# Patient Record
Sex: Female | Born: 1945 | Race: White | Hispanic: No | Marital: Married | Smoking: Former smoker
Health system: Southern US, Community
[De-identification: ages and names within clinical notes are randomized; demographics above are authoritative.]

## PROBLEM LIST (undated history)

## (undated) ENCOUNTER — Emergency Department (HOSPITAL_COMMUNITY): Admission: EM | Payer: Medicare Other | Source: Home / Self Care

## (undated) DIAGNOSIS — J984 Other disorders of lung: Secondary | ICD-10-CM

## (undated) DIAGNOSIS — M1990 Unspecified osteoarthritis, unspecified site: Secondary | ICD-10-CM

## (undated) DIAGNOSIS — R351 Nocturia: Secondary | ICD-10-CM

## (undated) DIAGNOSIS — Z841 Family history of disorders of kidney and ureter: Secondary | ICD-10-CM

## (undated) DIAGNOSIS — N183 Chronic kidney disease, stage 3 (moderate): Secondary | ICD-10-CM

## (undated) DIAGNOSIS — K5790 Diverticulosis of intestine, part unspecified, without perforation or abscess without bleeding: Secondary | ICD-10-CM

## (undated) DIAGNOSIS — I10 Essential (primary) hypertension: Secondary | ICD-10-CM

## (undated) DIAGNOSIS — K219 Gastro-esophageal reflux disease without esophagitis: Secondary | ICD-10-CM

## (undated) DIAGNOSIS — C3410 Malignant neoplasm of upper lobe, unspecified bronchus or lung: Secondary | ICD-10-CM

## (undated) DIAGNOSIS — Z860100 Personal history of colon polyps, unspecified: Secondary | ICD-10-CM

## (undated) DIAGNOSIS — J90 Pleural effusion, not elsewhere classified: Secondary | ICD-10-CM

## (undated) DIAGNOSIS — R918 Other nonspecific abnormal finding of lung field: Secondary | ICD-10-CM

## (undated) DIAGNOSIS — R42 Dizziness and giddiness: Secondary | ICD-10-CM

## (undated) DIAGNOSIS — E785 Hyperlipidemia, unspecified: Secondary | ICD-10-CM

## (undated) HISTORY — DX: Other nonspecific abnormal finding of lung field: R91.8

## (undated) HISTORY — PX: COLONOSCOPY: SHX174

## (undated) HISTORY — PX: TRACHEOSTOMY: SUR1362

## (undated) HISTORY — DX: Pleural effusion, not elsewhere classified: J90

## (undated) HISTORY — PX: ABDOMINAL HYSTERECTOMY: SHX81

## (undated) HISTORY — DX: Chronic kidney disease, stage 3 (moderate): N18.3

## (undated) HISTORY — DX: Essential (primary) hypertension: I10

## (undated) HISTORY — DX: Hyperlipidemia, unspecified: E78.5

## (undated) HISTORY — DX: Other disorders of lung: J98.4

---

## 1999-09-07 ENCOUNTER — Encounter: Admission: RE | Admit: 1999-09-07 | Discharge: 1999-09-07 | Payer: Self-pay | Admitting: Neurosurgery

## 1999-09-07 ENCOUNTER — Encounter: Payer: Self-pay | Admitting: Neurosurgery

## 2001-04-30 ENCOUNTER — Encounter: Payer: Self-pay | Admitting: Internal Medicine

## 2001-04-30 ENCOUNTER — Ambulatory Visit (HOSPITAL_COMMUNITY): Admission: RE | Admit: 2001-04-30 | Discharge: 2001-04-30 | Payer: Self-pay | Admitting: Internal Medicine

## 2001-06-20 ENCOUNTER — Encounter: Admission: RE | Admit: 2001-06-20 | Discharge: 2001-06-20 | Payer: Self-pay | Admitting: Internal Medicine

## 2001-06-20 ENCOUNTER — Encounter: Payer: Self-pay | Admitting: Internal Medicine

## 2006-07-03 ENCOUNTER — Encounter: Admission: RE | Admit: 2006-07-03 | Discharge: 2006-07-03 | Payer: Self-pay | Admitting: Internal Medicine

## 2008-06-04 ENCOUNTER — Encounter: Admission: RE | Admit: 2008-06-04 | Discharge: 2008-06-04 | Payer: Self-pay | Admitting: Internal Medicine

## 2008-08-27 ENCOUNTER — Encounter: Admission: RE | Admit: 2008-08-27 | Discharge: 2008-08-27 | Payer: Self-pay | Admitting: Internal Medicine

## 2009-06-29 HISTORY — PX: CARDIOVASCULAR STRESS TEST: SHX262

## 2011-11-30 ENCOUNTER — Encounter: Payer: Self-pay | Admitting: *Deleted

## 2011-11-30 ENCOUNTER — Other Ambulatory Visit: Payer: Self-pay | Admitting: *Deleted

## 2012-05-30 ENCOUNTER — Other Ambulatory Visit: Payer: Self-pay | Admitting: Internal Medicine

## 2012-05-30 DIAGNOSIS — N390 Urinary tract infection, site not specified: Secondary | ICD-10-CM

## 2012-06-06 ENCOUNTER — Ambulatory Visit
Admission: RE | Admit: 2012-06-06 | Discharge: 2012-06-06 | Disposition: A | Discharge status: Home / Self Care | Payer: Medicare Other | Source: Ambulatory Visit | Attending: Internal Medicine | Admitting: Internal Medicine

## 2012-06-06 DIAGNOSIS — N390 Urinary tract infection, site not specified: Secondary | ICD-10-CM

## 2013-03-19 ENCOUNTER — Encounter: Payer: Self-pay | Admitting: Cardiovascular Disease

## 2013-06-12 ENCOUNTER — Ambulatory Visit
Admission: RE | Admit: 2013-06-12 | Discharge: 2013-06-12 | Disposition: A | Discharge status: Home / Self Care | Payer: BC Managed Care – PPO | Source: Ambulatory Visit | Attending: Internal Medicine | Admitting: Internal Medicine

## 2013-06-12 ENCOUNTER — Other Ambulatory Visit: Payer: Self-pay | Admitting: Internal Medicine

## 2013-06-12 DIAGNOSIS — R079 Chest pain, unspecified: Secondary | ICD-10-CM

## 2013-06-12 DIAGNOSIS — R0602 Shortness of breath: Secondary | ICD-10-CM

## 2013-06-12 DIAGNOSIS — R2689 Other abnormalities of gait and mobility: Secondary | ICD-10-CM

## 2013-06-12 DIAGNOSIS — G44009 Cluster headache syndrome, unspecified, not intractable: Secondary | ICD-10-CM

## 2013-06-13 ENCOUNTER — Ambulatory Visit
Admission: RE | Admit: 2013-06-13 | Discharge: 2013-06-13 | Disposition: A | Discharge status: Home / Self Care | Payer: BC Managed Care – PPO | Source: Ambulatory Visit | Attending: Internal Medicine | Admitting: Internal Medicine

## 2013-06-13 DIAGNOSIS — G44009 Cluster headache syndrome, unspecified, not intractable: Secondary | ICD-10-CM

## 2013-06-13 DIAGNOSIS — R2689 Other abnormalities of gait and mobility: Secondary | ICD-10-CM

## 2013-06-19 ENCOUNTER — Ambulatory Visit
Admission: RE | Admit: 2013-06-19 | Discharge: 2013-06-19 | Disposition: A | Discharge status: Home / Self Care | Payer: BC Managed Care – PPO | Source: Ambulatory Visit | Attending: Internal Medicine | Admitting: Internal Medicine

## 2013-06-19 ENCOUNTER — Other Ambulatory Visit: Payer: Self-pay | Admitting: Internal Medicine

## 2013-06-19 ENCOUNTER — Other Ambulatory Visit: Payer: Medicare Other

## 2013-06-19 DIAGNOSIS — R222 Localized swelling, mass and lump, trunk: Secondary | ICD-10-CM

## 2013-06-19 MED ORDER — IOHEXOL 300 MG/ML  SOLN
75.0000 mL | Freq: Once | INTRAMUSCULAR | Status: AC | PRN
  Administered 2013-06-19: 75 mL via INTRAVENOUS

## 2013-06-24 ENCOUNTER — Encounter: Payer: Self-pay | Admitting: *Deleted

## 2013-06-24 ENCOUNTER — Encounter: Payer: Self-pay | Admitting: Cardiothoracic Surgery

## 2013-06-24 ENCOUNTER — Institutional Professional Consult (permissible substitution) (INDEPENDENT_AMBULATORY_CARE_PROVIDER_SITE_OTHER): Payer: Medicare Other | Admitting: Cardiothoracic Surgery

## 2013-06-24 ENCOUNTER — Other Ambulatory Visit: Payer: Self-pay | Admitting: *Deleted

## 2013-06-24 VITALS — BP 181/80 | HR 85 | Resp 16 | Ht 66.0 in | Wt 135.0 lb

## 2013-06-24 DIAGNOSIS — I119 Hypertensive heart disease without heart failure: Secondary | ICD-10-CM | POA: Insufficient documentation

## 2013-06-24 DIAGNOSIS — R911 Solitary pulmonary nodule: Secondary | ICD-10-CM

## 2013-06-24 DIAGNOSIS — J984 Other disorders of lung: Secondary | ICD-10-CM

## 2013-06-24 DIAGNOSIS — E785 Hyperlipidemia, unspecified: Secondary | ICD-10-CM | POA: Insufficient documentation

## 2013-06-24 DIAGNOSIS — R918 Other nonspecific abnormal finding of lung field: Secondary | ICD-10-CM | POA: Insufficient documentation

## 2013-06-24 DIAGNOSIS — C3410 Malignant neoplasm of upper lobe, unspecified bronchus or lung: Secondary | ICD-10-CM | POA: Insufficient documentation

## 2013-06-24 NOTE — Patient Instructions (Signed)
Pulmonary Nodule A pulmonary nodule is a small, round growth of tissue in the lung. Pulmonary nodules can range in size from less than 1/5 inch (4 mm) to a little bigger than an inch (25 mm). Most pulmonary nodules are detected when imaging tests of the lung are being performed for a different problem. Pulmonary nodules are usually not cancerous (benign). However, some pulmonary nodules are cancerous (malignant). Follow-up treatment or testing is based on the size of the pulmonary nodule and your risk of getting lung cancer.  CAUSES Benign pulmonary nodules can be caused by various things. Some of the causes include:   Bacterial, fungal, or viral infections. This is usually an old infection that is no longer active, but it can sometimes be a current, active infection.  A benign mass of tissue.  Inflammation from conditions such as rheumatoid arthritis.   Abnormal blood vessels in the lungs. Malignant pulmonary nodules can result from lung cancer or from cancers that spread to the lung from other places in the body. SIGNS AND SYMPTOMS Pulmonary nodules usually do not cause symptoms. DIAGNOSIS Most often, pulmonary nodules are found incidentally when an X-ray or CT scan is performed to look for some other problem in the lung area. To help determine whether a pulmonary nodule is benign or malignant, your health care provider will take a medical history and order a variety of tests. Tests done may include:   Blood tests.  A skin test called a tuberculin test. This test is used to determine if you have been exposed to the germ that causes tuberculosis.   Chest X-rays. If possible, a new X-ray may be compared with X-rays you have had in the past.   CT scan. This test shows smaller pulmonary nodules more clearly than an X-ray.   Positron emission tomography (PET) scan. In this test, a safe amount of a radioactive substance is injected into the bloodstream. Then, the scan takes a picture of  the pulmonary nodule. The radioactive substance is eliminated from your body in your urine.   Biopsy. A tiny piece of the pulmonary nodule is removed so it can be checked under a microscope. TREATMENT  Pulmonary nodules that are benign normally do not require any treatment because they usually do not cause symptoms or breathing problems. Your health care provider may want to monitor the pulmonary nodule through follow-up CT scans. The frequency of these CT scans will vary based on the size of the nodule and the risk factors for lung cancer. For example, CT scans will need to be done more frequently if the pulmonary nodule is larger and if you have a history of smoking and a family history of cancer. Further testing or biopsies may be done if any follow-up CT scan shows that the size of the pulmonary nodule has increased. HOME CARE INSTRUCTIONS  Only take over-the-counter or prescription medicines as directed by your health care provider.  Keep all follow-up appointments with your health care provider. SEEK MEDICAL CARE IF:  You have trouble breathing when you are active.   You feel sick or unusually tired.   You do not feel like eating.   You lose weight without trying to.   You develop chills or night sweats.  SEEK IMMEDIATE MEDICAL CARE IF:  You cannot catch your breath, or you begin wheezing.   You cannot stop coughing.   You cough up blood.   You become dizzy or feel like you are going to pass out.   You   have sudden chest pain.   You have a fever or persistent symptoms for more than 2 3 days.   You have a fever and your symptoms suddenly get worse. MAKE SURE YOU:  Understand these instructions.  Will watch your condition.  Will get help right away if you are not doing well or get worse. Document Released: 05/22/2009 Document Revised: 03/27/2013 Document Reviewed: 01/14/2013 [facility] Patient Information 2014 [place], [place].  Lung Cancer Lung cancer is a  tumor which starts as a growth in your lungs. Cancer is a group of many related diseases that begin in cells, the building blocks of the body. Normally, cells grow and divide to produce more cells only when the body needs them. Sometimes cells keep dividing when new cells are not needed. These extra cells may form a mass of tissue called a growth or tumor. Tumors can be either benign (not cancerous) or malignant (cancerous). Cancer can begin in any organ or tissue of the body. The original tumor (where the tumor started out) is called the primary cancer and is usually named for where it begins.  Lung cancer is the most common cause of cancer death in men and women. There are several different types of lung cancers. Usually, lung cancer is described as either small-cell lung cancer or non-small-cell lung cancer. Other types of cancer occur in the lungs, including carcinoid and cancers spread from other organs. The types of cancer have different behavior and treatment. CAUSES  This cancer usually starts when the lungs are exposed to harmful chemicals. When you quit smoking, your risk of lung cancer falls each year (but is never the same as a person who has never smoked).  Other risks include:   Radon gas exposure.  Asbestos and other industrial substance exposure.  Second hand tobacco smoke.  Air pollution.  Family or personal history of lung cancer.  Age over 41. SYMPTOMS  Lung cancer can cause many symptoms. They depend on the type of cancer, its location and other factors. Symptoms of lung cancer can include:  Cough (either new, different or more severe).  Shortness of breath.  Coughing up blood (hemoptysis).  Chest pain.  Hoarseness.  Swelling of the face.  Drooping eyelid.  Changes in blood tests: low sodium (hyponatremia), high calcium (hypercalcemia) or low blood count (anemia).  Weight loss. In its early stages, lung cancer may not have symptoms and can be discovered by  accident. Many of the symptoms above can be caused by diseases other than lung cancer. DIAGNOSIS  In early lung cancer, the patient often does not notice problems. It usually has spread by the time problems are first noticed. Your caregiver may suspect lung cancer based on your symptoms, your exam or based on tests (such as x-rays) obtained for other reasons. Common tests that help your caregiver diagnose your condition include:  Chest x-ray.  CT scan of the lungs and chest.  Blood tests. If a tumor is found, a biopsy will be necessary to confirm that cancer is present and to determine the type of cancer. TREATMENT   Surgery offers a hope for a cure if the cancer has not spread and the cancer is not a small cell (oat cell) cancer of the lung. Surgery cannot cure the small cell type of cancer.  Radiation Therapy is a form of high energy X-ray that helps slow or kill the cancer. It is often used along with medications (chemotherapy) to help treat the cancer and control pain.  Chemotherapy is  used in combination with surgery in advanced cancer. It is also used in all small cell cancers.  Many new treatments look promising.  Your caregiver can give you more information and discuss treatment options that are best for your type of cancer. HOME CARE INSTRUCTIONS   If you smoke, stop!  Take all medications as told.  Keep all appointments with your caregiver and other specialists.  Ask your caregiver if you should see a cancer specialist, if that has not been arranged.  If you require oxygen or breathing equipment, be sure you know how to use it and who to call with questions.  Follow any special diet directions. If you have problems with appetite, ask your caregiver for help. SEEK MEDICAL CARE IF:   You have had a surgical procedure are you are having trouble recovering.  You have ongoing weight loss.  You have decreased strength or energy past the point when your caregiver said you  would feel better.  You develop nausea or lightheadedness.  You have pain that is not improving. SEEK IMMEDIATE MEDICAL CARE IF:   You cough up clotted blood or bright red blood.  Your pain is uncontrolled.  You develop new difficulty breathing or chest pain.  You develop swelling in one or both ankles or legs, or swelling in your face or neck.  You develop new headache or confusion. Document Released: 10/31/2000 Document Revised: 10/17/2011 Document Reviewed: 08/11/2008 [facility] Patient Information 2014 [place], [place].

## 2013-06-24 NOTE — Progress Notes (Signed)
[address]       [place] [address]             [phone]                    Jazma Pickel [facility] [place] Medical Record #[id number] Date of Birth: 1946/03/31  Referring: Burton Apley, MD Primary Care: Burton Apley, MD  Chief Complaint:    Chief Complaint  Patient presents with  . Lung Mass    EVAL AND TREAT...CT CHEST    History of Present Illness:    [name] 67 y.o. female is seen in the office  today for abnormal chest x-ray with right upper lobe lung mass with cavitary lesion. The patient is a former smoker but has not smoked for more than 35 years. She saw Dr. Su Hilt for her annual physical and mentioned some left back pain leading to a chest x-ray. The patient has noted some increasing fatigue over the past several months. She has had a 5 pound weight loss over the last 3 months and possibly 10 pounds over 6 months. She has no history of tuberculosis or exposure to known patients with tuberculosis. She has had no hemoptysis. She's had no fever or chills.  CT scan of the chest was done Friday and the patient is referred to the office for further evaluation and treatment.    Current Activity/ Functional Status:  Patient is independent with mobility/ambulation, transfers, ADL's, IADL's.  Zubrod Score: At the time of surgery this patient's most appropriate activity status/level should be described as: []  Normal activity, no symptoms [x]  Symptoms, fully ambulatory []  Symptoms, in bed less than or equal to 50% of the time []  Symptoms, in bed greater than 50% of the time but less than 100% []  Bedridden []  Moribund   Past Medical History  Diagnosis Date  . Hyperlipidemia   . Hypertension   . Cavitating mass in right upper lung lobe   . Lung nodules     right upper lobe    Past Surgical History  Procedure Laterality Date  . Cardiovascular stress test  06/29/2009    EF 79%, NO ISCHEMIA  . 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 Since then she's had no difficulty with her speech or respiratory status  Family History  Problem Relation Age of Onset  . Heart attack Father   . Cancer Brother     stomach    History   Social History  . Marital Status: married    Spouse Name: N/A    Number of Children:  two  . Years of Education: N/A   Occupational History  .  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 She denies any asbestos exposure    Social History Main Topics  . Smoking status: Former Smoker -- 1.00 packs/day for 17 years    Types: Cigarettes    Quit date: 11/29/1981  . Smokeless tobacco: Never Used  . Alcohol Use: Yes     Comment: 3 BEERS DAILY  . Drug Use: No  . Sexual Activity: Not on file     History  Smoking status  . Former Smoker -- 1.00 packs/day for 17 years  . Types: Cigarettes  . Quit date: 11/29/1981  Smokeless tobacco  . Never Used    History  Alcohol Use  . Yes    Comment: 3 BEERS DAILY     No Known Allergies  Current Outpatient Prescriptions  Medication Sig Dispense Refill  . Calcium Carbonate-Vitamin D (CALCIUM + D PO) Take by mouth.      . Cholecalciferol (VITAMIN D-3 PO) Take by mouth daily.      Marland Kitchen esomeprazole (NEXIUM) 40 MG capsule Take 40 mg by mouth daily before breakfast.      . Multiple Vitamins-Minerals (PRESERVISION AREDS PO) Take by mouth daily.      . simvastatin (ZOCOR) 20 MG tablet Take 20 mg by mouth every evening.      Marland Kitchen amLODipine (NORVASC) 5 MG tablet Take 5 mg by mouth daily.      . chlorthalidone (HYGROTON) 25 MG tablet Take 25 mg by mouth daily.       No current facility-administered medications for this visit.      Review of Systems:     Cardiac Review of Systems: Y or N  Chest Pain [  N  ]  Resting SOB [  N ] Exertional SOB  [ N ]  Orthopnea [ N ]   Pedal Edema [   N ]    Palpitations N  ] Syncope  Klaus.Mock  ]   Presyncope [ N ]  General Review of Systems: [Y] = yes [  ]=no Constitional: recent weight change [  ];  Wt loss over the last 3 months [ 5  ] anorexia [  ]; fatigue [Y  ]; nausea [  ]; night sweats [  ]; fever [ N ]; or chills [  ];          Dental: poor dentition[  ]; Last Dentist visit:   Eye : blurred vision [  ]; diplopia [   ]; vision changes [  ];  Amaurosis fugax[  ]; Resp: cough Klaus.Mock  ];  wheezing[ N ];  hemoptysis[ N ]; shortness of breath[  ]; paroxysmal nocturnal dyspnea[  ]; dyspnea on exertion[ N ]; or orthopnea[  ];  GI:  gallstones[  ], vomiting[  ];  dysphagia[  ]; melena[  ];  hematochezia [  ]; heartburn[  ];   Hx of  Colonoscopy[ Y ]; GU: kidney stones [  ]; hematuria[  ];   dysuria [  ];  nocturia[  ];  history of     obstruction [  ]; urinary frequency [  ]             Skin: rash, swelling[  ];, hair loss[  ];  peripheral edema[  ];  or itching[  ]; Musculosketetal: myalgias[  ];  joint swelling[  ];  joint erythema[  ];  joint pain[  ];  back pain[  ];  Heme/Lymph: bruising[ N ];  bleeding[ N ];  anemia[  ];  Neuro: TIA[ N ];  headaches[  ];  stroke[  ];  vertigo[  ];  seizures[  ];   paresthesias[ N ];  difficulty walking[ N ];  Psych:depression[N  ]; anxiety[ N ];  Endocrine: diabetes[  ];  thyroid dysfunction[  ];  Immunizations: Flu up to date [ Y ]; Pneumococcal up to date Gilian.Kraft  ];  Other: Patient has had a history of "burning sensation" on the right side of her head, also complains of "bad back" for many years  Physical Exam: BP 181/80  Pulse 85  Resp 16  Ht 5\' 6"  (1.676 m)  Wt 135 lb (61.236 kg)  BMI 21.80 kg/m2  SpO2 96%    General appearance: alert, cooperative, appears stated age and no distress Neurologic: intact Heart: regular rate and rhythm, S1, S2 normal, no murmur, click, rub or gallop and normal apical impulse Lungs: clear to auscultation bilaterally and normal percussion bilaterally Abdomen: soft,  non-tender; bowel sounds normal; no masses,  no organomegaly Extremities: extremities normal, atraumatic, no cyanosis or edema, Homans sign is negative, no sign of DVT and no ulcers, gangrene or trophic changes PATIENT HAS NO CAROTID BRUITS, no cervical or supraclavicular adenopathy. She does have a well healed old scar from previous tracheostomy   Diagnostic Studies & Laboratory data:     Recent Radiology Findings:   Dg Chest 2 View  06/12/2013   CLINICAL DATA:  Chest pain and shortness of breath.  EXAM: CHEST  2 VIEW  COMPARISON:  07/03/2006  FINDINGS: There is a cavitary 3.4 x 3.9 cm mass in the posterior aspect of the right upper lobe worrisome for carcinoma. Nipple shadows of both bases. Lungs are otherwise clear. Moderate thoracolumbar scoliosis. Heart size and vascularity are normal.  IMPRESSION: Cavitary mass in the right upper lobe worrisome for malignancy. CT scan of the chest with contrast is recommended for further evaluation.   Electronically Signed   By: Geanie Cooley M.D.   On: 06/12/2013 12:29   Ct Head Wo Contrast  06/13/2013   CLINICAL DATA:  Dizziness. Multiple falls.  EXAM: CT HEAD WITHOUT CONTRAST  TECHNIQUE: Contiguous axial images were obtained from the base of the skull through the vertex without intravenous contrast.  COMPARISON:  06/04/2008  FINDINGS: Atherosclerotic and physiologic intracranial calcifications. Mild atrophy. There is no evidence of acute intracranial hemorrhage, brain edema, mass lesion, acute infarction, mass effect, or midline shift. Acute infarct may be inapparent on noncontrast CT. No other intra-axial abnormalities are seen, and the ventricles and sulci are within normal limits in size and symmetry. No abnormal extra-axial fluid collections or masses are identified. No significant calvarial abnormality.  IMPRESSION: No bleed or other acute intracranial process.   Electronically Signed   By: Oley Balm M.D.   On: 06/13/2013 13:21   Ct Chest W  Contrast  06/19/2013   CLINICAL DATA:  Cavitary mass seen in the right upper lobe on chest radiograph performed 06/12/2013. Chest CT was recommended. Patient is a former smoker.  EXAM: CT CHEST WITH CONTRAST  TECHNIQUE: Multidetector CT imaging of the chest was performed during intravenous contrast administration.  CONTRAST:  75mL OMNIPAQUE IOHEXOL 300 MG/ML  SOLN  COMPARISON:  Chest radiograph 06/12/2013  FINDINGS: No supraclavicular, mediastinal, hilar, or axillary lymphadenopathy is identified. Thyroid gland and thoracic inlet appear normal. Thoracic aorta is normal in caliber. There is mild atherosclerotic calcification of the proximal ascending thoracic aorta. Heart size is slightly prominent, with left atrial dilatation noted. The esophagus is unremarkable. Negative for pleural or pericardial effusion.  The lung windows demonstrate a background of moderate centrilobular emphysema with some bullous changes in the right upper lobe.  In the right upper lobe is an irregular spiculated mass with central high density and peripheral ground-glass attenuation. There are cavitary changes seen along the posterior superior margin of this mass. Soft tissue mass measures approximately 2.6 x 2.5 x 3.5 cm. If the cavitary (versus pre-existing bullous) changes posterior to the soft tissue mass are included it measures approximately 3.2 x 2.5 x 3.5 cm total.  In the right upper lobe adjacent  to the major fissure is a faint nodule measuring 3 mm on image number 23 of lung windows. More anteriorly in the right upper lobe is a faint 4 mm nodule on image number 26.  No nodule or airspace disease is identified in the left lung. The trachea mainstem bronchi are patent.  There are multilevel degenerative changes of the thoracic spine. There is a central superior endplate mild compression of the T9 vertebral body. No suspicious osseous lesion is identified.  The adrenal glands are normal. The imaged portion of the liver, spleen,  pancreas, and left kidney are within normal limits. Focal area of scarring noted in the upper pole of the right kidney posteriorly.  IMPRESSION: 1. Findings highly suspicious for primary bronchogenic carcinoma of the right upper lobe. Soft tissue component of the mass measures 2.6 x 2.5 x 3.5 cm. PET-CT should be considered for further evaluation. 2. 2 faint pulmonary nodules in the right upper lobe measures 3 and 4 mm respectively, for which metastatic pulmonary of cannot be excluded. 3. Moderate centrilobular emphysema. 4. Central superior endplate mild compression deformity of the T9 vertebral body.   Electronically Signed   By: Britta Mccreedy M.D.   On: 06/19/2013 20:49      Recent Lab Findings: No results found for this basename: WBC, HGB, HCT, PLT, GLUCOSE, CHOL, TRIG, HDL, LDLDIRECT, LDLCALC, ALT, AST, NA, K, CL, CREATININE, BUN, CO2, TSH, INR, GLUF, HGBA1C      Assessment / Plan:   #1 CT chest findings of a 3.5 cm right upper lobe lung mass with cavitary component suspicious for malignancy #2 right upper lobe lung nodules x2, 3 mm and 4 mm #3 CT evidence of emphysema #4 previous history of tracheostomy at age 47 #5 hyperlipidemia treated #6 history of hypertension    I have seen the patient and reviewed with her and her husband the findings of the CT scan and the possible diagnosis of carcinoma the lung. I recommended that we proceed with a PET scan and pulmonary function studies to further evaluate right upper lobe lung mass and small secondary right upper lobe nodules. I'll plan to see the patient back quickly after the above studies are completed.   I spent 50 minutes counseling the patient face to face. The total time spent in the appointment was 60 minutes.  Delight Ovens MD      [address] [place] [place] [address] Office [phone]   Beeper [phone]  06/24/2013 4:00 PM

## 2013-07-01 ENCOUNTER — Encounter (HOSPITAL_COMMUNITY)
Admission: RE | Admit: 2013-07-01 | Discharge: 2013-07-01 | Disposition: A | Discharge status: Home / Self Care | Payer: Medicare Other | Source: Ambulatory Visit | Attending: Cardiothoracic Surgery | Admitting: Cardiothoracic Surgery

## 2013-07-01 ENCOUNTER — Ambulatory Visit (HOSPITAL_COMMUNITY)
Admission: RE | Admit: 2013-07-01 | Discharge: 2013-07-01 | Disposition: A | Discharge status: Home / Self Care | Payer: Medicare Other | Source: Ambulatory Visit | Attending: Cardiothoracic Surgery | Admitting: Cardiothoracic Surgery

## 2013-07-01 DIAGNOSIS — J438 Other emphysema: Secondary | ICD-10-CM | POA: Insufficient documentation

## 2013-07-01 DIAGNOSIS — R911 Solitary pulmonary nodule: Secondary | ICD-10-CM

## 2013-07-01 DIAGNOSIS — Z87891 Personal history of nicotine dependence: Secondary | ICD-10-CM | POA: Insufficient documentation

## 2013-07-01 DIAGNOSIS — R0609 Other forms of dyspnea: Secondary | ICD-10-CM | POA: Insufficient documentation

## 2013-07-01 DIAGNOSIS — J984 Other disorders of lung: Secondary | ICD-10-CM | POA: Insufficient documentation

## 2013-07-01 DIAGNOSIS — R0989 Other specified symptoms and signs involving the circulatory and respiratory systems: Secondary | ICD-10-CM | POA: Insufficient documentation

## 2013-07-01 DIAGNOSIS — C3410 Malignant neoplasm of upper lobe, unspecified bronchus or lung: Secondary | ICD-10-CM | POA: Insufficient documentation

## 2013-07-01 LAB — PULMONARY FUNCTION TEST
DL/VA % pred: 78 %
DL/VA: 4.02 ml/min/mmHg/L
DLCO cor % pred: 73 %
DLCO cor: 20.87 ml/min/mmHg
DLCO unc % pred: 73 %
DLCO unc: 20.87 ml/min/mmHg
FEF 25-75 Post: 1.85 L/sec
FEF 25-75 Pre: 1.58 L/sec
FEF2575-%Change-Post: 16 %
FEF2575-%Pred-Post: 85 %
FEF2575-%Pred-Pre: 73 %
FEV1-%Change-Post: 3 %
FEV1-%Pred-Post: 94 %
FEV1-%Pred-Pre: 91 %
FEV1-Post: 2.48 L
FEV1-Pre: 2.4 L
FEV1FVC-%Change-Post: 0 %
FEV1FVC-%Pred-Pre: 92 %
FEV6-%Change-Post: 3 %
FEV6-%Pred-Post: 106 %
FEV6-%Pred-Pre: 103 %
FEV6-Post: 3.5 L
FEV6-Pre: 3.39 L
FEV6FVC-%Change-Post: 0 %
FEV6FVC-%Pred-Post: 103 %
FEV6FVC-%Pred-Pre: 102 %
FVC-%Change-Post: 2 %
FVC-%Pred-Post: 102 %
FVC-%Pred-Pre: 99 %
FVC-Post: 3.51 L
FVC-Pre: 3.43 L
Post FEV1/FVC ratio: 71 %
Post FEV6/FVC ratio: 100 %
Pre FEV1/FVC ratio: 70 %
Pre FEV6/FVC Ratio: 99 %
RV % pred: 95 %
RV: 2.19 L
TLC % pred: 102 %
TLC: 5.63 L

## 2013-07-01 LAB — GLUCOSE, CAPILLARY: Glucose-Capillary: 108 mg/dL — ABNORMAL HIGH (ref 70–99)

## 2013-07-01 MED ORDER — ALBUTEROL SULFATE (5 MG/ML) 0.5% IN NEBU
2.5000 mg | INHALATION_SOLUTION | Freq: Once | RESPIRATORY_TRACT | Status: AC
  Administered 2013-07-01: 2.5 mg via RESPIRATORY_TRACT

## 2013-07-01 MED ORDER — FLUDEOXYGLUCOSE F - 18 (FDG) INJECTION
17.7000 | Freq: Once | INTRAVENOUS | Status: AC | PRN
  Administered 2013-07-01: 17.7 via INTRAVENOUS

## 2013-07-02 ENCOUNTER — Encounter (HOSPITAL_COMMUNITY): Payer: Self-pay | Admitting: Pharmacy Technician

## 2013-07-02 ENCOUNTER — Ambulatory Visit (INDEPENDENT_AMBULATORY_CARE_PROVIDER_SITE_OTHER): Payer: Medicare Other | Admitting: Cardiothoracic Surgery

## 2013-07-02 ENCOUNTER — Other Ambulatory Visit: Payer: Self-pay | Admitting: *Deleted

## 2013-07-02 ENCOUNTER — Encounter: Payer: Self-pay | Admitting: Cardiothoracic Surgery

## 2013-07-02 VITALS — BP 178/88 | HR 85 | Resp 20 | Ht 66.0 in | Wt 135.0 lb

## 2013-07-02 DIAGNOSIS — J984 Other disorders of lung: Secondary | ICD-10-CM

## 2013-07-02 DIAGNOSIS — R918 Other nonspecific abnormal finding of lung field: Secondary | ICD-10-CM

## 2013-07-02 NOTE — Patient Instructions (Signed)
Lung Resection A lung resection is surgery to remove a lung. When an entire lung is removed, the procedure is called a pneumonectomy. When only part of a lung is removed, the procedure is called a lobectomy. A lung resection is typically done to get rid of a tumor or cancer. This surgery can help relieve some or all of your symptoms. The surgery can also help keep the problem from getting worse. It may provide the best chance for curing your disease. However, surgery may not necessarily cure lung cancer, if that is the problem. Most people need to stay in the hospital for several days after this procedure.  LET YOUR CAREGIVER KNOW ABOUT:  Allergies to food or medicine.  Medicines taken, including vitamins, herbs, eyedrops, over-the-counter medicines, and creams.  Use of steroids (by mouth or creams).  Previous problems with anesthetics or numbing medicines.  History of bleeding problems or blood clots.  Previous surgery.  Other health problems, including diabetes and kidney problems.  Possibility of pregnancy, if this applies. RISKS AND COMPLICATIONS  Lung resections have been done for many years with good results and few complications. However, all surgery is associated with possible risks. Some of these risks are:  Excessive bleeding.  Infection.  Inability to breath without a ventilator.  Persistent shortness of breath.  Heart problems, including abnormal rhythms and a risk of heart attack or heart failure.  Blood clots.  Injury to a blood vessel.  Injury to a nerve.  Failure to heal properly.  Stroke.  Bronchopleural fistula. This is a small hole between one of the main breathing tubes and the lining of the lungs. BEFORE THE PROCEDURE  In order to prepare for surgery, your caregiver may ask for several tests to be done. These may include:  Blood tests.  Urine tests.  X-rays.  Imaging tests, such as CT scans, MRI scans, and PET scans. These tests are done to  find the exact size and location of the tumor that will be removed.  Pulmonary function tests (PFTs). These are breathing tests to assess the function of your lungs before surgery and to decide how to best help your breathing after surgery.  Heart testing. This is done to make sure your heart is strong enough for the procedure.  Bronchoscopy. This is a technique that allows your caregiver to look at the inside of your airways. This is done using a soft, flexible tube (bronchoscope). Along with imaging tests, this can help your caregiver know the exact location and size of the area that will be removed during surgery.  Lymph node sampling. This may need to be done to see if the tumor has spread. It may be done as a separate surgery or right before your lung resection procedure. PROCEDURE  An intravenous line (IV) will be placed in your arm. You will be given medicine that makes you sleep (general anesthetic).  Once you are asleep, a breathing tube is placed into your windpipe. You may also get pain medicine through a thin, flexible tube (catheter) in your back. The catheter is put through your skin and next to your spinal cord, where it releases anesthetic medicine.  Next, you will be turned onto your side. This makes it easier for your surgeon to reach the area of your ribcage where the surgical cut (incision) will be made. This area is washed with a disinfectant solution and might also be shaved. A catheter will be put into your bladder to collect urine. Another tube will be   carefully passed through your throat and into your stomach.  The surgeon will make an incision on your side, which will start between two of your ribs and go around to your back. Your ribs will be spread and held open. Part of one rib may be removed to make it easier for the surgeon to reach your lung.  Your surgeon will carefully cut the veins, arteries, and bronchus leading to the lung. After being cut, each of these pieces  will be sewn or stapled closed. Then, the lung or part of the lung will be removed.  Your surgeon will check inside your chest to make sure there is no bleeding in or around the lungs. Lymph nodes near the lung may also be removed for later tests. This is done to check if your problems have spread to the lymph nodes.  Depending on your situation, your surgeon may put tubes into your chest to drain extra fluid and air from the chest cavity after surgery. After the tubes are in, your ribcage will be closed with stitches. The stitches help your ribcage heal and keep it from moving. After this, the layers of tissue under the skin are closed with more stitches, which will dissolve inside your body over time. Finally, your skin is closed with stitches or staples and covered with a bandage. AFTER THE PROCEDURE   After surgery, you will be taken to the recovery area where a nurse will monitor your progress. You may still have a breathing tube, spinal catheter, bladder catheter, stomach tube, and possibly chest tubes inside your body. These will be removed during your recovery. You may be put on a respirator following surgery if some assistance is needed to help your breathing. When you are awake, stable, and without complications, you will likely continue recovery in the intensive care unit (ICU).  As you wake up, you might feel some aches and pains in your chest and throat. Sometimes during recovery, patients may shiver or feel nauseous. Both of these symptoms are temporary and may be caused by the anesthesia. Your caregivers can give you medicine to help these problems go away.  The breathing tube will be taken out as soon as your caregivers feel you can breathe on your own. For most people, this happens on the same day as the surgery.  If your surgery and time in the ICU go well, most of the tubes and equipment will be taken out within the first 1 to 2 days after surgery. This is about how long most people  stay in the ICU. You may need to stay longer, depending on how you are doing.  You should also start respiratory therapy in the ICU. This therapy uses breathing exercises to help your other lung stay healthy and get stronger.  As you improve, you will be moved to a regular hospital room for continued respiratory therapy, help with your bladder and bowels, and to continue medicines. Most people stay in the hospital for 5 to 7 days. However, your stay may be longer, depending on how your surgery went and how well you are doing.  After your lung or part of your lung is taken out, there will be a space inside your chest. This space will often fill up with fluid over time. The amount of time this takes is different for each person. Because your chest needs to fill with fluid, your surgeon may or may not put a drainage tube in your chest. If there is a chest   tube, it will most likely be removed within 24 hours after the surgery.  You will receive care until you are doing well and your caregiver feels it is safe for you to go home or to transfer to an extended care facility. Document Released: 10/15/2002 Document Revised: 10/17/2011 Document Reviewed: 03/24/2011 [employer] Patient Information 2014 [employer]. Lung Resection Care After Refer to this sheet in the next few weeks. These instructions provide you with information on caring for yourself after your procedure. Your caregiver may also give you more specific instructions. Your treatment has been planned according to current medical practices, but problems sometimes occur. Call your caregiver if you have any problems or questions after your procedure. HOME CARE INSTRUCTIONS  You may resume a normal diet and activities as directed.  Do not smoke or use tobacco products.  Change your bandages (dressings) as directed.  Only take over-the-counter or prescription medicines for pain, discomfort, or fever as directed by your caregiver.  Keep all  follow-up appointments as directed.  Try to breathe deeply and cough as directed. Holding a pillow firmly over your ribs may help with discomfort.  If you were given an incentive spirometer in the hospital, continue to use it as directed.  Walk as directed by your caregiver.  You may take a shower and gently wash the area of your surgical cut (incision) with water and soap as directed. Do not use anything else to clean your incision except as directed by your caregiver. Do not take baths or sit in a hot tub. SEEK MEDICAL CARE IF:  You notice redness, swelling, or increasing pain in the incision.  You are bleeding from the incision.  You see pus coming from the incision.  You notice a bad smell coming from the incision or dressing.  Your incision breaks open.  You cough up blood or pus, or you develop a cough that produces bad smelling sputum.  You have pain or swelling in your legs.  You have increasing pain that is not controlled with medicine.  You have trouble managing any of the tubes that have been left in place after surgery. SEEK IMMEDIATE MEDICAL CARE IF:   You have a fever or chills.  You have any reaction or side effects to medicines given.  You have chest pain or an irregular or rapid heartbeat.  You have dizzy episodes or fainting.  You have shortness of breath or difficulty breathing.  You have persistent nausea or vomiting.  You have a rash. MAKE SURE YOU:  Understand these instructions.  Will watch your condition.  Will get help right away if you are not doing well or get worse. Document Released: 02/11/2005 Document Revised: 10/17/2011 Document Reviewed: 03/24/2011 [employer] Patient Information 2014 [employer].   

## 2013-07-02 NOTE — Progress Notes (Signed)
[name] [facility] [place] Medical Record #[id number] Date of Birth: Oct 14, 1945  Referring: Meghan Apley, MD Primary Care: Meghan Apley, MD  Chief Complaint:    Chief Complaint  Patient presents with  . Lung Mass    F/U to discuss PET Scan and PFT's    History of Present Illness:    [name] 67 y.o. female is seen in the office  today in follow up after an  abnormal chest x-ray with right upper lobe lung mass with cavitary lesion. Since last seen PFT and PET scan have been done.  The patient is a former smoker but has not smoked for more than 35 years. She saw Dr. Su [name] for her annual physical and mentioned some left back pain leading to a chest x-ray. The patient has noted some increasing fatigue over the past several months. She has had a 5 pound weight loss over the last 3 months and possibly 10 pounds over 6 months. She has no history of tuberculosis or exposure to known patients with tuberculosis. She has had no hemoptysis. She's had no fever or chills.  CT scan of the chest was donelast week, PETS scan done yesterday.   Current Activity/ Functional Status:  Patient is independent with mobility/ambulation, transfers, ADL's, IADL's.  Zubrod Score: At the time of surgery this patient's most appropriate activity status/level should be described as: []  Normal activity, no symptoms [x]  Symptoms, fully ambulatory []  Symptoms, in bed less than or equal to 50% of the time []  Symptoms, in bed greater than 50% of the time but less than 100% []  Bedridden []  Moribund   Past Medical History  Diagnosis Date  . Hyperlipidemia   . Hypertension   . Cavitating mass in right upper lung lobe   . Lung nodules     right upper lobe    Past Surgical History  Procedure Laterality Date  . Cardiovascular stress test  06/29/2009    EF 79%, NO ISCHEMIA  . Abdominal hysterectomy     patient has a previous history of vocal cord polyps at age 43,  she underwent a tracheostomy for which she had for 2 years. Since then she's had no difficulty with her speech or respiratory status  Family History  Problem Relation Age of Onset  . Heart attack Father   . Cancer Brother     stomach    History   Social History  . Marital Status: married    Spouse Name: N/A    Number of Children:  two  . Years of Education: N/A   Occupational History  .  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 She denies any asbestos exposure    Social History Main Topics  . Smoking status: Former Smoker -- 1.00 packs/day for 17 years    Types: Cigarettes    Quit date: 11/29/1981  . Smokeless tobacco: Never Used  . Alcohol Use: Yes     Comment: 3 BEERS DAILY  . Drug Use: No  . Sexual Activity: Not on file     History  Smoking status  . Former Smoker -- 1.00 packs/day for 17 years  . Types: Cigarettes  . Quit date: 11/29/1981  Smokeless tobacco  . Never Used    History  Alcohol Use  . Yes  Comment: 3 BEERS DAILY     No Known Allergies  Current Outpatient Prescriptions  Medication Sig Dispense Refill  . amLODipine (NORVASC) 5 MG tablet Take 5 mg by mouth daily.      . Calcium Carbonate-Vitamin D (CALCIUM + D PO) Take by mouth.      . chlorthalidone (HYGROTON) 25 MG tablet Take 25 mg by mouth daily.      . Cholecalciferol (VITAMIN D-3 PO) Take by mouth daily.      Marland Kitchen esomeprazole (NEXIUM) 40 MG capsule Take 40 mg by mouth daily before breakfast.      . Multiple Vitamins-Minerals (PRESERVISION AREDS PO) Take by mouth daily.      . simvastatin (ZOCOR) 20 MG tablet Take 20 mg by mouth every evening.       No current facility-administered medications for this visit.      Review of Systems:     Cardiac Review of Systems: Y or N  Chest Pain [  N  ]  Resting SOB [  N ] Exertional SOB  [ N ]  Orthopnea [ N ]   Pedal Edema [  N ]    Palpitations N  ] Syncope  [name]  ]   Presyncope  [ N ]  General Review of Systems: [Y] = yes [  ]=no Constitional: recent weight change [  ];  Wt loss over the last 3 months [ 5  ] anorexia [  ]; fatigue [Y  ]; nausea [  ]; night sweats [  ]; fever [ N ]; or chills [  ];          Dental: poor dentition[  ]; Last Dentist visit:   Eye : blurred vision [  ]; diplopia [   ]; vision changes [  ];  Amaurosis fugax[  ]; Resp: cough [name]  ];  wheezing[ N ];  hemoptysis[ N ]; shortness of breath[  ]; paroxysmal nocturnal dyspnea[  ]; dyspnea on exertion[ N ]; or orthopnea[  ];  GI:  gallstones[  ], vomiting[  ];  dysphagia[  ]; melena[  ];  hematochezia [  ]; heartburn[  ];   Hx of  Colonoscopy[ Y ]; GU: kidney stones [  ]; hematuria[  ];   dysuria [  ];  nocturia[  ];  history of     obstruction [  ]; urinary frequency [  ]             Skin: rash, swelling[  ];, hair loss[  ];  peripheral edema[  ];  or itching[  ]; Musculosketetal: myalgias[  ];  joint swelling[  ];  joint erythema[  ];  joint pain[  ];  back pain[  ];  Heme/Lymph: bruising[ N ];  bleeding[ N ];  anemia[  ];  Neuro: TIA[ N ];  headaches[  ];  stroke[  ];  vertigo[  ];  seizures[  ];   paresthesias[ N ];  difficulty walking[ N ];  Psych:depression[N  ]; anxiety[ N ];  Endocrine: diabetes[  ];  thyroid dysfunction[  ];  Immunizations: Flu up to date [ Y ]; Pneumococcal up to date [name]  ];  Other: Patient has had a history of "burning sensation" on the right side of her head, also complains of "bad back" for many years  Physical Exam: BP 178/88  Pulse 85  Resp 20  Ht 5\' 6"  (1.676 m)  Wt 135 lb (61.236 kg)  BMI 21.80 kg/m2  SpO2 96%  General appearance: alert, cooperative, appears stated age and no distress Neurologic: intact Heart: regular rate and rhythm, S1, S2 normal, no murmur, click, rub or gallop and normal apical impulse Lungs: clear to auscultation bilaterally and normal percussion bilaterally Abdomen: soft, non-tender; bowel sounds normal; no masses,  no  organomegaly Extremities: extremities normal, atraumatic, no cyanosis or edema, Homans sign is negative, no sign of DVT and no ulcers, gangrene or trophic changes PATIENT HAS NO CAROTID BRUITS, no cervical or supraclavicular adenopathy. She does have a well healed old scar from previous tracheostomy   Diagnostic Studies & Laboratory data:     Recent Radiology Findings:  Nm Pet Image Initial (pi) Skull Base To Thigh  07/01/2013   CLINICAL DATA:  Initial treatment strategy for lung mass.  EXAM: NUCLEAR MEDICINE PET SKULL BASE TO THIGH  FASTING BLOOD GLUCOSE:  Value: 108mg /dl  TECHNIQUE: 98.1 mCi X-91 FDG was injected intravenously. CT data was obtained and used for attenuation correction and anatomic localization only. (This was not acquired as a diagnostic CT examination.) Additional exam technical data entered on technologist worksheet.  COMPARISON:  CT chest 06/19/2013.  FINDINGS: NECK  No hypermetabolic lymph nodes in the neck. CT images show no acute findings. Visualized portions of the paranasal sinuses and mastoid air cells appear clear.  CHEST  A mixed solid and ground-glass mass in the posterior right upper lobe measures 2.7 x 3.8 cm. It has ground-glass, solid and cystic components with an SUV max of 8.5. No hypermetabolic mediastinal or hilar lymph nodes.  CT images show scattered atherosclerotic calcification of the arterial vasculature. Heart size normal. No pericardial effusion. Scattered centrilobular emphysema. Sub cm nodules seen in the lungs on 06/19/2013 are too small for PET resolution. Minimal dependent atelectasis. No pleural fluid.  ABDOMEN/PELVIS  No abnormal hypermetabolic activity within the liver, pancreas, adrenal glands or spleen. No hypermetabolic lymph nodes. CT images show mild irregularity the liver margin. Gallbladder, adrenal glands, kidneys, spleen, pancreas, stomach and bowel are grossly unremarkable. No free fluid.  SKELETON  No focal hypermetabolic activity to suggest  skeletal metastasis.  IMPRESSION: 1. 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 2. Question early/mild cirrhosis.   Electronically Signed   By: Leanna Battles M.D.   On: 07/01/2013 15:52    Dg Chest 2 View  06/12/2013   CLINICAL DATA:  Chest pain and shortness of breath.  EXAM: CHEST  2 VIEW  COMPARISON:  07/03/2006  FINDINGS: There is a cavitary 3.4 x 3.9 cm mass in the posterior aspect of the right upper lobe worrisome for carcinoma. Nipple shadows of both bases. Lungs are otherwise clear. Moderate thoracolumbar scoliosis. Heart size and vascularity are normal.  IMPRESSION: Cavitary mass in the right upper lobe worrisome for malignancy. CT scan of the chest with contrast is recommended for further evaluation.   Electronically Signed   By: Geanie Cooley M.D.   On: 06/12/2013 12:29   Ct Head Wo Contrast  06/13/2013   CLINICAL DATA:  Dizziness. Multiple falls.  EXAM: CT HEAD WITHOUT CONTRAST  TECHNIQUE: Contiguous axial images were obtained from the base of the skull through the vertex without intravenous contrast.  COMPARISON:  06/04/2008  FINDINGS: Atherosclerotic and physiologic intracranial calcifications. Mild atrophy. There is no evidence of acute intracranial hemorrhage, brain edema, mass lesion, acute infarction, mass effect, or midline shift. Acute infarct may be inapparent on noncontrast CT. No other intra-axial abnormalities are seen, and the  ventricles and sulci are within normal limits in size and symmetry. No abnormal extra-axial fluid collections or masses are identified. No significant calvarial abnormality.  IMPRESSION: No bleed or other acute intracranial process.   Electronically Signed   By: Oley Balm M.D.   On: 06/13/2013 13:21   Ct Chest W Contrast  06/19/2013   CLINICAL DATA:  Cavitary mass seen in the right upper lobe on chest radiograph performed 06/12/2013. Chest CT was recommended. Patient is  a former smoker.  EXAM: CT CHEST WITH CONTRAST  TECHNIQUE: Multidetector CT imaging of the chest was performed during intravenous contrast administration.  CONTRAST:  75mL OMNIPAQUE IOHEXOL 300 MG/ML  SOLN  COMPARISON:  Chest radiograph 06/12/2013  FINDINGS: No supraclavicular, mediastinal, hilar, or axillary lymphadenopathy is identified. Thyroid gland and thoracic inlet appear normal. Thoracic aorta is normal in caliber. There is mild atherosclerotic calcification of the proximal ascending thoracic aorta. Heart size is slightly prominent, with left atrial dilatation noted. The esophagus is unremarkable. Negative for pleural or pericardial effusion.  The lung windows demonstrate a background of moderate centrilobular emphysema with some bullous changes in the right upper lobe.  In the right upper lobe is an irregular spiculated mass with central high density and peripheral ground-glass attenuation. There are cavitary changes seen along the posterior superior margin of this mass. Soft tissue mass measures approximately 2.6 x 2.5 x 3.5 cm. If the cavitary (versus pre-existing bullous) changes posterior to the soft tissue mass are included it measures approximately 3.2 x 2.5 x 3.5 cm total.  In the right upper lobe adjacent to the major fissure is a faint nodule measuring 3 mm on image number 23 of lung windows. More anteriorly in the right upper lobe is a faint 4 mm nodule on image number 26.  No nodule or airspace disease is identified in the left lung. The trachea mainstem bronchi are patent.  There are multilevel degenerative changes of the thoracic spine. There is a central superior endplate mild compression of the T9 vertebral body. No suspicious osseous lesion is identified.  The adrenal glands are normal. The imaged portion of the liver, spleen, pancreas, and left kidney are within normal limits. Focal area of scarring noted in the upper pole of the right kidney posteriorly.  IMPRESSION: 1. Findings highly  suspicious for primary bronchogenic carcinoma of the right upper lobe. Soft tissue component of the mass measures 2.6 x 2.5 x 3.5 cm. PET-CT should be considered for further evaluation. 2. 2 faint pulmonary nodules in the right upper lobe measures 3 and 4 mm respectively, for which metastatic pulmonary of cannot be excluded. 3. Moderate centrilobular emphysema. 4. Central superior endplate mild compression deformity of the T9 vertebral body.   Electronically Signed   By: Britta Mccreedy M.D.   On: 06/19/2013 20:49      Recent Lab Findings: No results found for this basename: WBC,  HGB,  HCT,  PLT,  GLUCOSE,  CHOL,  TRIG,  HDL,  LDLDIRECT,  LDLCALC,  ALT,  AST,  NA,  K,  CL,  CREATININE,  BUN,  CO2,  TSH,  INR,  GLUF,  HGBA1C   PFT's Done: FEV1 2.40 91 %  DLCO  20.87   73%   Assessment / Plan:   #1 CT chest findings of a 3.5 cm right upper lobe lung mass with cavitary component suspicious for malignancy Hypermetabolic right upper lobe mass is most consistent with primary bronchogenic carcinoma (cT2a cN0 cM0 or stage clinical  1 B disease)  #  2 right upper lobe lung nodules x2, 3 mm and 4 mm #3 CT evidence of emphysema #4 previous history of tracheostomy at age 82 #5 hyperlipidemia treated #6 history of hypertension    I have seen the patient and reviewed with her and her husband the findings of the CT scan and the possible diagnosis of carcinoma the lung. 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 The risks and options of surgical treatment were discussed with the patient in detail, including the risk of death infection stroke myocardial infarction prolonged air leak. I discussed with the patient the clinical stage of what appears to be a primary carcinoma the lung.   The the patient and her husband have had their  questions answered, she is willing to proceed December 1, with bronchoscopy right video-assisted thoracoscopy lung resection and possible lymph node dissection.    Delight Ovens MD      [address] [place].[address] [place] [address] Office [phone]   Beeper [phone]  07/02/2013 2:37 PM

## 2013-07-03 ENCOUNTER — Encounter (HOSPITAL_COMMUNITY): Payer: Self-pay

## 2013-07-03 ENCOUNTER — Encounter (HOSPITAL_COMMUNITY): Admission: RE | Admit: 2013-07-03 | Payer: Medicare Other | Source: Ambulatory Visit

## 2013-07-03 ENCOUNTER — Other Ambulatory Visit: Payer: Self-pay | Admitting: *Deleted

## 2013-07-03 ENCOUNTER — Telehealth: Payer: Self-pay

## 2013-07-03 ENCOUNTER — Encounter (HOSPITAL_COMMUNITY)
Admission: RE | Admit: 2013-07-03 | Discharge: 2013-07-03 | Disposition: A | Discharge status: Home / Self Care | Payer: Medicare Other | Source: Ambulatory Visit | Attending: Cardiothoracic Surgery | Admitting: Cardiothoracic Surgery

## 2013-07-03 VITALS — BP 140/83 | HR 74 | Temp 98.2°F | Resp 20 | Ht 64.0 in | Wt 135.4 lb

## 2013-07-03 DIAGNOSIS — Z01812 Encounter for preprocedural laboratory examination: Secondary | ICD-10-CM | POA: Insufficient documentation

## 2013-07-03 DIAGNOSIS — N390 Urinary tract infection, site not specified: Secondary | ICD-10-CM

## 2013-07-03 DIAGNOSIS — B958 Unspecified staphylococcus as the cause of diseases classified elsewhere: Secondary | ICD-10-CM

## 2013-07-03 DIAGNOSIS — R918 Other nonspecific abnormal finding of lung field: Secondary | ICD-10-CM

## 2013-07-03 DIAGNOSIS — Z01818 Encounter for other preprocedural examination: Secondary | ICD-10-CM | POA: Insufficient documentation

## 2013-07-03 DIAGNOSIS — Z01810 Encounter for preprocedural cardiovascular examination: Secondary | ICD-10-CM | POA: Insufficient documentation

## 2013-07-03 DIAGNOSIS — E876 Hypokalemia: Secondary | ICD-10-CM

## 2013-07-03 HISTORY — DX: Personal history of colon polyps, unspecified: Z86.0100

## 2013-07-03 HISTORY — DX: Gastro-esophageal reflux disease without esophagitis: K21.9

## 2013-07-03 HISTORY — DX: Family history of disorders of kidney and ureter: Z84.1

## 2013-07-03 HISTORY — DX: Dizziness and giddiness: R42

## 2013-07-03 HISTORY — DX: Diverticulosis of intestine, part unspecified, without perforation or abscess without bleeding: K57.90

## 2013-07-03 HISTORY — DX: Nocturia: R35.1

## 2013-07-03 HISTORY — DX: Unspecified osteoarthritis, unspecified site: M19.90

## 2013-07-03 HISTORY — DX: Personal history of colonic polyps: Z86.010

## 2013-07-03 LAB — URINE MICROSCOPIC-ADD ON

## 2013-07-03 LAB — PROTIME-INR
INR: 0.89 (ref 0.00–1.49)
Prothrombin Time: 11.9 seconds (ref 11.6–15.2)

## 2013-07-03 LAB — ABO/RH: ABO/RH(D): O POS

## 2013-07-03 LAB — BLOOD GAS, ARTERIAL
Acid-Base Excess: 4.6 mmol/L — ABNORMAL HIGH (ref 0.0–2.0)
Bicarbonate: 27.6 mEq/L — ABNORMAL HIGH (ref 20.0–24.0)
Drawn by: 18160
FIO2: 0.21 %
O2 Saturation: 98.9 %
Patient temperature: 98.6
TCO2: 28.7 mmol/L (ref 0–100)
pCO2 arterial: 34.3 mmHg — ABNORMAL LOW (ref 35.0–45.0)
pH, Arterial: 7.517 — ABNORMAL HIGH (ref 7.350–7.450)
pO2, Arterial: 118 mmHg — ABNORMAL HIGH (ref 80.0–100.0)

## 2013-07-03 LAB — TYPE AND SCREEN
ABO/RH(D): O POS
Antibody Screen: NEGATIVE

## 2013-07-03 LAB — COMPREHENSIVE METABOLIC PANEL
ALT: 46 U/L — ABNORMAL HIGH (ref 0–35)
AST: 32 U/L (ref 0–37)
Albumin: 4 g/dL (ref 3.5–5.2)
Alkaline Phosphatase: 163 U/L — ABNORMAL HIGH (ref 39–117)
BUN: 15 mg/dL (ref 6–23)
CO2: 26 mEq/L (ref 19–32)
Calcium: 9.8 mg/dL (ref 8.4–10.5)
Chloride: 94 mEq/L — ABNORMAL LOW (ref 96–112)
Creatinine, Ser: 0.76 mg/dL (ref 0.50–1.10)
GFR calc Af Amer: >90 mL/min (ref >90)
GFR calc non Af Amer: 85 mL/min — ABNORMAL LOW (ref >90)
Glucose, Bld: 104 mg/dL — ABNORMAL HIGH (ref 70–99)
Potassium: 2.9 mEq/L — ABNORMAL LOW (ref 3.5–5.1)
Sodium: 135 mEq/L (ref 135–145)
Total Bilirubin: 0.3 mg/dL (ref 0.3–1.2)
Total Protein: 7.3 g/dL (ref 6.0–8.3)

## 2013-07-03 LAB — CBC
HCT: 38.8 % (ref 36.0–46.0)
Hemoglobin: 14 g/dL (ref 12.0–15.0)
MCH: 34.1 pg — ABNORMAL HIGH (ref 26.0–34.0)
MCHC: 36.1 g/dL — ABNORMAL HIGH (ref 30.0–36.0)
MCV: 94.4 fL (ref 78.0–100.0)
Platelets: 336 10*3/uL (ref 150–400)
RBC: 4.11 MIL/uL (ref 3.87–5.11)
RDW: 12 % (ref 11.5–15.5)
WBC: 6.1 10*3/uL (ref 4.0–10.5)

## 2013-07-03 LAB — SURGICAL PCR SCREEN
MRSA, PCR: NEGATIVE
Staphylococcus aureus: POSITIVE — AB

## 2013-07-03 LAB — URINALYSIS, ROUTINE W REFLEX MICROSCOPIC
Bilirubin Urine: NEGATIVE
Glucose, UA: NEGATIVE mg/dL
Ketones, ur: NEGATIVE mg/dL
Nitrite: NEGATIVE
Protein, ur: NEGATIVE mg/dL
Specific Gravity, Urine: 1.01 (ref 1.005–1.030)
Urobilinogen, UA: 0.2 mg/dL (ref 0.0–1.0)
pH: 8.5 — ABNORMAL HIGH (ref 5.0–8.0)

## 2013-07-03 LAB — APTT: aPTT: 27 seconds (ref 24–37)

## 2013-07-03 MED ORDER — MUPIROCIN 2 % EX OINT: 1.0000 "application " | TOPICAL_OINTMENT | Freq: Two times a day (BID) | CUTANEOUS | Status: DC

## 2013-07-03 MED ORDER — CIPROFLOXACIN HCL 250 MG PO TABS: 250.0000 mg | ORAL_TABLET | Freq: Two times a day (BID) | ORAL | Status: DC

## 2013-07-03 MED ORDER — POTASSIUM CHLORIDE CRYS ER 20 MEQ PO TBCR: 20.0000 meq | EXTENDED_RELEASE_TABLET | Freq: Two times a day (BID) | ORAL | Status: DC

## 2013-07-03 NOTE — Pre-Procedure Instructions (Signed)
[name] [facility]  07/03/2013   Your procedure is scheduled on:  Mon, Dec 1 @ 7:30 AM  Report to [facility] Stay [facility]trance A at 5:30 AM.  Call this number if you have problems the morning of surgery: [phone]   Remember:   Do not eat food or drink liquids after midnight.   Take these medicines the morning of surgery with A SIP OF WATER: Amlodipine(Norvasc) and Nexium(Esomeprazole)              No Goody's,BC's,Aspirin,Aleve,Ibuprofen,Fish Oil,or any Herbal Medications   Do not wear jewelry, make-up or nail polish.  Do not wear lotions, powders, or perfumes. You may wear deodorant.  Do not shave 48 hours prior to surgery.    Do not bring valuables to the hospital.  [facility] is not responsible                  for any belongings or valuables.               Contacts, dentures or bridgework may not be worn into surgery.  Leave suitcase in the car. After surgery it may be brought to your room.  For patients admitted to the hospital, discharge time is determined by your                treatment team.                 Special Instructions: Shower using CHG 2 nights before surgery and the night before surgery.  If you shower the day of surgery use CHG.  Use special wash - you have one bottle of CHG for all showers.  You should use approximately 1/3 of the bottle for each shower.   Please read over the following fact sheets that you were given: Pain Booklet, Coughing and Deep Breathing, Blood Transfusion Information, MRSA Information and Surgical Site Infection Prevention

## 2013-07-03 NOTE — Progress Notes (Addendum)
Anesthesia chart review: Patient is a 67 year old female scheduled for video bronchoscopy, right video-assisted thoracoscopy, wedge resection on 07/08/2013 by Dr. Tyrone Sage.  She was recently diagnosed with a RUL lung mass with cavity lesion. Findings felt suspicious for adenocarcinoma.  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 PCP is Dr. Burton Apley.  She is not currently followed by cardiology, but saw Dr. Elease Hashimoto in 2010 and had a negative stress test with EF 79% on 06/29/09.  EKG on 07/03/13 showed NSR.  PFT's 07/01/13 showed: FEV1 2.40 91 % DLCO 20.87 73%.  Chest CT on 06/19/13 showed: 1. Findings highly suspicious for primary bronchogenic carcinoma of the right upper lobe. Soft tissue component of the mass measures 2.6 x 2.5 x 3.5 cm. PET-CT should be considered for further evaluation.  2. 2 faint pulmonary nodules in the right upper lobe measures 3 and 4 mm respectively, for which metastatic pulmonary of cannot be excluded.  3. Moderate centrilobular emphysema.  4. Central superior endplate mild compression deformity of the T9 vertebral body.  She is for a CXR on the day of surgery.  Preoperative labs noted.  ABG shows a pH of 7.5, pCO2 34.3, pO2 118.  K 2.9, Cr 0.76, H/H 14.0/38.8. UA shows large leukocytes, but negative nitrites. Urine culture is still pending. I have called results to Darius Bump, RN at [facility].  She will notify Dr. Donata Clay who is on call this holiday weekend to follow-up results and make recommendations as felt appropriate.  I will order an ISTAT on arrival to re-evaluate for hypokalemia.    Velna Ochs [facility]/Anesthesiology Phone [phone] 07/03/2013 3:05 PM

## 2013-07-03 NOTE — Telephone Encounter (Signed)
RX's for Potassium 20 meq 1 tab BID x 10 day and Cipro 250 mg bid x 10 days called to pt's pharm and pt notified.

## 2013-07-03 NOTE — Progress Notes (Signed)
Saw Dr.Nishan in 2010 d/t medical md wanting this to be done  Stress test report in epic from 06-29-09  Denies ever having an echo or heart cath  Medical Md is Dr.Ronald Su Hilt  Denies EKG in past month

## 2013-07-05 ENCOUNTER — Inpatient Hospital Stay (HOSPITAL_COMMUNITY): Admission: RE | Admit: 2013-07-05 | Payer: Medicare Other | Source: Ambulatory Visit

## 2013-07-05 LAB — URINE CULTURE: Colony Count: >=100000

## 2013-07-07 ENCOUNTER — Encounter (HOSPITAL_COMMUNITY): Payer: Self-pay | Admitting: Certified Registered Nurse Anesthetist

## 2013-07-07 MED ORDER — DEXTROSE 5 % IV SOLN
1.5000 g | INTRAVENOUS | Status: AC
  Administered 2013-07-08: 1.5 g via INTRAVENOUS
  Filled 2013-07-07: qty 1.5

## 2013-07-08 ENCOUNTER — Inpatient Hospital Stay (HOSPITAL_COMMUNITY): Payer: Medicare Other | Admitting: Anesthesiology

## 2013-07-08 ENCOUNTER — Encounter (HOSPITAL_COMMUNITY)
Admission: RE | Disposition: A | Discharge status: Home / Self Care | Payer: Self-pay | Source: Ambulatory Visit | Attending: Cardiothoracic Surgery

## 2013-07-08 ENCOUNTER — Inpatient Hospital Stay (HOSPITAL_COMMUNITY): Payer: Medicare Other

## 2013-07-08 ENCOUNTER — Encounter (HOSPITAL_COMMUNITY): Payer: Medicare Other | Admitting: Vascular Surgery

## 2013-07-08 ENCOUNTER — Inpatient Hospital Stay (HOSPITAL_COMMUNITY)
Admission: RE | Admit: 2013-07-08 | Discharge: 2013-07-15 | DRG: 163 | Disposition: A | Discharge status: Home / Self Care | Payer: Medicare Other | Source: Ambulatory Visit | Attending: Cardiothoracic Surgery | Admitting: Cardiothoracic Surgery

## 2013-07-08 ENCOUNTER — Encounter (HOSPITAL_COMMUNITY): Payer: Self-pay | Admitting: *Deleted

## 2013-07-08 DIAGNOSIS — E876 Hypokalemia: Secondary | ICD-10-CM | POA: Diagnosis not present

## 2013-07-08 DIAGNOSIS — I10 Essential (primary) hypertension: Secondary | ICD-10-CM | POA: Diagnosis present

## 2013-07-08 DIAGNOSIS — M129 Arthropathy, unspecified: Secondary | ICD-10-CM | POA: Diagnosis present

## 2013-07-08 DIAGNOSIS — J438 Other emphysema: Secondary | ICD-10-CM | POA: Diagnosis present

## 2013-07-08 DIAGNOSIS — Z930 Tracheostomy status: Secondary | ICD-10-CM | POA: Diagnosis present

## 2013-07-08 DIAGNOSIS — R918 Other nonspecific abnormal finding of lung field: Secondary | ICD-10-CM

## 2013-07-08 DIAGNOSIS — J9382 Other air leak: Secondary | ICD-10-CM | POA: Diagnosis not present

## 2013-07-08 DIAGNOSIS — K219 Gastro-esophageal reflux disease without esophagitis: Secondary | ICD-10-CM | POA: Diagnosis present

## 2013-07-08 DIAGNOSIS — C3410 Malignant neoplasm of upper lobe, unspecified bronchus or lung: Secondary | ICD-10-CM | POA: Diagnosis present

## 2013-07-08 DIAGNOSIS — Z860100 Personal history of colon polyps, unspecified: Secondary | ICD-10-CM

## 2013-07-08 DIAGNOSIS — E43 Unspecified severe protein-calorie malnutrition: Secondary | ICD-10-CM | POA: Insufficient documentation

## 2013-07-08 DIAGNOSIS — E785 Hyperlipidemia, unspecified: Secondary | ICD-10-CM | POA: Diagnosis present

## 2013-07-08 DIAGNOSIS — J9383 Other pneumothorax: Secondary | ICD-10-CM | POA: Diagnosis not present

## 2013-07-08 DIAGNOSIS — K5900 Constipation, unspecified: Secondary | ICD-10-CM | POA: Diagnosis not present

## 2013-07-08 DIAGNOSIS — K5730 Diverticulosis of large intestine without perforation or abscess without bleeding: Secondary | ICD-10-CM | POA: Diagnosis present

## 2013-07-08 DIAGNOSIS — D649 Anemia, unspecified: Secondary | ICD-10-CM | POA: Diagnosis not present

## 2013-07-08 DIAGNOSIS — Z85118 Personal history of other malignant neoplasm of bronchus and lung: Secondary | ICD-10-CM | POA: Diagnosis present

## 2013-07-08 DIAGNOSIS — Z79899 Other long term (current) drug therapy: Secondary | ICD-10-CM

## 2013-07-08 DIAGNOSIS — Z8249 Family history of ischemic heart disease and other diseases of the circulatory system: Secondary | ICD-10-CM

## 2013-07-08 DIAGNOSIS — Z87891 Personal history of nicotine dependence: Secondary | ICD-10-CM

## 2013-07-08 DIAGNOSIS — N390 Urinary tract infection, site not specified: Secondary | ICD-10-CM | POA: Diagnosis present

## 2013-07-08 DIAGNOSIS — J90 Pleural effusion, not elsewhere classified: Secondary | ICD-10-CM | POA: Diagnosis not present

## 2013-07-08 DIAGNOSIS — Z902 Acquired absence of lung [part of]: Secondary | ICD-10-CM

## 2013-07-08 HISTORY — DX: Malignant neoplasm of upper lobe, unspecified bronchus or lung: C34.10

## 2013-07-08 HISTORY — PX: VIDEO ASSISTED THORACOSCOPY (VATS)/WEDGE RESECTION: SHX6174

## 2013-07-08 HISTORY — PX: VIDEO BRONCHOSCOPY: SHX5072

## 2013-07-08 LAB — POCT I-STAT 4, (NA,K, GLUC, HGB,HCT)
Glucose, Bld: 110 mg/dL — ABNORMAL HIGH (ref 70–99)
HCT: 40 % (ref 36.0–46.0)
Hemoglobin: 13.6 g/dL (ref 12.0–15.0)
Potassium: 3.5 mEq/L (ref 3.5–5.1)
Sodium: 137 mEq/L (ref 135–145)

## 2013-07-08 SURGERY — BRONCHOSCOPY, VIDEO-ASSISTED
Anesthesia: General | Site: Chest | Laterality: Right | Wound class: Clean Contaminated

## 2013-07-08 MED ORDER — GLYCOPYRROLATE 0.2 MG/ML IJ SOLN
INTRAMUSCULAR | Status: DC | PRN
Start: 2013-07-08 — End: 2013-07-08
  Administered 2013-07-08: 0.6 mg via INTRAVENOUS
  Administered 2013-07-08: 0.2 mg via INTRAVENOUS

## 2013-07-08 MED ORDER — ACETAMINOPHEN 500 MG PO TABS
1000.0000 mg | ORAL_TABLET | Freq: Four times a day (QID) | ORAL | Status: AC
  Administered 2013-07-08 – 2013-07-09 (×4): 1000 mg via ORAL
  Filled 2013-07-08 (×4): qty 2

## 2013-07-08 MED ORDER — BISACODYL 5 MG PO TBEC
10.0000 mg | DELAYED_RELEASE_TABLET | Freq: Every day | ORAL | Status: DC
  Administered 2013-07-08 – 2013-07-15 (×8): 10 mg via ORAL
  Filled 2013-07-08 (×8): qty 2

## 2013-07-08 MED ORDER — ARTIFICIAL TEARS OP OINT
TOPICAL_OINTMENT | OPHTHALMIC | Status: DC | PRN
  Administered 2013-07-08: 1 via OPHTHALMIC

## 2013-07-08 MED ORDER — ROCURONIUM BROMIDE 100 MG/10ML IV SOLN
INTRAVENOUS | Status: DC | PRN
  Administered 2013-07-08 (×2): 10 mg via INTRAVENOUS
  Administered 2013-07-08: 50 mg via INTRAVENOUS
  Administered 2013-07-08 (×3): 5 mg via INTRAVENOUS

## 2013-07-08 MED ORDER — BUPIVACAINE 0.5 % ON-Q PUMP SINGLE CATH 400 ML
INJECTION | Status: DC | PRN
  Administered 2013-07-08: 400 mL

## 2013-07-08 MED ORDER — HYDROMORPHONE HCL PF 1 MG/ML IJ SOLN
INTRAMUSCULAR | Status: AC
  Filled 2013-07-08: qty 1

## 2013-07-08 MED ORDER — LIDOCAINE HCL (CARDIAC) 20 MG/ML IV SOLN
INTRAVENOUS | Status: DC | PRN
  Administered 2013-07-08: 10 mg via INTRAVENOUS

## 2013-07-08 MED ORDER — DIPHENHYDRAMINE HCL 50 MG/ML IJ SOLN
12.5000 mg | Freq: Four times a day (QID) | INTRAMUSCULAR | Status: DC | PRN
  Filled 2013-07-08: qty 0.25

## 2013-07-08 MED ORDER — HEMOSTATIC AGENTS (NO CHARGE) OPTIME
TOPICAL | Status: DC | PRN
  Administered 2013-07-08: 1 via TOPICAL

## 2013-07-08 MED ORDER — ALBUTEROL SULFATE HFA 108 (90 BASE) MCG/ACT IN AERS
2.0000 | INHALATION_SPRAY | Freq: Four times a day (QID) | RESPIRATORY_TRACT | Status: AC | PRN
  Filled 2013-07-08: qty 6.7

## 2013-07-08 MED ORDER — MUPIROCIN 2 % EX OINT: 1.0000 "application " | TOPICAL_OINTMENT | Freq: Two times a day (BID) | CUTANEOUS | Status: DC

## 2013-07-08 MED ORDER — AMLODIPINE BESYLATE 5 MG PO TABS
5.0000 mg | ORAL_TABLET | Freq: Every day | ORAL | Status: DC
  Administered 2013-07-09 – 2013-07-15 (×7): 5 mg via ORAL
  Filled 2013-07-08 (×7): qty 1

## 2013-07-08 MED ORDER — ONDANSETRON HCL 4 MG/2ML IJ SOLN: 4.0000 mg | Freq: Four times a day (QID) | INTRAMUSCULAR | Status: DC | PRN

## 2013-07-08 MED ORDER — TRAMADOL HCL 50 MG PO TABS
50.0000 mg | ORAL_TABLET | Freq: Four times a day (QID) | ORAL | Status: DC | PRN
  Administered 2013-07-09: 100 mg via ORAL
  Administered 2013-07-10: 50 mg via ORAL
  Administered 2013-07-11: 100 mg via ORAL
  Administered 2013-07-12: 50 mg via ORAL
  Filled 2013-07-08: qty 1
  Filled 2013-07-08: qty 2
  Filled 2013-07-08: qty 1
  Filled 2013-07-08: qty 2

## 2013-07-08 MED ORDER — PROMETHAZINE HCL 25 MG/ML IJ SOLN: 6.2500 mg | INTRAMUSCULAR | Status: DC | PRN

## 2013-07-08 MED ORDER — HYDROMORPHONE HCL PF 1 MG/ML IJ SOLN
0.2500 mg | INTRAMUSCULAR | Status: DC | PRN
  Administered 2013-07-08 (×2): 0.5 mg via INTRAVENOUS

## 2013-07-08 MED ORDER — FENTANYL CITRATE 0.05 MG/ML IJ SOLN
INTRAMUSCULAR | Status: DC | PRN
  Administered 2013-07-08 (×3): 50 ug via INTRAVENOUS
  Administered 2013-07-08 (×2): 100 ug via INTRAVENOUS
  Administered 2013-07-08: 150 ug via INTRAVENOUS

## 2013-07-08 MED ORDER — DEXAMETHASONE SODIUM PHOSPHATE 4 MG/ML IJ SOLN
INTRAMUSCULAR | Status: DC | PRN
  Administered 2013-07-08: 8 mg via INTRAVENOUS

## 2013-07-08 MED ORDER — EPHEDRINE SULFATE 50 MG/ML IJ SOLN
INTRAMUSCULAR | Status: DC | PRN
  Administered 2013-07-08: 5 mg via INTRAVENOUS

## 2013-07-08 MED ORDER — BUPIVACAINE 0.5 % ON-Q PUMP SINGLE CATH 400 ML
400.0000 mL | INJECTION | Status: DC
  Filled 2013-07-08: qty 400

## 2013-07-08 MED ORDER — ACETAMINOPHEN 160 MG/5ML PO SOLN: 1000.0000 mg | Freq: Four times a day (QID) | ORAL | Status: AC

## 2013-07-08 MED ORDER — OXYCODONE HCL 5 MG PO TABS
5.0000 mg | ORAL_TABLET | ORAL | Status: AC | PRN
  Administered 2013-07-08: 10 mg via ORAL
  Filled 2013-07-08: qty 2

## 2013-07-08 MED ORDER — LACTATED RINGERS IV SOLN
INTRAVENOUS | Status: DC | PRN
  Administered 2013-07-08 (×3): via INTRAVENOUS

## 2013-07-08 MED ORDER — NALOXONE HCL 0.4 MG/ML IJ SOLN
0.4000 mg | INTRAMUSCULAR | Status: DC | PRN
  Filled 2013-07-08: qty 1

## 2013-07-08 MED ORDER — DIPHENHYDRAMINE HCL 12.5 MG/5ML PO ELIX
12.5000 mg | ORAL_SOLUTION | Freq: Four times a day (QID) | ORAL | Status: DC | PRN
  Filled 2013-07-08: qty 5

## 2013-07-08 MED ORDER — ONDANSETRON HCL 4 MG/2ML IJ SOLN
INTRAMUSCULAR | Status: DC | PRN
  Administered 2013-07-08: 4 mg via INTRAVENOUS

## 2013-07-08 MED ORDER — PHENYLEPHRINE HCL 10 MG/ML IJ SOLN
INTRAMUSCULAR | Status: DC | PRN
  Administered 2013-07-08 (×5): 80 ug via INTRAVENOUS

## 2013-07-08 MED ORDER — PROPOFOL 10 MG/ML IV BOLUS
INTRAVENOUS | Status: DC | PRN
  Administered 2013-07-08: 120 mg via INTRAVENOUS

## 2013-07-08 MED ORDER — CHLORTHALIDONE 25 MG PO TABS
25.0000 mg | ORAL_TABLET | Freq: Every day | ORAL | Status: DC
  Administered 2013-07-09 – 2013-07-15 (×7): 25 mg via ORAL
  Filled 2013-07-08 (×7): qty 1

## 2013-07-08 MED ORDER — ONDANSETRON HCL 4 MG/2ML IJ SOLN
4.0000 mg | Freq: Four times a day (QID) | INTRAMUSCULAR | Status: DC | PRN
  Administered 2013-07-08: 4 mg via INTRAVENOUS
  Filled 2013-07-08: qty 2

## 2013-07-08 MED ORDER — OXYCODONE HCL 5 MG PO TABS: 5.0000 mg | ORAL_TABLET | Freq: Once | ORAL | Status: DC | PRN

## 2013-07-08 MED ORDER — OXYCODONE HCL 5 MG/5ML PO SOLN: 5.0000 mg | Freq: Once | ORAL | Status: DC | PRN

## 2013-07-08 MED ORDER — MIDAZOLAM HCL 5 MG/5ML IJ SOLN
INTRAMUSCULAR | Status: DC | PRN
  Administered 2013-07-08: 2 mg via INTRAVENOUS

## 2013-07-08 MED ORDER — FENTANYL 10 MCG/ML IV SOLN
INTRAVENOUS | Status: DC
  Administered 2013-07-08: 75 ug via INTRAVENOUS
  Administered 2013-07-08: 15 ug via INTRAVENOUS
  Administered 2013-07-08: 165 ug via INTRAVENOUS
  Administered 2013-07-09: 88.39 ug via INTRAVENOUS
  Administered 2013-07-09: 120 ug via INTRAVENOUS
  Administered 2013-07-09: 75 ug via INTRAVENOUS
  Administered 2013-07-09: 90 ug via INTRAVENOUS
  Administered 2013-07-09: 75 ug via INTRAVENOUS
  Administered 2013-07-09: 45 ug via INTRAVENOUS
  Administered 2013-07-09: 02:00:00 via INTRAVENOUS
  Administered 2013-07-10: 60 ug via INTRAVENOUS
  Administered 2013-07-10 (×2): 45 ug via INTRAVENOUS
  Administered 2013-07-10: 120 ug via INTRAVENOUS
  Administered 2013-07-10: 90 ug via INTRAVENOUS
  Administered 2013-07-10 – 2013-07-11 (×3): 45 ug via INTRAVENOUS
  Administered 2013-07-11: 15 ug via INTRAVENOUS
  Filled 2013-07-08 (×3): qty 50

## 2013-07-08 MED ORDER — MEPERIDINE HCL 25 MG/ML IJ SOLN: 6.2500 mg | INTRAMUSCULAR | Status: DC | PRN

## 2013-07-08 MED ORDER — POTASSIUM CHLORIDE 10 MEQ/50ML IV SOLN
10.0000 meq | Freq: Every day | INTRAVENOUS | Status: DC | PRN
  Administered 2013-07-11 (×3): 10 meq via INTRAVENOUS
  Filled 2013-07-08 (×3): qty 50

## 2013-07-08 MED ORDER — SENNOSIDES-DOCUSATE SODIUM 8.6-50 MG PO TABS
1.0000 | ORAL_TABLET | Freq: Every evening | ORAL | Status: DC | PRN
  Filled 2013-07-08: qty 1

## 2013-07-08 MED ORDER — DEXTROSE-NACL 5-0.9 % IV SOLN
INTRAVENOUS | Status: DC
  Administered 2013-07-08: 125 mL/h via INTRAVENOUS
  Administered 2013-07-09 (×2): via INTRAVENOUS
  Administered 2013-07-11 (×2): 20 mL/h via INTRAVENOUS

## 2013-07-08 MED ORDER — OXYCODONE-ACETAMINOPHEN 5-325 MG PO TABS
1.0000 | ORAL_TABLET | ORAL | Status: DC | PRN
  Administered 2013-07-09: 2 via ORAL
  Administered 2013-07-10 – 2013-07-11 (×4): 1 via ORAL
  Administered 2013-07-11 – 2013-07-15 (×8): 2 via ORAL
  Filled 2013-07-08: qty 1
  Filled 2013-07-08 (×3): qty 2
  Filled 2013-07-08: qty 1
  Filled 2013-07-08 (×5): qty 2
  Filled 2013-07-08 (×2): qty 1
  Filled 2013-07-08: qty 2

## 2013-07-08 MED ORDER — PANTOPRAZOLE SODIUM 40 MG PO TBEC
40.0000 mg | DELAYED_RELEASE_TABLET | Freq: Every day | ORAL | Status: DC
  Administered 2013-07-09 – 2013-07-15 (×7): 40 mg via ORAL
  Filled 2013-07-08 (×7): qty 1

## 2013-07-08 MED ORDER — SODIUM CHLORIDE 0.9 % IJ SOLN: 9.0000 mL | INTRAMUSCULAR | Status: DC | PRN

## 2013-07-08 MED ORDER — NEOSTIGMINE METHYLSULFATE 1 MG/ML IJ SOLN
INTRAMUSCULAR | Status: DC | PRN
  Administered 2013-07-08: 4 mg via INTRAVENOUS

## 2013-07-08 MED ORDER — MIDAZOLAM HCL 2 MG/2ML IJ SOLN: 0.5000 mg | Freq: Once | INTRAMUSCULAR | Status: DC | PRN

## 2013-07-08 MED ORDER — 0.9 % SODIUM CHLORIDE (POUR BTL) OPTIME
TOPICAL | Status: DC | PRN
  Administered 2013-07-08: 1000 mL

## 2013-07-08 MED ORDER — DEXTROSE 5 % IV SOLN
1.5000 g | Freq: Two times a day (BID) | INTRAVENOUS | Status: AC
  Administered 2013-07-08 – 2013-07-09 (×2): 1.5 g via INTRAVENOUS
  Filled 2013-07-08 (×2): qty 1.5

## 2013-07-08 MED ORDER — SIMVASTATIN 20 MG PO TABS
20.0000 mg | ORAL_TABLET | Freq: Every evening | ORAL | Status: DC
  Administered 2013-07-10 – 2013-07-14 (×5): 20 mg via ORAL
  Filled 2013-07-08 (×7): qty 1

## 2013-07-08 SURGICAL SUPPLY — 101 items
ADH SKN CLS APL DERMABOND .7 (GAUZE/BANDAGES/DRESSINGS) ×2
APL SRG 22X2 LUM MLBL SLNT (VASCULAR PRODUCTS)
APL SRG 7X2 LUM MLBL SLNT (VASCULAR PRODUCTS)
APPLICATOR TIP COSEAL (VASCULAR PRODUCTS) IMPLANT
APPLICATOR TIP EXT COSEAL (VASCULAR PRODUCTS) IMPLANT
BAG SPEC RTRVL 10 TROC 200 (ENDOMECHANICALS) ×2
BLADE SURG 11 STRL SS (BLADE) ×1 IMPLANT
BRUSH CYTOL CELLEBRITY 1.5X140 (MISCELLANEOUS) IMPLANT
CANISTER SUCTION 2500CC (MISCELLANEOUS) ×3 IMPLANT
CATH KIT ON Q 5IN SLV (PAIN MANAGEMENT) ×1 IMPLANT
CATH THORACIC 28FR (CATHETERS) ×1 IMPLANT
CATH THORACIC 36FR (CATHETERS) IMPLANT
CATH THORACIC 36FR RT ANG (CATHETERS) IMPLANT
CLIP TI MEDIUM 24 (CLIP) ×1 IMPLANT
CLIP TI MEDIUM 6 (CLIP) IMPLANT
CONN ST 1/4X3/8  BEN (MISCELLANEOUS) ×1
CONN ST 1/4X3/8 BEN (MISCELLANEOUS) IMPLANT
CONN Y 3/8X3/8X3/8  BEN (MISCELLANEOUS)
CONN Y 3/8X3/8X3/8 BEN (MISCELLANEOUS) IMPLANT
CONT SPEC 4OZ CLIKSEAL STRL BL (MISCELLANEOUS) ×8 IMPLANT
COVER TABLE BACK 60X90 (DRAPES) ×3 IMPLANT
DERMABOND ADVANCED (GAUZE/BANDAGES/DRESSINGS) ×1
DERMABOND ADVANCED .7 DNX12 (GAUZE/BANDAGES/DRESSINGS) IMPLANT
DRAIN CHANNEL 28F RND 3/8 FF (WOUND CARE) ×1 IMPLANT
DRAPE LAPAROSCOPIC ABDOMINAL (DRAPES) ×3 IMPLANT
DRAPE WARM FLUID 44X44 (DRAPE) ×3 IMPLANT
DRILL BIT 7/64X5 (BIT) ×1 IMPLANT
DRSG AQUACEL AG ADV 3.5X14 (GAUZE/BANDAGES/DRESSINGS) ×3 IMPLANT
ELECT BLADE 4.0 EZ CLEAN MEGAD (MISCELLANEOUS) ×3
ELECT REM PT RETURN 9FT ADLT (ELECTROSURGICAL) ×3
ELECTRODE BLDE 4.0 EZ CLN MEGD (MISCELLANEOUS) ×2 IMPLANT
ELECTRODE REM PT RTRN 9FT ADLT (ELECTROSURGICAL) ×2 IMPLANT
FORCEPS BIOP RJ4 1.8 (CUTTING FORCEPS) IMPLANT
GLOVE BIO SURGEON STRL SZ 6.5 (GLOVE) ×7 IMPLANT
GLOVE BIOGEL M STER SZ 6 (GLOVE) ×3 IMPLANT
GOWN STRL NON-REIN LRG LVL3 (GOWN DISPOSABLE) ×13 IMPLANT
HANDLE STAPLE ENDO GIA SHORT (STAPLE) ×1
KIT BASIN OR (CUSTOM PROCEDURE TRAY) ×3 IMPLANT
KIT ROOM TURNOVER OR (KITS) ×3 IMPLANT
KIT SUCTION CATH 14FR (SUCTIONS) ×4 IMPLANT
MARKER SKIN DUAL TIP RULER LAB (MISCELLANEOUS) ×3 IMPLANT
NDL BIOPSY TRANSBRONCH 21G (NEEDLE) IMPLANT
NEEDLE BIOPSY TRANSBRONCH 21G (NEEDLE) IMPLANT
NS IRRIG 1000ML POUR BTL (IV SOLUTION) ×6 IMPLANT
OIL SILICONE PENTAX (PARTS (SERVICE/REPAIRS)) ×3 IMPLANT
PACK CHEST (CUSTOM PROCEDURE TRAY) ×3 IMPLANT
PAD ARMBOARD 7.5X6 YLW CONV (MISCELLANEOUS) ×6 IMPLANT
PAIN PUMP ON-Q 400MLX5ML 5IN (MISCELLANEOUS) ×1 IMPLANT
PASSER SUT SWANSON 36MM LOOP (INSTRUMENTS) ×1 IMPLANT
POUCH RETRIEVAL ECOSAC 10 (ENDOMECHANICALS) IMPLANT
POUCH RETRIEVAL ECOSAC 10MM (ENDOMECHANICALS) ×1
RELOAD EGIA 45 MED/THCK PURPLE (STAPLE) ×2 IMPLANT
RELOAD EGIA 60 MED/THCK PURPLE (STAPLE) ×24 IMPLANT
RELOAD EGIA TRIS TAN 45 CVD (STAPLE) ×12 IMPLANT
RELOAD STAPLE 45 TAN MED CVD (STAPLE) IMPLANT
RELOAD STAPLE 60 BLK XTHK ART (STAPLE) IMPLANT
RELOAD STAPLE 60 MED/THCK ART (STAPLE) IMPLANT
RELOAD TRI 2.0 60 XTHK VAS SUL (STAPLE) ×3 IMPLANT
SCISSORS LAP 5X35 DISP (ENDOMECHANICALS) IMPLANT
SEALANT PROGEL (MISCELLANEOUS) IMPLANT
SEALANT SURG COSEAL 4ML (VASCULAR PRODUCTS) IMPLANT
SEALANT SURG COSEAL 8ML (VASCULAR PRODUCTS) ×1 IMPLANT
SOLUTION ANTI FOG 6CC (MISCELLANEOUS) ×3 IMPLANT
SPONGE GAUZE 4X4 12PLY (GAUZE/BANDAGES/DRESSINGS) ×3 IMPLANT
SPONGE INTESTINAL PEANUT (DISPOSABLE) ×1 IMPLANT
STAPLER ENDO GIA 12 SHRT THIN (STAPLE) IMPLANT
STAPLER ENDO GIA 12MM SHORT (STAPLE) ×2 IMPLANT
SUT BONE WAX W31G (SUTURE) ×1 IMPLANT
SUT PROLENE 3 0 SH DA (SUTURE) IMPLANT
SUT PROLENE 4 0 RB 1 (SUTURE)
SUT PROLENE 4-0 RB1 .5 CRCL 36 (SUTURE) IMPLANT
SUT SILK  1 MH (SUTURE) ×4
SUT SILK 1 MH (SUTURE) ×4 IMPLANT
SUT SILK 2 0 SH (SUTURE) IMPLANT
SUT SILK 2 0 SH CR/8 (SUTURE) ×1 IMPLANT
SUT SILK 2 0SH CR/8 30 (SUTURE) IMPLANT
SUT SILK 3 0SH CR/8 30 (SUTURE) IMPLANT
SUT STEEL 1 (SUTURE) IMPLANT
SUT VIC AB 1 CTX 18 (SUTURE) ×1 IMPLANT
SUT VIC AB 1 CTX 36 (SUTURE)
SUT VIC AB 1 CTX36XBRD ANBCTR (SUTURE) IMPLANT
SUT VIC AB 2-0 CTX 36 (SUTURE) ×2 IMPLANT
SUT VIC AB 2-0 UR6 27 (SUTURE) IMPLANT
SUT VIC AB 3-0 SH 8-18 (SUTURE) IMPLANT
SUT VIC AB 3-0 X1 27 (SUTURE) ×1 IMPLANT
SUT VICRYL 2 TP 1 (SUTURE) ×1 IMPLANT
SWAB COLLECTION DEVICE MRSA (MISCELLANEOUS) IMPLANT
SYR 20ML ECCENTRIC (SYRINGE) ×3 IMPLANT
SYSTEM SAHARA CHEST DRAIN ATS (WOUND CARE) ×3 IMPLANT
TAPE CLOTH SURG 4X10 WHT LF (GAUZE/BANDAGES/DRESSINGS) ×1 IMPLANT
TAPE UMBILICAL COTTON 1/8X30 (MISCELLANEOUS) ×1 IMPLANT
TIP APPLICATOR SPRAY EXTEND 16 (VASCULAR PRODUCTS) IMPLANT
TOWEL OR 17X24 6PK STRL BLUE (TOWEL DISPOSABLE) ×6 IMPLANT
TOWEL OR 17X26 10 PK STRL BLUE (TOWEL DISPOSABLE) ×6 IMPLANT
TRAP SPECIMEN MUCOUS 40CC (MISCELLANEOUS) ×3 IMPLANT
TRAY FOLEY CATH 14FRSI W/METER (CATHETERS) ×3 IMPLANT
TUBE ANAEROBIC SPECIMEN COL (MISCELLANEOUS) IMPLANT
TUBE CONNECTING 12X1/4 (SUCTIONS) ×7 IMPLANT
TUNNELER SHEATH ON-Q 11GX8 DSP (PAIN MANAGEMENT) ×4 IMPLANT
WATER STERILE IRR 1000ML POUR (IV SOLUTION) ×6 IMPLANT
YANKAUER SUCT BULB TIP NO VENT (SUCTIONS) ×1 IMPLANT

## 2013-07-08 NOTE — Anesthesia Postprocedure Evaluation (Signed)
  Anesthesia Post-op Note  Patient: [name]  Procedure(s) Performed: Procedure(s): VIDEO BRONCHOSCOPY (N/A) VIDEO ASSISTED THORACOSCOPY (VATS)/WEDGE RESECTION (Right)  Patient Location: PACU  Anesthesia Type:General  Level of Consciousness: awake, alert , oriented and patient cooperative  Airway and Oxygen Therapy: Patient Spontanous Breathing and Patient connected to nasal cannula oxygen  Post-op Pain: mild  Post-op Assessment: Post-op Vital signs reviewed, Patient's Cardiovascular Status Stable, Respiratory Function Stable, Patent Airway, No signs of Nausea or vomiting and Pain level controlled  Post-op Vital Signs: Reviewed and stable  Complications: No apparent anesthesia complications

## 2013-07-08 NOTE — Brief Op Note (Addendum)
      [address]       [name] [address]             [phone]     07/08/2013  12:30 PM  PATIENT:  [name]  67 y.o. 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  PRE-OPERATIVE WEIGHT: 64kg  Complications: no known

## 2013-07-08 NOTE — Progress Notes (Signed)
PM ROUNDS  S/p Right upper lobectomy  BP 138/68  Pulse 76  Temp(Src) 98.7 F (37.1 C) (Oral)  Resp 8  SpO2 98%   Intake/Output Summary (Last 24 hours) at 07/08/13 1754 Last data filed at 07/08/13 1700  Gross per 24 hour  Intake 2572.92 ml  Output   1020 ml  Net 1552.92 ml   CT output 120 ml  Pain well controlled

## 2013-07-08 NOTE — Anesthesia Procedure Notes (Signed)
Procedure Name: Intubation Date/Time: 07/08/2013 8:15 AM Performed by: Treyden Hakim, Nuala Alpha Pre-anesthesia Checklist: Patient identified, Timeout performed, Suction available, Emergency Drugs available and Patient being monitored Patient Re-evaluated:Patient Re-evaluated prior to inductionOxygen Delivery Method: Circle system utilized Preoxygenation: Pre-oxygenation with 100% oxygen Intubation Type: IV induction Ventilation: Mask ventilation without difficulty Laryngoscope Size: Mac and 4 Grade View: Grade II Tube type: Oral (8.5ett for Bronch only.) Endobronchial tube: Left and 35 Fr Number of attempts: 1 Airway Equipment and Method: Stylet and Fiberoptic brochoscope (Fiberoptic by CJackson MD) Placement Confirmation: ETT inserted through vocal cords under direct vision,  positive ETCO2,  CO2 detector and breath sounds checked- equal and bilateral Secured at: 32 cm Tube secured with: Tape Dental Injury: Teeth and Oropharynx as per pre-operative assessment

## 2013-07-08 NOTE — Anesthesia Preprocedure Evaluation (Addendum)
Anesthesia Evaluation  Patient identified by MRN, date of birth, ID band Patient awake    Reviewed: Allergy & Precautions, H&P , NPO status , Patient's Chart, lab work & pertinent test results  History of Anesthesia Complications Negative for: history of anesthetic complications  Airway Mallampati: II TM Distance: >3 FB Neck ROM: Full    Dental  (+) Teeth Intact and Dental Advisory Given   Pulmonary former smoker,  S/p trach in childhood for vocal cord polyps breath sounds clear to auscultation  Pulmonary exam normal       Cardiovascular hypertension, Pt. on medications Rhythm:Regular Rate:Normal  '10 stress test: no ischemia, EF 79%   Neuro/Psych negative neurological ROS     GI/Hepatic Neg liver ROS, GERD-  Medicated and Controlled,  Endo/Other  negative endocrine ROS  Renal/GU negative Renal ROS     Musculoskeletal   Abdominal   Peds  Hematology   Anesthesia Other Findings   Reproductive/Obstetrics                        Anesthesia Physical Anesthesia Plan  ASA: III  Anesthesia Plan: General   Post-op Pain Management:    Induction: Intravenous  Airway Management Planned: Double Lumen EBT  Additional Equipment: Arterial line and CVP  Intra-op Plan:   Post-operative Plan: Extubation in OR  Informed Consent: I have reviewed the patients History and Physical, chart, labs and discussed the procedure including the risks, benefits and alternatives for the proposed anesthesia with the patient or authorized representative who has indicated his/her understanding and acceptance.   Dental advisory given  Plan Discussed with: CRNA and Surgeon  Anesthesia Plan Comments: (Plan routine monitors, A line, CVP and GETA with DLT)       Anesthesia Quick Evaluation

## 2013-07-08 NOTE — Preoperative (Signed)
Beta Blockers   Reason not to administer Beta Blockers:Not Applicable 

## 2013-07-08 NOTE — H&P (Signed)
[address]       [place] [address]             [phone]                         Liesel Peckenpaugh [facility] [place] Medical Record #[id number] Date of Birth: [birth year]  Referring: Burton Apley, MD Primary Care: Burton Apley, MD  Chief Complaint:    Chief Complaint  Patient presents with  . Lung Mass    F/U to discuss PET Scan and PFT's    History of Present Illness:    [name] 67 y.o. female  Here for surgery after an  abnormal chest x-ray with right upper lobe lung mass with cavitary lesion. Since last seen PFT and PET scan have been done.  The patient is a former smoker but has not smoked for more than 35 years. She saw Dr. Su Hilt for her annual physical and mentioned some left back pain leading to a chest x-ray. The patient has noted some increasing fatigue over the past several months. She has had a 5 pound weight loss over the last 3 months and possibly 10 pounds over 6 months. She has no history of tuberculosis or exposure to known patients with tuberculosis. She has had no hemoptysis. She's had no fever or chills.  CT scan of the chest was donelast week, PETS scan done yesterday.   Current Activity/ Functional Status:  Patient is independent with mobility/ambulation, transfers, ADL's, IADL's.  Zubrod Score: At the time of surgery this patient's most appropriate activity status/level should be described as: []  Normal activity, no symptoms [x]  Symptoms, fully ambulatory []  Symptoms, in bed less than or equal to 50% of the time []  Symptoms, in bed greater than 50% of the time but less than 100% []  Bedridden []  Moribund   Past Medical History  Diagnosis Date  . Hyperlipidemia     takes Zocor daily  . Cavitating mass in right upper lung lobe   . Lung nodules     right upper lobe  . GERD (gastroesophageal reflux disease)     takes Nexium daily  . Hypertension     takes Amlodipine daily and Chlorthalidone as well  .  Dizziness     occasionally   . Arthritis     back   . History of colon polyps   . Diverticulosis   . Family history of kidney infection     pt history-saw Dr.Wrenn  . Nocturia     Past Surgical History  Procedure Laterality Date  . Cardiovascular stress test  06/29/2009    EF 79%, NO ISCHEMIA  . Tracheostomy      at age 64 or 5 d/t polyps on vocal cord  . Abdominal hysterectomy      at age 68;partial   . Colonoscopy     patient has a previous history of vocal cord polyps at age 68, she underwent a tracheostomy for which she had for 2 years. Since then she's had no difficulty with her speech or respiratory status  Family History  Problem Relation Age of Onset  . Heart attack Father   . Cancer Brother     stomach    History   Social History  . Marital Status: married    Spouse Name: N/A    Number of Children:  two  . Years of Education: N/A   Occupational History  .  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 She denies any asbestos exposure    Social History Main Topics  . Smoking status: Former Smoker -- 1.00 packs/day for 17 years    Types: Cigarettes    Quit date: 11/29/1981  . Smokeless tobacco: Never Used  . Alcohol Use: Yes     Comment: 3 BEERS DAILY  . Drug Use: No  . Sexual Activity: Not on file     History  Smoking status  . Former Smoker  . Types: Cigarettes  Smokeless tobacco  . Never Used    Comment: quit 35 yrs ago    History  Alcohol Use  . Yes    Comment: several beers a day and shots of crown     No Known Allergies  Current Facility-Administered Medications  Medication Dose Route Frequency Provider Last Rate Last Dose  . cefUROXime (ZINACEF) 1.5 g in dextrose 5 % 50 mL IVPB  1.5 g Intravenous 60 min Pre-Op Delight Ovens, MD       Facility-Administered Medications Ordered in Other Encounters  Medication Dose Route Frequency Provider Last Rate Last Dose  . lactated  ringers infusion    Continuous PRN Kristopher Key, CRNA       Prescriptions prior to admission  Medication Sig Dispense Refill  . amLODipine (NORVASC) 5 MG tablet Take 5 mg by mouth daily.      . chlorthalidone (HYGROTON) 25 MG tablet Take 25 mg by mouth daily.      . Cholecalciferol (VITAMIN D-3 PO) Take 1 tablet by mouth 2 (two) times a week.       . ciprofloxacin (CIPRO) 250 MG tablet Take 1 tablet (250 mg total) by mouth 2 (two) times daily.  20 tablet  0  . esomeprazole (NEXIUM) 40 MG capsule Take 40 mg by mouth daily before breakfast.      . Multiple Vitamins-Minerals (PRESERVISION AREDS PO) Take 1 tablet by mouth daily.       . mupirocin ointment (BACTROBAN) 2 % Place 1 application into the nose 2 (two) times daily. Apply inside of both nostrils and massage gently twice a day for 5 days  22 g  0  . potassium chloride SA (K-DUR,KLOR-CON) 20 MEQ tablet Take 1 tablet (20 mEq total) by mouth 2 (two) times daily.  20 tablet  0  . simvastatin (ZOCOR) 20 MG tablet Take 20 mg by mouth every evening.         Review of Systems:     Cardiac Review of Systems: Y or N  Chest Pain [  N  ]  Resting SOB [  N ] Exertional SOB  [ N ]  Orthopnea [ N ]   Pedal Edema [  N ]    Palpitations N  ] Syncope  Klaus.Mock  ]   Presyncope [ N ]  General Review of Systems: [Y] = yes [  ]=no Constitional: recent weight change [  ];  Wt loss over the last 3 months [ 5  ] anorexia [  ]; fatigue [Y  ]; nausea [  ]; night sweats [  ]; fever [ N ]; or chills [  ];          Dental: poor dentition[  ]; Last Dentist visit:   Eye : blurred vision [  ]; diplopia [   ]; vision changes [  ];  Amaurosis fugax[  ]; Resp: cough [  N  ];  wheezing[ N ];  hemoptysis[ N ]; shortness of breath[  ]; paroxysmal nocturnal dyspnea[  ]; dyspnea on exertion[ N ]; or orthopnea[  ];  GI:  gallstones[  ], vomiting[  ];  dysphagia[  ]; melena[  ];  hematochezia [  ]; heartburn[  ];   Hx of  Colonoscopy[ Y ]; GU: kidney stones [  ]; hematuria[  ];    dysuria [  ];  nocturia[  ];  history of     obstruction [  ]; urinary frequency [  ]             Skin: rash, swelling[  ];, hair loss[  ];  peripheral edema[  ];  or itching[  ]; Musculosketetal: myalgias[  ];  joint swelling[  ];  joint erythema[  ];  joint pain[  ];  back pain[  ];  Heme/Lymph: bruising[ N ];  bleeding[ N ];  anemia[  ];  Neuro: TIA[ N ];  headaches[  ];  stroke[  ];  vertigo[  ];  seizures[  ];   paresthesias[ N ];  difficulty walking[ N ];  Psych:depression[N  ]; anxiety[ N ];  Endocrine: diabetes[  ];  thyroid dysfunction[  ];  Immunizations: Flu up to date [ Y ]; Pneumococcal up to date Gilian.Kraft  ];  Other: Patient has had a history of "burning sensation" on the right side of her head, also complains of "bad back" for many years  Physical Exam: BP 170/82  Temp(Src) 97 F (36.1 C) (Oral)  Resp 20  SpO2 100%    General appearance: alert, cooperative, appears stated age and no distress Neurologic: intact Heart: regular rate and rhythm, S1, S2 normal, no murmur, click, rub or gallop and normal apical impulse Lungs: clear to auscultation bilaterally and normal percussion bilaterally Abdomen: soft, non-tender; bowel sounds normal; no masses,  no organomegaly Extremities: extremities normal, atraumatic, no cyanosis or edema, Homans sign is negative, no sign of DVT and no ulcers, gangrene or trophic changes PATIENT HAS NO CAROTID BRUITS, no cervical or supraclavicular adenopathy. She does have a well healed old scar from previous tracheostomy   Diagnostic Studies & Laboratory data:     Recent Radiology Findings:  Nm Pet Image Initial (pi) Skull Base To Thigh  07/01/2013   CLINICAL DATA:  Initial treatment strategy for lung mass.  EXAM: NUCLEAR MEDICINE PET SKULL BASE TO THIGH  FASTING BLOOD GLUCOSE:  Value: 108mg /dl  TECHNIQUE: 14.7 mCi W-29 FDG was injected intravenously. CT data was obtained and used for attenuation correction and anatomic localization only. (This was  not acquired as a diagnostic CT examination.) Additional exam technical data entered on technologist worksheet.  COMPARISON:  CT chest 06/19/2013.  FINDINGS: NECK  No hypermetabolic lymph nodes in the neck. CT images show no acute findings. Visualized portions of the paranasal sinuses and mastoid air cells appear clear.  CHEST  A mixed solid and ground-glass mass in the posterior right upper lobe measures 2.7 x 3.8 cm. It has ground-glass, solid and cystic components with an SUV max of 8.5. No hypermetabolic mediastinal or hilar lymph nodes.  CT images show scattered atherosclerotic calcification of the arterial vasculature. Heart size normal. No pericardial effusion. Scattered centrilobular emphysema. Sub cm nodules seen in the lungs on 06/19/2013 are too small for PET resolution. Minimal dependent atelectasis. No pleural fluid.  ABDOMEN/PELVIS  No abnormal hypermetabolic activity within the liver, pancreas, adrenal glands or spleen. No hypermetabolic lymph nodes. CT images  show mild irregularity the liver margin. Gallbladder, adrenal glands, kidneys, spleen, pancreas, stomach and bowel are grossly unremarkable. No free fluid.  SKELETON  No focal hypermetabolic activity to suggest skeletal metastasis.  IMPRESSION: 1. 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 2. Question early/mild cirrhosis.   Electronically Signed   By: Leanna Battles M.D.   On: 07/01/2013 15:52    Dg Chest 2 View  06/12/2013   CLINICAL DATA:  Chest pain and shortness of breath.  EXAM: CHEST  2 VIEW  COMPARISON:  07/03/2006  FINDINGS: There is a cavitary 3.4 x 3.9 cm mass in the posterior aspect of the right upper lobe worrisome for carcinoma. Nipple shadows of both bases. Lungs are otherwise clear. Moderate thoracolumbar scoliosis. Heart size and vascularity are normal.  IMPRESSION: Cavitary mass in the right upper lobe worrisome for malignancy. CT scan of the  chest with contrast is recommended for further evaluation.   Electronically Signed   By: Geanie Cooley M.D.   On: 06/12/2013 12:29   Ct Head Wo Contrast  06/13/2013   CLINICAL DATA:  Dizziness. Multiple falls.  EXAM: CT HEAD WITHOUT CONTRAST  TECHNIQUE: Contiguous axial images were obtained from the base of the skull through the vertex without intravenous contrast.  COMPARISON:  06/04/2008  FINDINGS: Atherosclerotic and physiologic intracranial calcifications. Mild atrophy. There is no evidence of acute intracranial hemorrhage, brain edema, mass lesion, acute infarction, mass effect, or midline shift. Acute infarct may be inapparent on noncontrast CT. No other intra-axial abnormalities are seen, and the ventricles and sulci are within normal limits in size and symmetry. No abnormal extra-axial fluid collections or masses are identified. No significant calvarial abnormality.  IMPRESSION: No bleed or other acute intracranial process.   Electronically Signed   By: Oley Balm M.D.   On: 06/13/2013 13:21   Ct Chest W Contrast  06/19/2013   CLINICAL DATA:  Cavitary mass seen in the right upper lobe on chest radiograph performed 06/12/2013. Chest CT was recommended. Patient is a former smoker.  EXAM: CT CHEST WITH CONTRAST  TECHNIQUE: Multidetector CT imaging of the chest was performed during intravenous contrast administration.  CONTRAST:  75mL OMNIPAQUE IOHEXOL 300 MG/ML  SOLN  COMPARISON:  Chest radiograph 06/12/2013  FINDINGS: No supraclavicular, mediastinal, hilar, or axillary lymphadenopathy is identified. Thyroid gland and thoracic inlet appear normal. Thoracic aorta is normal in caliber. There is mild atherosclerotic calcification of the proximal ascending thoracic aorta. Heart size is slightly prominent, with left atrial dilatation noted. The esophagus is unremarkable. Negative for pleural or pericardial effusion.  The lung windows demonstrate a background of moderate centrilobular emphysema with some  bullous changes in the right upper lobe.  In the right upper lobe is an irregular spiculated mass with central high density and peripheral ground-glass attenuation. There are cavitary changes seen along the posterior superior margin of this mass. Soft tissue mass measures approximately 2.6 x 2.5 x 3.5 cm. If the cavitary (versus pre-existing bullous) changes posterior to the soft tissue mass are included it measures approximately 3.2 x 2.5 x 3.5 cm total.  In the right upper lobe adjacent to the major fissure is a faint nodule measuring 3 mm on image number 23 of lung windows. More anteriorly in the right upper lobe is a faint 4 mm nodule on image number 26.  No nodule or airspace disease is identified in the left lung. The trachea mainstem bronchi are patent.  There are multilevel degenerative changes of the thoracic spine. There is a central superior endplate mild compression of the T9 vertebral body. No suspicious osseous lesion is identified.  The adrenal glands are normal. The imaged portion of the liver, spleen, pancreas, and left kidney are within normal limits. Focal area of scarring noted in the upper pole of the right kidney posteriorly.  IMPRESSION: 1. Findings highly suspicious for primary bronchogenic carcinoma of the right upper lobe. Soft tissue component of the mass measures 2.6 x 2.5 x 3.5 cm. PET-CT should be considered for further evaluation. 2. 2 faint pulmonary nodules in the right upper lobe measures 3 and 4 mm respectively, for which metastatic pulmonary of cannot be excluded. 3. Moderate centrilobular emphysema. 4. Central superior endplate mild compression deformity of the T9 vertebral body.   Electronically Signed   By: Britta Mccreedy M.D.   On: 06/19/2013 20:49      Recent Lab Findings: Lab Results  Component Value Date   WBC 6.1 07/03/2013   HGB 13.6 07/08/2013   HCT 40.0 07/08/2013   PLT 336 07/03/2013   GLUCOSE 110* 07/08/2013   ALT 46* 07/03/2013   AST 32 07/03/2013   NA 137  07/08/2013   K 3.5 07/08/2013   CL 94* 07/03/2013   CREATININE 0.76 07/03/2013   BUN 15 07/03/2013   CO2 26 07/03/2013   INR 0.89 07/03/2013    PFT's Done: FEV1 2.40 91 %  DLCO  20.87   73%   Assessment / Plan:   #1 CT chest findings of a 3.5 cm right upper lobe lung mass with cavitary component suspicious for malignancy Hypermetabolic right upper lobe mass is most consistent with primary bronchogenic carcinoma (cT2a cN0 cM0 or stage clinical  1 B disease)  #2 right upper lobe lung nodules x2, 3 mm and 4 mm #3 CT evidence of emphysema #4 previous history of tracheostomy at age 66 #5 hyperlipidemia treated #6 history of hypertension #7 Hypokalemia treated #8 prep UTI treated  I have seen the patient and reviewed with her and her husband the findings of the CT scan and the possible diagnosis of carcinoma the lung. 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 The risks and options of surgical treatment were discussed with the patient in detail, including the risk of death infection stroke myocardial infarction prolonged air leak. I discussed with the patient the clinical stage of what appears to be a primary carcinoma the lung.   The the patient and her husband have had their questions answered, she is willing to proceed , with bronchoscopy right video-assisted thoracoscopy lung resection and possible lymph node dissection.  The goals risks and alternatives of the planned surgical procedure Bronchoscopy, RT vATS lung resection poss node dissection  have been discussed with the patient in detail. The risks of the procedure including death, infection, stroke, myocardial infarction, bleeding, blood transfusion, prolonged air leak have all been discussed specifically.  I have quoted Gertie Gowda Botkin a 2 % of perioperative  mortality and a complication rate as high as 20%. The patient's questions have been answered.[name] is willing  to proceed with the planned procedure.  Delight Ovens MD      [address] [place] [place] [address] Office [phone]   Beeper [phone]  07/08/2013 7:09 AM

## 2013-07-08 NOTE — Transfer of Care (Signed)
Immediate Anesthesia Transfer of Care Note  Patient: [name]  Procedure(s) Performed: Procedure(s): VIDEO BRONCHOSCOPY (N/A) VIDEO ASSISTED THORACOSCOPY (VATS)/WEDGE RESECTION (Right)  Patient Location: PACU  Anesthesia Type:General  Level of Consciousness: awake, alert  and oriented  Airway & Oxygen Therapy: Patient Spontanous Breathing and Patient connected to face mask oxygen  Post-op Assessment: Report given to PACU RN  Post vital signs: Reviewed and stable  Complications: No apparent anesthesia complications

## 2013-07-09 ENCOUNTER — Inpatient Hospital Stay (HOSPITAL_COMMUNITY): Payer: Medicare Other

## 2013-07-09 ENCOUNTER — Encounter (HOSPITAL_COMMUNITY): Payer: Self-pay | Admitting: Cardiothoracic Surgery

## 2013-07-09 DIAGNOSIS — Z85118 Personal history of other malignant neoplasm of bronchus and lung: Secondary | ICD-10-CM | POA: Diagnosis present

## 2013-07-09 LAB — POCT I-STAT 3, ART BLOOD GAS (G3+)
Acid-Base Excess: 3 mmol/L — ABNORMAL HIGH (ref 0.0–2.0)
Bicarbonate: 27.1 mEq/L — ABNORMAL HIGH (ref 20.0–24.0)
O2 Saturation: 98 %
Patient temperature: 98
TCO2: 28 mmol/L (ref 0–100)
pCO2 arterial: 38.3 mmHg (ref 35.0–45.0)
pH, Arterial: 7.456 — ABNORMAL HIGH (ref 7.350–7.450)
pO2, Arterial: 97 mmHg (ref 80.0–100.0)

## 2013-07-09 LAB — BASIC METABOLIC PANEL
BUN: 8 mg/dL (ref 6–23)
Calcium: 8.7 mg/dL (ref 8.4–10.5)
GFR calc Af Amer: >90 mL/min (ref >90)
GFR calc non Af Amer: 88 mL/min — ABNORMAL LOW (ref >90)
Glucose, Bld: 131 mg/dL — ABNORMAL HIGH (ref 70–99)
Potassium: 3.1 mEq/L — ABNORMAL LOW (ref 3.5–5.1)
Sodium: 135 mEq/L (ref 135–145)

## 2013-07-09 LAB — CBC
HCT: 33.9 % — ABNORMAL LOW (ref 36.0–46.0)
MCH: 33.4 pg (ref 26.0–34.0)
MCHC: 34.8 g/dL (ref 30.0–36.0)
Platelets: 288 10*3/uL (ref 150–400)
RDW: 12.5 % (ref 11.5–15.5)
WBC: 12.5 10*3/uL — ABNORMAL HIGH (ref 4.0–10.5)

## 2013-07-09 LAB — URINE CULTURE
Colony Count: NO GROWTH
Culture: NO GROWTH
Special Requests: NORMAL

## 2013-07-09 MED ORDER — POTASSIUM CHLORIDE 10 MEQ/50ML IV SOLN
10.0000 meq | INTRAVENOUS | Status: AC | PRN
  Administered 2013-07-09 (×3): 10 meq via INTRAVENOUS
  Filled 2013-07-09 (×2): qty 50

## 2013-07-09 MED ORDER — ENOXAPARIN SODIUM 30 MG/0.3ML ~~LOC~~ SOLN
30.0000 mg | SUBCUTANEOUS | Status: DC
  Administered 2013-07-09 – 2013-07-14 (×6): 30 mg via SUBCUTANEOUS
  Filled 2013-07-09 (×8): qty 0.3

## 2013-07-09 NOTE — Progress Notes (Signed)
Patient ID: Unk Lightning, female   DOB: August 20, 1945, 67 y.o.   MRN: [id number]   SICU Evening Rounds:   Hemodynamically stable    Urine output good   CT output low  CBC    Component Value Date/Time   WBC 12.5* 07/09/2013 0500   RBC 3.53* 07/09/2013 0500   HGB 11.8* 07/09/2013 0500   HCT 33.9* 07/09/2013 0500   PLT 288 07/09/2013 0500   MCV 96.0 07/09/2013 0500   MCH 33.4 07/09/2013 0500   MCHC 34.8 07/09/2013 0500   RDW 12.5 07/09/2013 0500     BMET    Component Value Date/Time   NA 135 07/09/2013 0500   K 3.1* 07/09/2013 0500   CL 98 07/09/2013 0500   CO2 26 07/09/2013 0500   GLUCOSE 131* 07/09/2013 0500   BUN 8 07/09/2013 0500   CREATININE 0.68 07/09/2013 0500   CALCIUM 8.7 07/09/2013 0500   GFRNONAA 88* 07/09/2013 0500   GFRAA >90 07/09/2013 0500     A/P:  Stable postop course. Continue current plans

## 2013-07-09 NOTE — Progress Notes (Addendum)
      [address]       [name] [address]             [phone]       1 Day Post-Op Procedure(s) (LRB): VIDEO BRONCHOSCOPY (N/A) VIDEO ASSISTED THORACOSCOPY (VATS)/WEDGE RESECTION (Right)  Subjective: Patient with brief nausea earlier after sitting on edge of bed. Denies emesis or abdominal pain. Feeling better now.  Objective: Vital signs in last 24 hours: Temp:  [97.4 F (36.3 C)-98.7 F (37.1 C)] 97.4 F (36.3 C) (12/02 0736) Pulse Rate:  [64-86] 69 (12/02 0700) Cardiac Rhythm:  [-] Normal sinus rhythm (12/02 0400) Resp:  [6-24] 6 (12/02 0700) BP: (113-156)/(57-99) 125/60 mmHg (12/02 0700) SpO2:  [89 %-100 %] 97 % (12/02 0700) Arterial Line BP: (131-183)/(54-79) 144/61 mmHg (12/02 0700) Weight:  [63 kg (138 lb 14.2 oz)] 63 kg (138 lb 14.2 oz) (12/02 0500)   Hemodynamic parameters for last 24 hours:    Intake/Output from previous day: 12/01 0701 - 12/02 0700 In: 4833.3 [P.O.:480; I.V.:4303.3; IV Piggyback:50] Out: 2550 [Urine:2070; Blood:150; Chest Tube:330]   Physical Exam:  Cardiovascular: RRR Pulmonary: Slightly diminished at bases R>L; no rales, wheezes, or rhonchi. Abdomen: Soft, non tender, bowel sounds present. Extremities: No lower extremity edema. Wounds: Dressing is clean and dry.  Chest Tubes: to suction  Lab Results: CBC: Recent Labs  07/08/13 0613 07/09/13 0500  WBC  --  12.5*  HGB 13.6 11.8*  HCT 40.0 33.9*  PLT  --  288   BMET:  Recent Labs  07/08/13 0613 07/09/13 0500  NA 137 135  K 3.5 3.1*  CL  --  98  CO2  --  26  GLUCOSE 110* 131*  BUN  --  8  CREATININE  --  0.68  CALCIUM  --  8.7    PT/INR: No results found for this basename: LABPROT, INR,  in the last 72 hours ABG:  INR: Will add last result for INR, ABG once components are confirmed Will add last 4 CBG results once components are confirmed  Assessment/Plan:  1. CV - SR. On Norvasc 5 daily. 2.  Pulmonary - Chest tubes with 330 cc of output  since surgery. There appears to be an intermittent, small air leak with cough. CXR this am appears to show no pneumothorax and bibasilar atelectasis. Leave chest tubes for now. 3. Anemia - H and H 11.8 and 33.9 4. Hypokalemia - being supplemented via runs.  5. Remove a line 6. Remove foley in am 7. Decrease IVF after lunch, as long as tolerates diet  ZIMMERMAN,DONIELLE MPA-C 07/09/2013,7:43 AM  I have seen and examined [name] and agree with the above assessment  and plan.  Delight Ovens MD Beeper [phone] Office [phone] 07/09/2013 8:08 AM

## 2013-07-09 NOTE — Plan of Care (Signed)
Problem: Phase I Progression Outcomes Goal: Initial discharge plan identified Outcome: Completed/Met Date Met:  07/09/13 Home with spouse  Problem: Phase II Progression Outcomes Goal: Discharge plan established Outcome: Adequate for Discharge Home with spouse.

## 2013-07-10 ENCOUNTER — Inpatient Hospital Stay (HOSPITAL_COMMUNITY): Payer: Medicare Other

## 2013-07-10 ENCOUNTER — Encounter (HOSPITAL_COMMUNITY): Payer: Self-pay | Admitting: Cardiothoracic Surgery

## 2013-07-10 DIAGNOSIS — E43 Unspecified severe protein-calorie malnutrition: Secondary | ICD-10-CM | POA: Insufficient documentation

## 2013-07-10 LAB — COMPREHENSIVE METABOLIC PANEL
ALT: 21 U/L (ref 0–35)
AST: 31 U/L (ref 0–37)
Albumin: 3.3 g/dL — ABNORMAL LOW (ref 3.5–5.2)
Alkaline Phosphatase: 89 U/L (ref 39–117)
CO2: 30 mEq/L (ref 19–32)
Calcium: 9.1 mg/dL (ref 8.4–10.5)
Creatinine, Ser: 0.67 mg/dL (ref 0.50–1.10)
Potassium: 2.6 mEq/L — CL (ref 3.5–5.1)
Sodium: 134 mEq/L — ABNORMAL LOW (ref 135–145)
Total Protein: 6.2 g/dL (ref 6.0–8.3)

## 2013-07-10 LAB — CULTURE, RESPIRATORY W GRAM STAIN
Culture: NO GROWTH
Gram Stain: NONE SEEN

## 2013-07-10 LAB — CBC
Hemoglobin: 12.2 g/dL (ref 12.0–15.0)
MCH: 33.5 pg (ref 26.0–34.0)
MCHC: 34.4 g/dL (ref 30.0–36.0)
Platelets: 298 10*3/uL (ref 150–400)
RBC: 3.64 MIL/uL — ABNORMAL LOW (ref 3.87–5.11)
RDW: 12.8 % (ref 11.5–15.5)

## 2013-07-10 MED ORDER — DIPHENHYDRAMINE HCL 25 MG PO CAPS
25.0000 mg | ORAL_CAPSULE | Freq: Every evening | ORAL | Status: DC | PRN
  Administered 2013-07-13 – 2013-07-15 (×2): 25 mg via ORAL
  Filled 2013-07-10 (×2): qty 1

## 2013-07-10 MED ORDER — POTASSIUM CHLORIDE 10 MEQ/50ML IV SOLN
10.0000 meq | INTRAVENOUS | Status: AC | PRN
  Administered 2013-07-10 (×3): 10 meq via INTRAVENOUS

## 2013-07-10 MED ORDER — ENSURE COMPLETE PO LIQD
237.0000 mL | Freq: Two times a day (BID) | ORAL | Status: DC
  Administered 2013-07-10 – 2013-07-14 (×8): 237 mL via ORAL

## 2013-07-10 MED ORDER — ZOLPIDEM TARTRATE 5 MG PO TABS: 5.0000 mg | ORAL_TABLET | Freq: Every evening | ORAL | Status: DC | PRN

## 2013-07-10 MED ORDER — DIPHENHYDRAMINE HCL 25 MG PO CAPS: 25.0000 mg | ORAL_CAPSULE | Freq: Every evening | ORAL | Status: DC | PRN

## 2013-07-10 MED ORDER — POTASSIUM CHLORIDE 10 MEQ/50ML IV SOLN
10.0000 meq | INTRAVENOUS | Status: AC
  Administered 2013-07-10 (×3): 10 meq via INTRAVENOUS
  Filled 2013-07-10 (×3): qty 50

## 2013-07-10 MED ORDER — POTASSIUM CHLORIDE CRYS ER 20 MEQ PO TBCR: 40.0000 meq | EXTENDED_RELEASE_TABLET | Freq: Once | ORAL | Status: DC

## 2013-07-10 NOTE — Care Management Note (Signed)
    Page 1 of 1   07/10/2013     2:11:33 PM   CARE MANAGEMENT NOTE 07/10/2013  Patient:  [name]   Account Number:  [id number]  Date Initiated:  07/08/2013  Documentation initiated by:  [facility]  Subjective/Objective Assessment:   Post op VATS and wedge resection for CA.     Action/Plan:   Anticipated DC Date:  07/12/2013   Anticipated DC Plan:  HOME W HOME HEALTH SERVICES      DC Planning Services  CM consult      Choice offered to / List presented to:             Status of service:  In process, will continue to follow Medicare Important Message given?   (If response is "NO", the following Medicare IM given date fields will be blank) Date Medicare IM given:   Date Additional Medicare IM given:    Discharge Disposition:    Per UR Regulation:  Reviewed for med. necessity/level of care/duration of stay  If discussed at Long Length of Stay Meetings, dates discussed:    Comments:  ContactFalisa, [name] [id number]   [phone]  07-09-13 1:50pm [name], RNBSN [phone] Talked with patient and husband in room.  Husband will be with patient 24/7 on discharge.  Have access to RW if needed on dc and have an electric w/c.  CM will continue to follow for further needs.

## 2013-07-10 NOTE — Progress Notes (Signed)
CRITICAL VALUE ALERT  Critical value received: potassium 2.6  Date of notification:  07/10/2013  Time of notification:  0600  Critical value read back:yes  Nurse who received alert:  Burna Cash  MD notified (1st page):  Protocol  Time of first page:  na  MD notified (2nd page):  Time of second page:  Responding MD:    Time MD responded:

## 2013-07-10 NOTE — Progress Notes (Signed)
Patient ID: Unk Lightning, female   DOB: Mar 16, 1946, 67 y.o.   MRN: [id number] EVENING ROUNDS NOTE :     [address]       [place] [address]             [phone]                 2 Days Post-Op Procedure(s) (LRB): VIDEO BRONCHOSCOPY (N/A) VIDEO ASSISTED THORACOSCOPY (VATS)/WEDGE RESECTION (Right)  Total Length of Stay:  LOS: 2 days  BP 149/72  Pulse 86  Temp(Src) 98.7 F (37.1 C) (Oral)  Resp 17  Wt 139 lb 15.9 oz (63.5 kg)  SpO2 100%  .Intake/Output     12/02 0701 - 12/03 0700 12/03 0701 - 12/04 0700   P.O. 1200 440   I.V. (mL/kg) 956 (15.1) 100 (1.6)   IV Piggyback 150 200   Total Intake(mL/kg) 2306 (36.3) 740 (11.7)   Urine (mL/kg/hr) 1475 (1) 1425 (1.9)   Blood     Chest Tube 240 (0.2) 60 (0.1)   Total Output 1715 1485   Net +591 -745          . dextrose 5 % and 0.9% NaCl 20 mL/hr (07/10/13 0748)     Lab Results  Component Value Date   WBC 13.0* 07/10/2013   HGB 12.2 07/10/2013   HCT 35.5* 07/10/2013   PLT 298 07/10/2013   GLUCOSE 117* 07/10/2013   ALT 21 07/10/2013   AST 31 07/10/2013   NA 134* 07/10/2013   K 2.6* 07/10/2013   CL 93* 07/10/2013   CREATININE 0.67 07/10/2013   BUN 6 07/10/2013   CO2 30 07/10/2013   INR 0.89 07/03/2013   Stable day Waiting for step down bed  Delight Ovens MD  Beeper [phone] Office [phone] 07/10/2013 6:36 PM

## 2013-07-10 NOTE — Progress Notes (Addendum)
      [address]       [name] [address]             [phone]       2 Days Post-Op Procedure(s) (LRB): VIDEO BRONCHOSCOPY (N/A) VIDEO ASSISTED THORACOSCOPY (VATS)/WEDGE RESECTION (Right)  Subjective: Patient did not sleep well as had to urinate frequently. Denies nausea or emesis this am, but not much appetite.  Objective: Vital signs in last 24 hours: Temp:  [97.3 F (36.3 C)-98.1 F (36.7 C)] 98.1 F (36.7 C) (12/03 0715) Pulse Rate:  [71-85] 76 (12/03 0700) Cardiac Rhythm:  [-] Normal sinus rhythm (12/03 0600) Resp:  [7-20] 12 (12/03 0700) BP: (112-171)/(59-78) 153/78 mmHg (12/03 0400) SpO2:  [92 %-100 %] 94 % (12/03 0700) Arterial Line BP: (134)/(56) 134/56 mmHg (12/02 0800) Weight:  [63.5 kg (139 lb 15.9 oz)] 63.5 kg (139 lb 15.9 oz) (12/03 0400)     Intake/Output from previous day: 12/02 0701 - 12/03 0700 In: 2306 [P.O.:1200; I.V.:956; IV Piggyback:150] Out: 1715 [Urine:1475; Chest Tube:240]   Physical Exam:  Cardiovascular: RRR Pulmonary: Slightly diminished at bases R>L; no rales, wheezes, or rhonchi. Abdomen: Soft, non tender, bowel sounds present. Extremities: SCDs in place Wounds: Wound is clean and dry  Chest Tubes: to water seal  Lab Results: CBC:  Recent Labs  07/09/13 0500 07/10/13 0400  WBC 12.5* 13.0*  HGB 11.8* 12.2  HCT 33.9* 35.5*  PLT 288 298   BMET:   Recent Labs  07/09/13 0500 07/10/13 0400  NA 135 134*  K 3.1* 2.6*  CL 98 93*  CO2 26 30  GLUCOSE 131* 117*  BUN 8 6  CREATININE 0.68 0.67  CALCIUM 8.7 9.1    PT/INR: No results found for this basename: LABPROT, INR,  in the last 72 hours ABG:  INR: Will add last result for INR, ABG once components are confirmed Will add last 4 CBG results once components are confirmed  Assessment/Plan:  1. CV - SR. Hypertensive.On Norvasc 5 daily and Hygroton 25 daily. May need to increase Norvasc. 2.  Pulmonary - Chest tubes with 240 cc of output since  surgery. There appears to be an intermittent, small air leak with cough. 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 I did not discuss with patient. 3. Anemia - H and H increased to12 and 35.5 4. Hypokalemia - being supplemented via runs. Will give additional runs as is not able to swallow large pills 5.Heplock IVF 6.Benadryl PRN sleep 7.Transfer to 3300 when bed available   ZIMMERMAN,DONIELLE MPA-C 07/10/2013,7:35 AM  Path discussed with patient yesterday, two separate tumors found one adeno and one squamous, all node negative I have seen and examined Unk Lightning and agree with the above assessment  and plan.  Delight Ovens MD Beeper [phone] Office [phone] 07/10/2013 8:51 AM

## 2013-07-10 NOTE — Op Note (Signed)
NAMEQUANTIA, GRULLON                ACCOUNT NO.:  [id number]  MEDICAL RECORD NO.:  [id number]  LOCATION:  2S15C                        FACILITY:  [facility]  PHYSICIAN:  Sheliah Plane, MD    DATE OF BIRTH:  07/27/1946  DATE OF PROCEDURE:  07/08/2013 DATE OF DISCHARGE:                              OPERATIVE REPORT   PREOPERATIVE DIAGNOSIS:  Right upper lobe lung mass.  POSTOPERATIVE DIAGNOSIS:  Carcinoma of the right upper lobe.  PROCEDURE PERFORMED:  Bronchoscopy, right video-assisted thoracoscopy, wedge resection, right upper lobe, with completion of right upper lobectomy, lymph node dissection, and placement of On-Q device.  SURGEON:  Sheliah Plane, MD  FIRST ASSISTANT:  Rowe Clack, P.A.-C.  BRIEF HISTORY:  The patient is a 67 year old female with a previous history of smoking, but none for the last 37 years, who presented with some weight loss, and at the time of routine physical exam, a chest x- ray was performed that demonstrated a 3 cm right upper lobe cavitary lesion.  Evaluation with PET scan showed this area to be hypermetabolic without evidence of other areas of hypermetabolic activity.  Surgical resection was recommended to the patient who agreed and signed informed consent after discussion of risks and options, possible diagnosis.  DESCRIPTION OF PROCEDURE:  After appropriately marking the right side up, the patient was taken to the operating room, underwent general endotracheal anesthesia without incident.  Through a single-lumen endotracheal tube, fiberoptic bronchoscope was passed both down the right and left tracheobronchial tree to the subsegmental level.  No endobronchial lesions were appreciated.  Bronchial washings for culture were obtained.  The scope was removed.  The double-lumen endotracheal tube was placed.  The patient was turned in lateral decubitus position with the right side up.  Appropriate second time-out was performed.  The right chest was  prepped with Betadine and draped in sterile manner. Using a port site was created approximately at the 4th intercostal space, posterior axillary line, the right lung was deflated.  Scope was passed in the pleural space examined.  There were no obvious pleural metastasis, areas of bullous disease were noted in the upper lobe. There were adhesions between the middle and upper lobe.  The fissures were not well developed.  Additional port sites were created slightly lower, anterior, and posterior.  The original port site was expanded slightly to allow palpation of the lung and locate the mass through the 2 port sites in the small incision.  A wedge resection of the right upper lobe was done and submitted to Pathology.  The frozen section was consistent with carcinoma, although the margin was negative.  The patient had good cardiopulmonary function and we proceeded with a right upper lobectomy and lymph node dissection.  Initially, the right upper lobe vein was dissected free with __________ and encircled with a blue tape.  Gold tip vascular staple was used to divide the right upper lobe vein.  We then continued superiorly and 2 pulmonary artery branches to the upper lobe reached and divided in a similar fashion.  There was one remaining upper lobe branch arterial branch.  The right upper lobe bronchus was identified and stapled with the black stapler.  This left good exposure of the remaining right upper lobe arterial branch which was also divided.  The fissures, both major and minor were not very complete.  Using a purple stapler, the fissures were divided.  Prior to dividing the bronchus with the bronchus clamped with the stapler, the lung was inflated.  The middle and lower lobe inflated and help demarcate the fissures.  With the fissures divided, the specimen was placed in an Endobag and removed through the incision.  We then continued with lymph node dissection.  There was a large 10R and  11R lymph nodes which were dissected, 12 and 13 are lymph nodes were submitted.  The triangle above the azygous vein was dissected out. Taking care to avoid any nerve injury, a 4R lymph node was removed. Each of these lymph node stations was submitted in separate cups to Pathology.  Frozen section on the bronchial margin was clear.  The previous wedge resection stapled margin was marked separately from the stapled margins along the major and minor fissure.  The bronchial stump was tested and was free of any air leak.  Coseal was placed on the bronchial stump and long minor air leak of the middle lobe.  An On-Q device through a separate site along the posterior ribs was tunneled subpleurally under guidance with the video scope and secured in place. Anterior 28 chest tube was placed through 1 port site and a Blake drain placed through the more posterior port site and incision was then closed with interrupted 0 Vicryl in the muscle layers, running 2-0 Vicryl in the subcutaneous tissue, and a 3-0 subcuticular stitch in skin edges. Dry dressings were applied.  Sponge and needle count was reported as correct at completion of the procedure.  The middle and lower lobe inflated nicely prior to closure of the chest.  Estimated blood loss was 100 mL.  The patient tolerated the procedure without obvious complication, was extubated in the operating room, and transferred to the recovery room for further postoperative care.     Sheliah Plane, MD     EG/MEDQ  D:  07/09/2013  T:  07/09/2013  Job:  [id number]  cc:   Antony Madura, M.D.

## 2013-07-10 NOTE — Progress Notes (Signed)
INITIAL NUTRITION ASSESSMENT  DOCUMENTATION CODES Per approved criteria  -Severe malnutrition in the context of chronic illness   INTERVENTION:  Ensure Complete twice daily (350 kcals, 13 gm protein per 8 fl oz bottle) RD to follow for nutrition care plan  NUTRITION DIAGNOSIS: Increased nutrient needs related to catabolic illness, post-op state as evidenced by estimated nutrition needs  Goal: Pt to meet >/= 90% of their estimated nutrition needs   Monitor:  PO & supplemental intake, weight, labs, I/O's  Reason for Assessment: Malnutrition Screening Tool Report  67 y.o. female  Admitting Dx: carcinoma of the right upper lobe  ASSESSMENT: Patient presented for surgery after abnormal chest X-ray with right upper lobe lung mass with cavitary lesion.   Patient s/p procedure 12/1: VIDEO BRONCHOSCOPY  VIDEO ASSISTED THORACOSCOPY (VATS)/WEDGE RESECTION (Right)  RD spoke with patient's husband; reports patient had not been eating well for the past month; he noticed patient would eat smaller portions when they would go out to eat and sometimes didn't want anything; he also states Ms. Medlock has lost ~ 10 lbs in the last 2-3 months; would benefit from addition of supplements -- RD to order.  Patient meets criteria for severe malnutrition in the context of chronic illness as evidenced by < 75% intake of estimated energy requirement for > 1 month and 7% weight loss x 2-3 months.  Height: Ht Readings from Last 1 Encounters:  07/03/13 5\' 4"  (1.626 m)    Weight: Wt Readings from Last 1 Encounters:  07/10/13 139 lb 15.9 oz (63.5 kg)    Ideal Body Weight: 120 lb  % Ideal Body Weight: 116%  Wt Readings from Last 10 Encounters:  07/10/13 139 lb 15.9 oz (63.5 kg)  07/10/13 139 lb 15.9 oz (63.5 kg)  07/03/13 135 lb 6.4 oz (61.417 kg)  07/02/13 135 lb (61.236 kg)  06/24/13 135 lb (61.236 kg)    Usual Body Weight: ~ 149 lb  % Usual Body Weight: 93%  BMI:  Body mass index is  24.02 kg/(m^2).  Estimated Nutritional Needs: Kcal: 1800-2000 Protein: 90-100 gm Fluid: 1.8-2.0 L  Skin: chest incision   Diet Order: General  EDUCATION NEEDS: -No education needs identified at this time   Intake/Output Summary (Last 24 hours) at 07/10/13 1338 Last data filed at 07/10/13 1301  Gross per 24 hour  Intake 1983.5 ml  Output   2690 ml  Net -706.5 ml    Labs:   Recent Labs Lab 07/08/13 0613 07/09/13 0500 07/10/13 0400  NA 137 135 134*  K 3.5 3.1* 2.6*  CL  --  98 93*  CO2  --  26 30  BUN  --  8 6  CREATININE  --  0.68 0.67  CALCIUM  --  8.7 9.1  GLUCOSE 110* 131* 117*    Scheduled Meds: . amLODipine  5 mg Oral Daily  . bisacodyl  10 mg Oral Daily  . chlorthalidone  25 mg Oral Daily  . enoxaparin (LOVENOX) injection  30 mg Subcutaneous Q24H  . fentaNYL   Intravenous Q4H  . pantoprazole  40 mg Oral Daily  . potassium chloride  10 mEq Intravenous Q1 Hr x 3  . simvastatin  20 mg Oral QPM    Continuous Infusions: . bupivacaine 0.5 % ON-Q pump SINGLE CATH 400 mL    . dextrose 5 % and 0.9% NaCl 20 mL/hr (07/10/13 1610)    Past Medical History  Diagnosis Date  . Hyperlipidemia     takes Zocor daily  .  Cavitating mass in right upper lung lobe   . Lung nodules     right upper lobe  . GERD (gastroesophageal reflux disease)     takes Nexium daily  . Hypertension     takes Amlodipine daily and Chlorthalidone as well  . Dizziness     occasionally   . Arthritis     back   . History of colon polyps   . Diverticulosis   . Family history of kidney infection     pt history-saw Dr.Wrenn  . Nocturia   . Lung cancer, Right upper lobe     Past Surgical History  Procedure Laterality Date  . Cardiovascular stress test  06/29/2009    EF 79%, NO ISCHEMIA  . Tracheostomy      at age 28 or 5 d/t polyps on vocal cord  . Abdominal hysterectomy      at age 53;partial   . Colonoscopy    . Video bronchoscopy N/A 07/08/2013    Procedure: VIDEO  BRONCHOSCOPY;  Surgeon: Delight Ovens, MD;  Location: [facility] OR;  Service: Thoracic;  Laterality: N/A;  . Video assisted thoracoscopy (vats)/wedge resection Right 07/08/2013    Procedure: VIDEO ASSISTED THORACOSCOPY (VATS)/WEDGE RESECTION;  Surgeon: Delight Ovens, MD;  Location: [facility] OR;  Service: Thoracic;  Laterality: Right;    Maureen Chatters, RD, LDN Pager #: [phone] After-Hours Pager #: [phone]

## 2013-07-11 ENCOUNTER — Inpatient Hospital Stay (HOSPITAL_COMMUNITY): Payer: Medicare Other

## 2013-07-11 LAB — BASIC METABOLIC PANEL
BUN: 8 mg/dL (ref 6–23)
CO2: 29 mEq/L (ref 19–32)
Calcium: 9.6 mg/dL (ref 8.4–10.5)
Chloride: 91 mEq/L — ABNORMAL LOW (ref 96–112)
GFR calc non Af Amer: 86 mL/min — ABNORMAL LOW (ref >90)
Glucose, Bld: 114 mg/dL — ABNORMAL HIGH (ref 70–99)
Sodium: 132 mEq/L — ABNORMAL LOW (ref 135–145)

## 2013-07-11 LAB — CBC
HCT: 36 % (ref 36.0–46.0)
Hemoglobin: 12.4 g/dL (ref 12.0–15.0)
MCH: 33.2 pg (ref 26.0–34.0)
MCHC: 34.4 g/dL (ref 30.0–36.0)
MCV: 96.5 fL (ref 78.0–100.0)
Platelets: 316 10*3/uL (ref 150–400)
RBC: 3.73 MIL/uL — ABNORMAL LOW (ref 3.87–5.11)
RDW: 12.6 % (ref 11.5–15.5)
WBC: 13.1 10*3/uL — ABNORMAL HIGH (ref 4.0–10.5)

## 2013-07-11 MED ORDER — POTASSIUM CHLORIDE 10 MEQ/50ML IV SOLN
10.0000 meq | INTRAVENOUS | Status: AC
  Administered 2013-07-11 (×5): 10 meq via INTRAVENOUS
  Filled 2013-07-11 (×4): qty 50

## 2013-07-11 MED ORDER — SODIUM CHLORIDE 0.9 % IJ SOLN
10.0000 mL | Freq: Two times a day (BID) | INTRAMUSCULAR | Status: DC
  Administered 2013-07-12 – 2013-07-14 (×3): 10 mL

## 2013-07-11 MED ORDER — POLYETHYLENE GLYCOL 3350 17 G PO PACK
17.0000 g | PACK | Freq: Once | ORAL | Status: AC
  Administered 2013-07-11: 17 g via ORAL
  Filled 2013-07-11: qty 1

## 2013-07-11 MED ORDER — SODIUM CHLORIDE 0.9 % IJ SOLN
10.0000 mL | INTRAMUSCULAR | Status: DC | PRN
  Administered 2013-07-12 (×2): 20 mL
  Administered 2013-07-13 (×2): 10 mL

## 2013-07-11 NOTE — Plan of Care (Signed)
Problem: Phase III Progression Outcomes Goal: Transferred to Telemetry Outcome: Completed/Met Date Met:  07/11/13 Transferred 1O10 07/11/13 ~9604

## 2013-07-11 NOTE — Progress Notes (Addendum)
      [address]       [name] [address]             [phone]       3 Days Post-Op Procedure(s) (LRB): VIDEO BRONCHOSCOPY (N/A) VIDEO ASSISTED THORACOSCOPY (VATS)/WEDGE RESECTION (Right)  Subjective: Patient slept better last evening. Has already walked.  Objective: Vital signs in last 24 hours: Temp:  [98.1 F (36.7 C)-98.7 F (37.1 C)] 98.1 F (36.7 C) (12/04 0000) Pulse Rate:  [67-92] 70 (12/04 0700) Cardiac Rhythm:  [-] Normal sinus rhythm (12/03 2000) Resp:  [7-22] 11 (12/04 0700) BP: (103-186)/(46-82) 129/67 mmHg (12/04 0700) SpO2:  [92 %-100 %] 95 % (12/04 0700)     Intake/Output from previous day: 12/03 0701 - 12/04 0700 In: 970 [P.O.:440; I.V.:330; IV Piggyback:200] Out: 2660 [Urine:2575; Chest Tube:85]   Physical Exam:  Cardiovascular: RRR Pulmonary: Slightly diminished at bases R>L; no rales, wheezes, or rhonchi. Abdomen: Soft, non tender, bowel sounds present. Extremities: SCDs in place Wound: Wound is clean and dry  Chest Tube: to water seal  Lab Results: CBC:  Recent Labs  07/10/13 0400 07/11/13 0425  WBC 13.0* 13.1*  HGB 12.2 12.4  HCT 35.5* 36.0  PLT 298 316   BMET:   Recent Labs  07/10/13 0400 07/11/13 0425  NA 134* 132*  K 2.6* 2.8*  CL 93* 91*  CO2 30 29  GLUCOSE 117* 114*  BUN 6 8  CREATININE 0.67 0.72  CALCIUM 9.1 9.6    PT/INR: No results found for this basename: LABPROT, INR,  in the last 72 hours ABG:  INR: Will add last result for INR, ABG once components are confirmed Will add last 4 CBG results once components are confirmed  Assessment/Plan:  1. CV - SR. On Norvasc 5 daily and Hygroton 25 daily.  2.  Pulmonary - Chest tubes with 85 cc of output since surgery. There is no air leak. CXR this am shows no pneumothorax and bibasilar atelectasis.Hope to remove remaining chest tube 3. Anemia resolved - H and H increased to12.4 and 36 4. Hypokalemia - being supplemented via runs. Will give  additional runs as is not able to swallow large pills 5. Will stop PCA after last tube removed 6.LOC constipation 7.Transfer to 3300 when bed available;will check if 2000 has bed available   ZIMMERMAN,[name] 07/11/2013,7:33 AM     path reviewed with patient, discussed at [facility] conference this am no further rx recommended  D/c chest tube today I have seen and examined [name] and agree with the above assessment  and plan.  Delight Ovens MD Beeper [phone] Office [phone] 07/11/2013 8:19 AM

## 2013-07-11 NOTE — Progress Notes (Signed)
Dr Tyrone Sage updated remaining R chest tube d/c per order, tolerated well. With ambulation and post chest tube pull pt reports sharp pain under right breast, not sustained. Fentanyl PCA d/c per order. No increase work of breathing, O2 sat >90% on room air, equal breath sounds. Order for CXR pre transfer per MD. Will continue to monitor. Koren Bound

## 2013-07-11 NOTE — Progress Notes (Signed)
Pt transferred to 2W24, pt ambulated distance, tolerated well. Pt transferred on portable tele, room air. Receiving RN at bedside, placed on receiving units tele. Family present for transfer. Pt glasses/belongings sent. Will continue to monitor. Koren Bound

## 2013-07-11 NOTE — Progress Notes (Signed)
Dr Tyrone Sage updated CXR impression report, post chest tube pull. [name]

## 2013-07-11 NOTE — Progress Notes (Signed)
2.5 ml waste from fentanyl PCA syringe with PCA d/c per MD order. Waste volume in sink. Linton Flemings RN witness waste. Koren Bound

## 2013-07-12 ENCOUNTER — Inpatient Hospital Stay (HOSPITAL_COMMUNITY): Payer: Medicare Other

## 2013-07-12 ENCOUNTER — Other Ambulatory Visit: Payer: Self-pay | Admitting: *Deleted

## 2013-07-12 LAB — BASIC METABOLIC PANEL
CO2: 31 mEq/L (ref 19–32)
Calcium: 9.4 mg/dL (ref 8.4–10.5)
Chloride: 92 mEq/L — ABNORMAL LOW (ref 96–112)
Creatinine, Ser: 0.83 mg/dL (ref 0.50–1.10)
GFR calc non Af Amer: 71 mL/min — ABNORMAL LOW (ref >90)
Glucose, Bld: 95 mg/dL (ref 70–99)
Potassium: 3 mEq/L — ABNORMAL LOW (ref 3.5–5.1)
Sodium: 132 mEq/L — ABNORMAL LOW (ref 135–145)

## 2013-07-12 MED ORDER — OXYCODONE-ACETAMINOPHEN 5-325 MG PO TABS
1.0000 | ORAL_TABLET | ORAL | Status: DC | PRN
Start: 2013-07-12 — End: 2013-07-18

## 2013-07-12 NOTE — Discharge Summary (Signed)
Physician Discharge Summary  Patient ID: [name] MRN: [id number] DOB/AGE: [birth year] 67 y.o.  Admit date: 07/08/2013 Discharge date: 07/15/2013  Admission Diagnoses:  Patient Active Problem List   Diagnosis Date Noted  . Protein-calorie malnutrition, severe 07/10/2013  . Cancer of upper lobe of lung 07/09/2013  . S/P lobectomy of lung 07/08/2013  . Hyperlipidemia   . Hypertension   . Lung cancer, Right upper lobe    Discharge Diagnoses:   Patient Active Problem List   Diagnosis Date Noted  . Protein-calorie malnutrition, severe 07/10/2013  . Cancer of upper lobe of lung 07/09/2013  . S/P lobectomy of lung 07/08/2013  . Hyperlipidemia   . Hypertension   . Lung cancer, Right upper lobe    Discharged Condition: good  History of Present Illness:   [name] is a 67 yo white female with previous smoking history.  She presented to her PCP for an annual physical at which time she complained of some left sided back pain.  CXR was obtained and showed a right upper lobe lung mass with presence of a cavitary lesion.  Further workup with CT scan showed a Soft tissue component of the mass measures 2.6 x 2.5 x 3.5 cm which was felt to be highly suspicious for bronchogenic carcinoma.  Due to this finding the patient was referred to Dr. Tyrone Sage for further evaluation.  She was initially seen on 06/24/2013 at which time patient states that she has noticed she was more fatigued than usual.  She also states she has experienced some weight loss of about 10 lbs over the last 6 months.  She denies hemoptysis, fevers, or chills.  Her imaging studies were reviewed and it was felt the patient should undergo PET CT and PFTS prior to proceeding with surgical intervention.  PET CT scan showed hypermetabolic activity of the right upper lobe mass most consistent with primary bronchogenic carcinoma.  Due to this it was felt the patient should proceed with surgical resection of the lung mass.  The risks  and benefits of the procedure were explained to the patient and she was agreeable to proceed.    Hospital Course:   [name] presented to [facility] on 07/08/2013.  She was taken to the operating room and underwent Right Video Assisted Thoracoscopy, Right Upper Lobectomy and Lymph Node Dissection.  The patient tolerated the procedure well, was extubated and taken the PACU in stable condition.  The patient has done well post operatively.  Her chest tube output has decreased and once patient's chest tube was weaned off suction it was removed without difficulty.  Follow up CXR showed a right sided pneumothorax. The patient has had issues with hypokalemia and received supplementation.  She is ambulating without difficulty and tolerating a regular diet.  Dr. Tyrone Sage reviewed her chest x ray this morning and felt she was surgically stable for discharge. Pathology was positive for malignancy and report is pasted below.     Significant Diagnostic Studies:   PET CT Scan: 1. Hypermetabolic right upper lobe mass is most consistent with  primary bronchogenic carcinoma (T2a N0 M0 or stage 1 B disease).  2. Question early/mild cirrhosis.   CHEST 2 VIEW  COMPARISON: 07/14/2013.  FINDINGS:  Right hydro pneumothorax stable. Postsurgical change and anastomosis  staples along the right hilum are also stable. No infiltrate or  pulmonary edema. No left-sided pleural effusion or pneumothorax.  Cardiac silhouette is normal in size. The mediastinum is normal in  contour.  Right internal  jugular central venous line has been removed.  IMPRESSION:  Stable postsurgical changes on the right including a stable hydro  pneumothorax.  Status post removal of the right internal jugular central venous  line. No other change from the previous day's study.  Electronically Signed  By: Amie Portland M.D.  On: 07/15/2013 08:22    PATHOLOGY:  1. Lung, wedge biopsy/resection, Right upper lobe cavitary lesion -  INVASIVE ADENOCARCINOMA, WELL DIFFERENTIATED, SPANNING 4.3 CM. - PERINEURAL INVASION IS IDENTIFIED. - THE SURGICAL RESECTION MARGINS ARE NEGATIVE FOR ADENOCARCINOMA. - SEE ONCOLOGY TABLE BELOW. 2. 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 - ATYPICAL ADENOMATOUS HYPERPLASIA. - THERE IS NO EVIDENCE OF CARCINOMA IN 2 OF 2 LYMPH NODES (0/2). - THE SURGICAL RESECTION MARGINS ARE NEGATIVE FOR CARCINOMA. - SEE ONCOLOGY TABLE BELOW. 3. Lymph node, biopsy, 10 R - THERE IS NO EVIDENCE OF CARCINOMA IN 1 OF 1 LYMPH NODE (0/1). 4. Lymph node, biopsy, 11 R - THERE IS NO EVIDENCE OF CARCINOMA IN 1 OF 1 LYMPH NODE (0/1). 5. Lymph node, biopsy, 12 R - THERE IS NO EVIDENCE OF CARCINOMA IN 1 OF 1 LYMPH NODE (0/1) 6. Lymph node, biopsy, 13 R - THERE IS NO EVIDENCE OF CARCINOMA IN 1 OF 1 LYMPH NODE (0/1). 7. Lymph node, biopsy, 4 R - THERE IS NO EVIDENCE OF CARCINOMA IN 1 OF 1 LYMPH NODE (0/1).  Treatments: surgery:   Bronchoscopy, right video-assisted thoracoscopy, wedge resection, right upper lobe, with completion of right upper  lobectomy, lymph node dissection, and placement of On-Q device.  Disposition: Home  Discharge Medications:   Medication List    STOP taking these medications       ciprofloxacin 250 MG tablet  Commonly known as:  CIPRO     mupirocin ointment 2 %  Commonly known as:  BACTROBAN      TAKE these medications       amLODipine 5 MG tablet  Commonly known as:  NORVASC  Take 5 mg by mouth daily.     chlorthalidone 25 MG tablet  Commonly known as:  HYGROTON  Take 25 mg by mouth daily.     esomeprazole 40 MG capsule  Commonly known as:  NEXIUM  Take 40 mg by mouth daily before breakfast.     oxyCODONE-acetaminophen 5-325 MG per tablet  Commonly known as:  PERCOCET/ROXICET  Take 1-2 tablets by mouth every 4 (four) hours as needed for severe pain.     potassium chloride SA 20 MEQ tablet   Commonly known as:  K-DUR,KLOR-CON  Take 1 tablet (20 mEq total) by mouth 2 (two) times daily.     PRESERVISION AREDS PO  Take 1 tablet by mouth daily.     simvastatin 20 MG tablet  Commonly known as:  ZOCOR  Take 20 mg by mouth every evening.     VITAMIN D-3 PO  Take 1 tablet by mouth 2 (two) times a week.         Future Appointments Provider Department Dept Phone   07/25/2013 9:00 AM Delight Ovens, MD [facility] [phone]     Follow-up Information   Follow up with GERHARDT,EDWARD B, MD On 07/25/2013. (Appointment is at 9:00)    Specialty:  Cardiothoracic Surgery   Contact information:   [address] [place] [address] [place] [address] [phone]       Follow up with [facility] On 07/25/2013. (Please get  CXR at 8:30)    Contact information:   [place]       Signed: Janilah Hojnacki M PA-C 07/15/2013, 11:15 AM

## 2013-07-12 NOTE — Progress Notes (Addendum)
       [address]       [place] [address]             [phone]          4 Days Post-Op Procedure(s) (LRB): VIDEO BRONCHOSCOPY (N/A) VIDEO ASSISTED THORACOSCOPY (VATS)/WEDGE RESECTION (Right)  Subjective: Comfortable, sore, but no other complaints.   Objective: Vital signs in last 24 hours: Patient Vitals for the past 24 hrs:  BP Temp Temp src Pulse Resp SpO2 Weight  07/12/13 0440 147/72 mmHg 97.8 F (36.6 C) Oral 77 18 98 % 134 lb 3.2 oz (60.873 kg)  07/11/13 2022 158/83 mmHg 98.2 F (36.8 C) Oral 76 16 94 % -  07/11/13 1300 149/85 mmHg - - 88 - 97 % -  07/11/13 1119 - 98.6 F (37 C) Oral - - - -  07/11/13 1000 159/81 mmHg - - 77 8 99 % -  07/11/13 0900 146/67 mmHg - - 83 11 98 % -   Current Weight  07/12/13 134 lb 3.2 oz (60.873 kg)     Intake/Output from previous day: 12/04 0701 - 12/05 0700 In: 830 [P.O.:580; IV Piggyback:250] Out: 1660 [Urine:1590; Chest Tube:70]    PHYSICAL EXAM:  Heart: RRR Lungs: Clear Wound: Clean and dry    Lab Results: CBC: Recent Labs  07/10/13 0400 07/11/13 0425  WBC 13.0* 13.1*  HGB 12.2 12.4  HCT 35.5* 36.0  PLT 298 316   BMET:  Recent Labs  07/10/13 0400 07/11/13 0425  NA 134* 132*  K 2.6* 2.8*  CL 93* 91*  CO2 30 29  GLUCOSE 117* 114*  BUN 6 8  CREATININE 0.67 0.72  CALCIUM 9.1 9.6    PT/INR: No results found for this basename: LABPROT, INR,  in the last 72 hours  CXR: FINDINGS:  There is a small right apical pneumothorax, approximately 10% of  lung volume. Postoperative changes are identified in the right hilar  region. Left lung is clear. There is elevation of the right  hemidiaphragm, stable in appearance. Right IJ central line tip  overlies the level of the superior vena cava.  IMPRESSION:  1. Right-sided pneumothorax, approximately 10% of lung volume.  2. These results will be called to the ordering clinician or  representative by the Radiologist Assistant, and  communication  documented in the PACS Dashboard.   Assessment/Plan: S/P Procedure(s) (LRB): VIDEO BRONCHOSCOPY (N/A) VIDEO ASSISTED THORACOSCOPY (VATS)/WEDGE RESECTION (Right) CXR this am shows a small, new right apical ptx.  Pt stable otherwise. Hypokalemia- no new labs this am, K was 2.8 yesterday.  Will recheck this am. Continue IS/pulm toilet.  Hopefully home soon.   LOS: 4 days    COLLINS,GINA H 07/12/2013  Home next 1-2 days Small apical ptx on film today Follow up film in am I have seen and examined Unk Lightning and agree with the above assessment  and plan.  Delight Ovens MD Beeper [phone] Office [phone] 07/12/2013 8:53 AM

## 2013-07-12 NOTE — Progress Notes (Signed)
Ambulated 150 ft using front wheel walker.on room air tolerated well.

## 2013-07-13 ENCOUNTER — Inpatient Hospital Stay (HOSPITAL_COMMUNITY): Payer: Medicare Other

## 2013-07-13 LAB — CBC
HCT: 35.5 % — ABNORMAL LOW (ref 36.0–46.0)
Hemoglobin: 12.3 g/dL (ref 12.0–15.0)
MCH: 33 pg (ref 26.0–34.0)
MCHC: 34.6 g/dL (ref 30.0–36.0)
MCV: 95.2 fL (ref 78.0–100.0)
Platelets: 247 10*3/uL (ref 150–400)
RBC: 3.73 MIL/uL — ABNORMAL LOW (ref 3.87–5.11)
RDW: 12.4 % (ref 11.5–15.5)
WBC: 7.4 10*3/uL (ref 4.0–10.5)

## 2013-07-13 LAB — BASIC METABOLIC PANEL
BUN: 11 mg/dL (ref 6–23)
CO2: 32 mEq/L (ref 19–32)
Calcium: 9.4 mg/dL (ref 8.4–10.5)
Chloride: 91 mEq/L — ABNORMAL LOW (ref 96–112)
Creatinine, Ser: 0.78 mg/dL (ref 0.50–1.10)
GFR calc Af Amer: >90 mL/min (ref >90)
GFR calc non Af Amer: 84 mL/min — ABNORMAL LOW (ref >90)
Glucose, Bld: 97 mg/dL (ref 70–99)
Potassium: 2.8 mEq/L — ABNORMAL LOW (ref 3.5–5.1)
Sodium: 134 mEq/L — ABNORMAL LOW (ref 135–145)

## 2013-07-13 MED ORDER — POTASSIUM CHLORIDE CRYS ER 20 MEQ PO TBCR
20.0000 meq | EXTENDED_RELEASE_TABLET | Freq: Every day | ORAL | Status: DC
  Administered 2013-07-14 – 2013-07-15 (×2): 20 meq via ORAL
  Filled 2013-07-13 (×2): qty 1

## 2013-07-13 MED ORDER — BISACODYL 10 MG RE SUPP: 10.0000 mg | Freq: Every day | RECTAL | Status: DC | PRN

## 2013-07-13 MED ORDER — POTASSIUM CHLORIDE 10 MEQ/50ML IV SOLN
10.0000 meq | INTRAVENOUS | Status: AC
  Administered 2013-07-13 (×3): 10 meq via INTRAVENOUS
  Filled 2013-07-13 (×3): qty 50

## 2013-07-13 MED ORDER — DOCUSATE SODIUM 100 MG PO CAPS: 100.0000 mg | ORAL_CAPSULE | Freq: Two times a day (BID) | ORAL | Status: DC | PRN

## 2013-07-13 MED ORDER — POTASSIUM CHLORIDE CRYS ER 20 MEQ PO TBCR
40.0000 meq | EXTENDED_RELEASE_TABLET | Freq: Once | ORAL | Status: AC
  Administered 2013-07-13: 40 meq via ORAL
  Filled 2013-07-13: qty 2

## 2013-07-13 MED ORDER — MAGNESIUM HYDROXIDE 400 MG/5ML PO SUSP: 30.0000 mL | Freq: Every day | ORAL | Status: DC | PRN

## 2013-07-13 NOTE — Progress Notes (Addendum)
       [address]       [name] [address]             [phone]          5 Days Post-Op Procedure(s) (LRB): VIDEO BRONCHOSCOPY (N/A) VIDEO ASSISTED THORACOSCOPY (VATS)/WEDGE RESECTION (Right)  Subjective: Feels well except constipated.  Breathing stable.   Objective: Vital signs in last 24 hours: Patient Vitals for the past 24 hrs:  BP Temp Temp src Pulse Resp SpO2 Weight  07/13/13 0514 134/78 mmHg 98.2 F (36.8 C) Oral 75 17 96 % 132 lb 7.9 oz (60.1 kg)  07/12/13 2030 160/77 mmHg 99.1 F (37.3 C) Oral 83 18 96 % -  07/12/13 1414 135/71 mmHg 98 F (36.7 C) Oral 91 17 98 % -   Current Weight  07/13/13 132 lb 7.9 oz (60.1 kg)     Intake/Output from previous day: 12/05 0701 - 12/06 0700 In: 720 [P.O.:720] Out: 800 [Urine:800]    PHYSICAL EXAM:  Heart: RRR Lungs: Slightly diminished BS on R Wound: Clean and dry    Lab Results: CBC: Recent Labs  07/11/13 0425 07/13/13 0545  WBC 13.1* 7.4  HGB 12.4 12.3  HCT 36.0 35.5*  PLT 316 247   BMET:  Recent Labs  07/12/13 0927 07/13/13 0545  NA 132* 134*  K 3.0* 2.8*  CL 92* 91*  CO2 31 32  GLUCOSE 95 97  BUN 10 11  CREATININE 0.83 0.78  CALCIUM 9.4 9.4    PT/INR: No results found for this basename: LABPROT, INR,  in the last 72 hours  CXR: FINDINGS:  There remains a hydro pneumothorax on the right. The volume of the  pneumothorax has slightly increased. The pleural line lies between  the posterior aspects of the 4th and 5th posterior ribs. The  meniscus is better demonstrated today. There is no mediastinal  shift. The left lung is well expanded and clear. The  cardiopericardial silhouette is normal in size. The pulmonary  vascularity is not engorged.  IMPRESSION:  The known right-sided hydropneumothorax has increased somewhat in  size and likely of occupies approximately 20- 25% of the lung  volume. There is no mediastinal shift.   Assessment/Plan: S/P Procedure(s)  (LRB): VIDEO BRONCHOSCOPY (N/A) VIDEO ASSISTED THORACOSCOPY (VATS)/WEDGE RESECTION (Right) CXR shows slight increase in R ptx.  Pt asymptomatic and stable otherwise.  Will continue to watch. Hypokalemia- replace K+.  She has been on long term chlorthalidone and her K was low on admission.  Will need to start on routine supplemental K+. Rx LOC today. Hopefully home soon.   LOS: 5 days    COLLINS,GINA H 07/13/2013  Patient seen and examined, agree with above CXR shows a right hydropneumothorax- will follow

## 2013-07-14 ENCOUNTER — Inpatient Hospital Stay (HOSPITAL_COMMUNITY): Payer: Medicare Other

## 2013-07-14 LAB — BASIC METABOLIC PANEL
BUN: 18 mg/dL (ref 6–23)
Calcium: 9.4 mg/dL (ref 8.4–10.5)
Creatinine, Ser: 0.87 mg/dL (ref 0.50–1.10)
GFR calc Af Amer: 78 mL/min — ABNORMAL LOW (ref >90)
GFR calc non Af Amer: 67 mL/min — ABNORMAL LOW (ref >90)
Glucose, Bld: 93 mg/dL (ref 70–99)
Potassium: 3.5 mEq/L (ref 3.5–5.1)
Sodium: 134 mEq/L — ABNORMAL LOW (ref 135–145)

## 2013-07-14 MED ORDER — BENZONATATE 100 MG PO CAPS
200.0000 mg | ORAL_CAPSULE | Freq: Three times a day (TID) | ORAL | Status: DC | PRN
  Filled 2013-07-14: qty 2

## 2013-07-14 NOTE — Progress Notes (Addendum)
       [address]       ,[place] [address]             [phone]          6 Days Post-Op Procedure(s) (LRB): VIDEO BRONCHOSCOPY (N/A) VIDEO ASSISTED THORACOSCOPY (VATS)/WEDGE RESECTION (Right)  Subjective: Doing well.  Breathing stable, +BM yesterday.   Objective: Vital signs in last 24 hours: Patient Vitals for the past 24 hrs:  BP Temp Temp src Pulse Resp SpO2 Weight  07/14/13 0439 151/72 mmHg 98.2 F (36.8 C) Oral 74 18 96 % 134 lb 0.6 oz (60.8 kg)  07/13/13 2132 143/86 mmHg 98.9 F (37.2 C) Oral 76 18 100 % -  07/13/13 1300 131/73 mmHg 98.1 F (36.7 C) Oral 74 18 91 % -   Current Weight  07/14/13 134 lb 0.6 oz (60.8 kg)     Intake/Output from previous day: 12/06 0701 - 12/07 0700 In: 630 [P.O.:480; IV Piggyback:150] Out: 451 [Urine:450; Stool:1]    PHYSICAL EXAM:  Heart: RRR Lungs: Clear Wound: Clean and dry     Lab Results: CBC: Recent Labs  07/13/13 0545  WBC 7.4  HGB 12.3  HCT 35.5*  PLT 247   BMET:  Recent Labs  07/13/13 0545 07/14/13 0452  NA 134* 134*  K 2.8* 3.5  CL 91* 93*  CO2 32 32  GLUCOSE 97 93  BUN 11 18  CREATININE 0.78 0.87  CALCIUM 9.4 9.4    PT/INR: No results found for this basename: LABPROT, INR,  in the last 72 hours  CXR: FINDINGS:  The right-sided hydropneumothorax is again demonstrated and has  slightly increased in size. The pleural line is demonstrated along  the upper aspect of the right 5th rib while on yesterday's study it  was slightly more superiorly positioned between the 4th and 5th  ribs. The meniscus at the base of the hemithorax is unchanged in  appearance. There is no shift of the mediastinum. The left lung  remains mildly hyperinflated and clear. The cardiopericardial  silhouette is normal in size. Mild stable fullness in the right  hilar region is demonstrated. The right internal jugular venous  catheter tip lies in the region of the mid SVC. The heart size and    mediastinal contours are within normal limits. Both lungs are clear.  The visualized skeletal structures are unremarkable.  IMPRESSION:  1. The hydro pneumothorax on the right has slightly increased since  yesterday's study. It occupies approximately 30% of the pleural  space volume. No mediastinal shift is demonstrated.  2. The remainder the examination is within the limits of normal   Assessment/Plan: S/P Procedure(s) (LRB): VIDEO BRONCHOSCOPY (N/A) VIDEO ASSISTED THORACOSCOPY (VATS)/WEDGE RESECTION (Right) Hypokalemia- K+ improved.  Will continue supplemental K+ at home. CXR with slight increase in hydropneumothorax.  Will discuss with MD. Hopefully home soon.  She is otherwise doing well.   LOS: 6 days    COLLINS,GINA H 07/14/2013  Patient seen and examined, agree with above CXR shows the right hydropneumo/ space is slightly bigger- need for this to be stable prior to dc

## 2013-07-14 NOTE — Progress Notes (Signed)
Removed central line to the rt neck applied Vaseline guaze/gauze/hyperfix tape to site. Held pressure for 5 minutes and advised patient to stay in bed for 30 minutes. Will continue to monitor closely. Lajuana Matte, RN

## 2013-07-15 ENCOUNTER — Encounter: Payer: Self-pay | Admitting: *Deleted

## 2013-07-15 ENCOUNTER — Telehealth: Payer: Self-pay | Admitting: Medical Oncology

## 2013-07-15 ENCOUNTER — Inpatient Hospital Stay (HOSPITAL_COMMUNITY): Payer: Medicare Other

## 2013-07-15 ENCOUNTER — Other Ambulatory Visit: Payer: Self-pay

## 2013-07-15 ENCOUNTER — Encounter (HOSPITAL_COMMUNITY): Payer: Self-pay

## 2013-07-15 DIAGNOSIS — E876 Hypokalemia: Secondary | ICD-10-CM

## 2013-07-15 MED ORDER — POTASSIUM CHLORIDE CRYS ER 20 MEQ PO TBCR: 20.0000 meq | EXTENDED_RELEASE_TABLET | Freq: Two times a day (BID) | ORAL | Status: DC

## 2013-07-15 NOTE — Progress Notes (Signed)
Pt and husband given d/c instructions; IV and tele monitor d/c at this time; pt verbalized understanding; will cont. To monitor.

## 2013-07-15 NOTE — Progress Notes (Addendum)
[name]       [place] [address]             [phone]      7 Days Post-Op  Procedure(s) (LRB): VIDEO BRONCHOSCOPY (N/A) VIDEO ASSISTED THORACOSCOPY (VATS)/WEDGE RESECTION (Right) Subjective: Looks and feels well  Objective  Telemetry sinus rhythm   Temp:  [98.1 F (36.7 C)-99 F (37.2 C)] 98.1 F (36.7 C) (12/08 0500) Pulse Rate:  [70-77] 76 (12/08 0500) Resp:  [18] 18 (12/08 0500) BP: (121-154)/(70-77) 154/70 mmHg (12/08 0500) SpO2:  [95 %-99 %] 99 % (12/08 0500) Weight:  [132 lb 11.5 oz (60.2 kg)] 132 lb 11.5 oz (60.2 kg) (12/08 0500)   Intake/Output Summary (Last 24 hours) at 07/15/13 0754 Last data filed at 07/14/13 1700  Gross per 24 hour  Intake    480 ml  Output      0 ml  Net    480 ml       General appearance: alert, cooperative and no distress Heart: regular rate and rhythm Lungs: clear to auscultation bilaterally Abdomen: benign Incision:  Healing well Extrem: no edema  Lab Results:  Recent Labs  07/13/13 0545 07/14/13 0452  NA 134* 134*  K 2.8* 3.5  CL 91* 93*  CO2 32 32  GLUCOSE 97 93  BUN 11 18  CREATININE 0.78 0.87  CALCIUM 9.4 9.4   No results found for this basename: AST, ALT, ALKPHOS, BILITOT, PROT, ALBUMIN,  in the last 72 hours No results found for this basename: LIPASE, AMYLASE,  in the last 72 hours  Recent Labs  07/13/13 0545  WBC 7.4  HGB 12.3  HCT 35.5*  MCV 95.2  PLT 247   No results found for this basename: CKTOTAL, CKMB, TROPONINI,  in the last 72 hours No components found with this basename: POCBNP,  No results found for this basename: DDIMER,  in the last 72 hours No results found for this basename: HGBA1C,  in the last 72 hours No results found for this basename: CHOL, HDL, LDLCALC, TRIG, CHOLHDL,  in the last 72 hours No results found for this basename: TSH, T4TOTAL, FREET3, T3FREE, THYROIDAB,  in the last 72 hours No results found for this basename: VITAMINB12, FOLATE,  FERRITIN, TIBC, IRON, RETICCTPCT,  in the last 72 hours  Medications: Scheduled . amLODipine  5 mg Oral Daily  . bisacodyl  10 mg Oral Daily  . chlorthalidone  25 mg Oral Daily  . enoxaparin (LOVENOX) injection  30 mg Subcutaneous Q24H  . feeding supplement (ENSURE COMPLETE)  237 mL Oral BID BM  . pantoprazole  40 mg Oral Daily  . potassium chloride  20 mEq Oral Daily  . simvastatin  20 mg Oral QPM  . sodium chloride  10-40 mL Intracatheter Q12H     Radiology/Studies:  Dg Chest 2 View  07/14/2013   CLINICAL DATA:  Known right hydro pneumothorax.  EXAM: CHEST  2 VIEW  COMPARISON:  Chest x-ray of July 13, 2013 and July 12, 2013  FINDINGS: The right-sided hydropneumothorax is again demonstrated and has slightly increased in size. The pleural line is demonstrated along the upper aspect of the right 5th rib while on yesterday's study it was slightly more superiorly positioned between the 4th and 5th ribs. The meniscus at the base of the hemithorax is unchanged in appearance. There is no shift of the mediastinum. The left lung remains mildly hyperinflated and clear. The cardiopericardial silhouette is normal in size.  Mild stable fullness in the right hilar region is demonstrated. The right internal jugular venous catheter tip lies in the region of the mid SVC. The heart size and mediastinal contours are within normal limits. Both lungs are clear. The visualized skeletal structures are unremarkable.  IMPRESSION: 1. The hydro pneumothorax on the right has slightly increased since yesterday's study. It occupies approximately 30% of the pleural space volume. No mediastinal shift is demonstrated. 2. The remainder the examination is within the limits of normal.   Electronically Signed   By: David  [place]   On: 07/14/2013 08:13    INR: Will add last result for INR, ABG once components are confirmed Will add last 4 CBG results once components are confirmed  Assessment/Plan: S/P Procedure(s)  (LRB): VIDEO BRONCHOSCOPY (N/A) VIDEO ASSISTED THORACOSCOPY (VATS)/WEDGE RESECTION (Right)  1 CXR is currently pending. Overall looks quite good. Possible d/c later today if XRAY is stable    LOS: 7 days    [name],[name] 12/8/20147:54 AM  patient feels well, Chest xray today is stable Plan home today I have seen and examined [name] and agree with the above assessment  and plan.  Delight Ovens MD Beeper [phone] Office [phone] 07/15/2013 8:55 AM

## 2013-07-15 NOTE — Progress Notes (Signed)
Fax received from [facility] not detected

## 2013-07-15 NOTE — Telephone Encounter (Signed)
Lab results documented

## 2013-07-15 NOTE — Telephone Encounter (Signed)
RX for Potassium Cl 20 meq bid called to pharm

## 2013-07-18 ENCOUNTER — Encounter: Payer: Self-pay | Admitting: Cardiothoracic Surgery

## 2013-07-18 ENCOUNTER — Telehealth: Payer: Self-pay | Admitting: Medical Oncology

## 2013-07-18 ENCOUNTER — Other Ambulatory Visit: Payer: Self-pay | Admitting: *Deleted

## 2013-07-18 DIAGNOSIS — G8918 Other acute postprocedural pain: Secondary | ICD-10-CM

## 2013-07-18 MED ORDER — OXYCODONE-ACETAMINOPHEN 5-325 MG PO TABS: 1.0000 | ORAL_TABLET | ORAL | Status: DC | PRN

## 2013-07-18 NOTE — Telephone Encounter (Signed)
EGFR TKI-not detected 

## 2013-07-25 ENCOUNTER — Ambulatory Visit (INDEPENDENT_AMBULATORY_CARE_PROVIDER_SITE_OTHER): Payer: Self-pay | Admitting: Cardiothoracic Surgery

## 2013-07-25 ENCOUNTER — Ambulatory Visit
Admission: RE | Admit: 2013-07-25 | Discharge: 2013-07-25 | Disposition: A | Discharge status: Home / Self Care | Payer: BC Managed Care – PPO | Source: Ambulatory Visit | Attending: Cardiothoracic Surgery | Admitting: Cardiothoracic Surgery

## 2013-07-25 ENCOUNTER — Telehealth: Payer: Self-pay | Admitting: *Deleted

## 2013-07-25 ENCOUNTER — Encounter: Payer: Self-pay | Admitting: Cardiothoracic Surgery

## 2013-07-25 VITALS — BP 144/88 | HR 74 | Resp 20 | Ht 64.0 in | Wt 132.0 lb

## 2013-07-25 DIAGNOSIS — Z902 Acquired absence of lung [part of]: Secondary | ICD-10-CM

## 2013-07-25 DIAGNOSIS — G8918 Other acute postprocedural pain: Secondary | ICD-10-CM

## 2013-07-25 DIAGNOSIS — Z9889 Other specified postprocedural states: Secondary | ICD-10-CM

## 2013-07-25 DIAGNOSIS — C3490 Malignant neoplasm of unspecified part of unspecified bronchus or lung: Secondary | ICD-10-CM

## 2013-07-25 MED ORDER — OXYCODONE-ACETAMINOPHEN 5-325 MG PO TABS: 1.0000 | ORAL_TABLET | ORAL | Status: DC | PRN

## 2013-07-25 NOTE — Progress Notes (Signed)
[address]       [place] [address]             [phone]                  Illeana Edick [facility] Medical Record #[id number] Date of Birth: 1945-08-13  Burton Apley, MD Lorenda Peck, MD  Chief Complaint:   PostOp Follow Up Visit 07/08/2013   OPERATIVE REPORT  PREOPERATIVE DIAGNOSIS: Right upper lobe lung mass.  POSTOPERATIVE DIAGNOSIS: Carcinoma of the right upper lobe.  PROCEDURE PERFORMED: Bronchoscopy, right video-assisted thoracoscopy,  wedge resection, right upper lobe, with completion of right upper  lobectomy, lymph node dissection, and placement of On-Q device.  SURGEON: Sheliah Plane, MD  PATH: ALK EGFR negative , [name]  mRNA testing still pending  Diagnosis 1. 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 - PERINEURAL INVASION IS IDENTIFIED. - THE SURGICAL RESECTION MARGINS ARE NEGATIVE FOR ADENOCARCINOMA. - SEE ONCOLOGY TABLE BELOW. 2. 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 - ATYPICAL ADENOMATOUS HYPERPLASIA. - THERE IS NO EVIDENCE OF CARCINOMA IN 2 OF 2 LYMPH NODES (0/2). - THE SURGICAL RESECTION MARGINS ARE NEGATIVE FOR CARCINOMA. - SEE ONCOLOGY TABLE BELOW. 3. Lymph node, biopsy, 10 R - THERE IS NO EVIDENCE OF CARCINOMA IN 1 OF 1 LYMPH NODE (0/1). 1 of 4 FINAL for Consuegra, Teriah ANN [phone]) Diagnosis(continued) 4. Lymph node, biopsy, 11 R - THERE IS NO EVIDENCE OF CARCINOMA IN 1 OF 1 LYMPH NODE (0/1). 5. Lymph node, biopsy, 12 R - THERE IS NO EVIDENCE OF CARCINOMA IN 1 OF 1 LYMPH NODE (0/1) 6. Lymph node, biopsy, 13 R - THERE IS NO EVIDENCE OF CARCINOMA IN 1 OF 1 LYMPH NODE (0/1). 7. Lymph node, biopsy, 4 R - THERE IS NO EVIDENCE OF CARCINOMA IN 1 OF 1 LYMPH NODE (0/1). Microscopic Comment 1. LUNG (Part 1) Specimen, including laterality:  Right upper lobe Procedure: Wedge resection followed by completion lobectomy Specimen integrity (intact/disrupted): Intact Tumor site: Right upper lobe Tumor focality: Adenocarcinoma is unifocal Maximum tumor size (cm): 4.3 cm (gross measurement) Histologic type: Adenocarcinoma. Grade: Well differentiated (low grade Margins: Negative for adenocarcinoma Distance to closest margin (cm): 0.8 cm to the nearest stapled margin (gross measurement) Visceral pleura invasion: Not identified Tumor extension: Confined to lung parenchyma Treatment effect (if treated with neoadjuvant therapy): N/A Lymph -Vascular invasion: Not identified Lymph nodes: Number examined - 7; Number N1 nodes positive 0; Number N2 nodes positive 0 TNM code: pT2a, pN0 Ancillary studies: A block will be sent for EGFR and ALK studies and the results reported separately. Comments: The vast majority of the adenocarcinoma has a lepidic growth pattern (so called in-situ adenocarcinoma). However, there are a few small foci of stromal invasion identified. 2. LUNG (Part 2) Specimen, including laterality: Right upper lobe Procedure: Completion lobectomy Specimen integrity (intact/disrupted): Intact Tumor site: Perihilar Tumor focality: Squamous cell carcinoma is unifocal Maximum tumor size (cm): 1.6 cm (gross measurement) Histologic type: Squamous cell carcinoma Grade: Well differentiated (low grade) Margins: Negative for carcinoma Distance to closest margin (cm): 1.5 cm to the bronchial resection margin (gross measurement) Visceral pleura invasion: Not identified. Tumor extension: Confined to lung parenchyma Treatment effect (if treated with neoadjuvant therapy): N/A Lymph -Vascular invasion: Not identified Lymph nodes: Number examined - 7; Number N1 nodes positive 0; Number N2 nodes positive 0  TNM code: pT1a, pN0 Ancillary studies: N/A Comments: The squamous cell carcinoma is predominantly in-situ. However, there is a single  focus suspicious for early invasion.   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 She is making good progress postoperatively. Increasing her physical activity appropriately level. She denies any significant shortness of breath. Has had no fever chills. Her incisions are healing well. She has had some constipation related to pain medication.   History  Smoking status  . Former Smoker  . Types: Cigarettes  Smokeless tobacco  . Never Used    Comment: quit 35 yrs ago       No Known Allergies  Current Outpatient Prescriptions  Medication Sig Dispense Refill  . amLODipine (NORVASC) 5 MG tablet Take 5 mg by mouth daily.      . chlorthalidone (HYGROTON) 25 MG tablet Take 25 mg by mouth daily.      . Cholecalciferol (VITAMIN D-3 PO) Take 1 tablet by mouth 2 (two) times a week.       . esomeprazole (NEXIUM) 40 MG capsule Take 40 mg by mouth daily before breakfast.      . Multiple Vitamins-Minerals (PRESERVISION AREDS PO) Take 1 tablet by mouth daily.       Marland Kitchen oxyCODONE-acetaminophen (PERCOCET/ROXICET) 5-325 MG per tablet Take 1-2 tablets by mouth every 3 (three) hours as needed for severe pain.  50 tablet  0  . potassium chloride SA (K-DUR,KLOR-CON) 20 MEQ tablet Take 1 tablet (20 mEq total) by mouth 2 (two) times daily.  60 tablet  0  . simvastatin (ZOCOR) 20 MG tablet Take 20 mg by mouth every evening.       No current facility-administered medications for this visit.       Physical Exam: BP 144/88  Pulse 74  Resp 20  Ht 5\' 4"  (1.626 m)  Wt 132 lb (59.875 kg)  BMI 22.65 kg/m2  SpO2 96%  General appearance: alert and cooperative Neurologic: intact Heart: regular rate and rhythm, S1, S2 normal, no murmur, click, rub or gallop Lungs: clear to auscultation bilaterally Abdomen: soft,  non-tender; bowel sounds normal; no masses,  no organomegaly Extremities: extremities normal, atraumatic, no cyanosis or edema and Homans sign is negative, no sign of DVT Wound: Right chest port sites/incisions healing well, sutures removed   Diagnostic Studies & Laboratory data:         Recent Radiology Findings: Dg Chest 2 View  07/25/2013   CLINICAL DATA:  History of lung cancer. Status post VATS. Right upper lobectomy.  EXAM: CHEST  2 VIEW  COMPARISON:  07/15/2013.  FINDINGS: The right hydro pneumothorax remains present in the pneumothorax component has decreased in size slightly compared to the prior exam of 07/15/2013. The amount of pleural fluid is small. The cardiopericardial silhouette is unchanged. Mediastinal contours are unchanged. Slight improvement in right infrahilar atelectasis. Left lung is unchanged. S-shaped thoracic scoliosis.  IMPRESSION: Slight interval decrease in the pneumothorax component and of right hydro pneumothorax.   Electronically Signed   By: Andreas Newport M.D.   On: 07/25/2013 08:43      Recent Labs: Lab Results  Component Value Date   WBC 7.4 07/13/2013   HGB 12.3 07/13/2013   HCT 35.5* 07/13/2013   PLT 247 07/13/2013   GLUCOSE 93 07/14/2013  ALT 21 07/10/2013   AST 31 07/10/2013   NA 134* 07/14/2013   K 3.5 07/14/2013   CL 93* 07/14/2013   CREATININE 0.87 07/14/2013   BUN 18 07/14/2013   CO2 32 07/14/2013   INR 0.89 07/03/2013      Assessment / Plan:     Patient stable after right upper lobectomy. She will continue to increase her physical activity appropriately. I discussed with her the genetic testing of her tumor, and  asked her to see Dr. Shirline Frees for input concerning any further treatment. I plan to see her back in one month with a followup chest x-ray   Mahrukh Seguin B 07/25/2013 9:34 AM

## 2013-07-25 NOTE — Telephone Encounter (Signed)
Called and spoke with pt regarding appt with Dr. Arbutus Ped 08/05/13 at 11:15 labs and 11:30 Dr. Arbutus Ped. She verbalized understanding of time and place of appt.

## 2013-08-02 ENCOUNTER — Other Ambulatory Visit: Payer: Self-pay | Admitting: Cardiothoracic Surgery

## 2013-08-02 ENCOUNTER — Other Ambulatory Visit: Payer: Self-pay | Admitting: *Deleted

## 2013-08-02 DIAGNOSIS — G8918 Other acute postprocedural pain: Secondary | ICD-10-CM

## 2013-08-02 MED ORDER — OXYCODONE-ACETAMINOPHEN 5-325 MG PO TABS: 1.0000 | ORAL_TABLET | ORAL | Status: DC | PRN

## 2013-08-03 LAB — FUNGUS CULTURE W SMEAR: Fungal Smear: NONE SEEN

## 2013-08-05 ENCOUNTER — Telehealth: Payer: Self-pay | Admitting: *Deleted

## 2013-08-05 ENCOUNTER — Ambulatory Visit (HOSPITAL_BASED_OUTPATIENT_CLINIC_OR_DEPARTMENT_OTHER): Payer: Medicare Other | Admitting: Internal Medicine

## 2013-08-05 ENCOUNTER — Encounter: Payer: Self-pay | Admitting: Internal Medicine

## 2013-08-05 ENCOUNTER — Other Ambulatory Visit (HOSPITAL_BASED_OUTPATIENT_CLINIC_OR_DEPARTMENT_OTHER): Payer: Medicare Other

## 2013-08-05 ENCOUNTER — Other Ambulatory Visit: Payer: Self-pay | Admitting: Internal Medicine

## 2013-08-05 ENCOUNTER — Ambulatory Visit: Payer: Medicare Other

## 2013-08-05 DIAGNOSIS — C3410 Malignant neoplasm of upper lobe, unspecified bronchus or lung: Secondary | ICD-10-CM

## 2013-08-05 DIAGNOSIS — R42 Dizziness and giddiness: Secondary | ICD-10-CM

## 2013-08-05 DIAGNOSIS — I10 Essential (primary) hypertension: Secondary | ICD-10-CM

## 2013-08-05 DIAGNOSIS — E785 Hyperlipidemia, unspecified: Secondary | ICD-10-CM

## 2013-08-05 LAB — COMPREHENSIVE METABOLIC PANEL (CC13)
ALT: 19 U/L (ref 0–55)
AST: 22 U/L (ref 5–34)
Albumin: 4.1 g/dL (ref 3.5–5.0)
Alkaline Phosphatase: 83 U/L (ref 40–150)
Anion Gap: 10 mEq/L (ref 3–11)
BUN: 15.3 mg/dL (ref 7.0–26.0)
CO2: 28 mEq/L (ref 22–29)
Calcium: 9.9 mg/dL (ref 8.4–10.4)
Chloride: 98 mEq/L (ref 98–109)
Glucose: 104 mg/dl (ref 70–140)
Potassium: 4 mEq/L (ref 3.5–5.1)
Sodium: 137 mEq/L (ref 136–145)
Total Protein: 7.1 g/dL (ref 6.4–8.3)

## 2013-08-05 LAB — CBC WITH DIFFERENTIAL/PLATELET
BASO%: 0.8 % (ref 0.0–2.0)
EOS%: 2.9 % (ref 0.0–7.0)
Eosinophils Absolute: 0.2 10*3/uL (ref 0.0–0.5)
MCH: 32.8 pg (ref 25.1–34.0)
MCHC: 34.2 g/dL (ref 31.5–36.0)
MCV: 95.8 fL (ref 79.5–101.0)
MONO%: 6.5 % (ref 0.0–14.0)
Platelets: 279 10*3/uL (ref 145–400)
RBC: 4.01 10*6/uL (ref 3.70–5.45)
RDW: 12.7 % (ref 11.2–14.5)
lymph#: 1.8 10*3/uL (ref 0.9–3.3)

## 2013-08-05 MED ORDER — FOLIC ACID 1 MG PO TABS: 1.0000 mg | ORAL_TABLET | Freq: Every day | ORAL | Status: DC

## 2013-08-05 MED ORDER — CYANOCOBALAMIN 1000 MCG/ML IJ SOLN: 1000.0000 ug | Freq: Once | INTRAMUSCULAR | Status: DC

## 2013-08-05 MED ORDER — PROCHLORPERAZINE MALEATE 10 MG PO TABS: 10.0000 mg | ORAL_TABLET | Freq: Four times a day (QID) | ORAL | Status: DC | PRN

## 2013-08-05 MED ORDER — DEXAMETHASONE 4 MG PO TABS: ORAL_TABLET | ORAL | Status: DC

## 2013-08-05 NOTE — Patient Instructions (Signed)
We discussed the treatment options including observation versus adjuvant chemotherapy.  You are interested in proceeding with chemotherapy.  Your treatment regimen will be in the form of cisplatin and Alimta. First cycle on 08/21/2013. Followup visit in 3 weeks

## 2013-08-05 NOTE — Telephone Encounter (Signed)
Per research RN I  Have moved appts

## 2013-08-05 NOTE — Telephone Encounter (Signed)
Late entry entered on wrong pt

## 2013-08-05 NOTE — Telephone Encounter (Signed)
appts made and printed. Pt is aware that tx will be added. i emailed MW to add the tx...td 

## 2013-08-05 NOTE — Progress Notes (Signed)
[facility] [phone]   [phone]  CONSULT NOTE  REFERRING PHYSICIAN: Dr. Sheliah Plane  REASON FOR CONSULTATION:  67 years old white female recently diagnosed with lung cancer  HPI [name] is a 68 y.o. female was past medical history significant for dyslipidemia, hypertension, GERD, colon polyps, diverticulitis, arthritis as well as recently diagnosed lung cancer. The patient mentions that she was seen in early November of 2014 by her primary care physician for annual physical examination and she complained to him off chest pain with radiation to the back 6-7 months duration. He ordered chest x-ray which was performed on 06/08/2013 and it showed Cavitary mass in the right upper lobe worrisome for malignancy. The patient also had dizziness and multiple films and CT of the head on 06/13/2013 showed no bleed or other acute intracranial process. CT scan of the chest was performed on 06/19/2013 and it showed findings highly suspicious for primary bronchogenic carcinoma of the right upper lobe. There was soft tissue component of the mass measured 2.6 x 2.5 x 3.5 CM. there was also a new faint pulmonary nodules in the right upper lobe measured 3 and 4 mm respectively and central superior endplate mild compression deformity of the T9 vertebral body. The patient was referred to Dr. Tyrone Sage and a PET scan on 06/28/2014 showed hypermetabolic right upper lobe mass is most consistent with primary bronchogenic carcinoma (T2a N0 M0 or stage 1 B disease). On 07/08/2013 the patient underwent Bronchoscopy, right video-assisted thoracoscopy, wedge resection, right upper lobe, with completion of right upper lobectomy, lymph node dissection under the care of Dr. Tyrone Sage. 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 The surgical resection margin and the dissected lymph nodes were negative for malignancy. 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 The tissue blocks were sent for molecular studies and it showed negative EGFR mutation and negative ALK gene translocation. Dr. Tyrone Sage kindly referred the patient to me today for further evaluation and recommendation regarding treatment of her condition. When seen today the patient is feeling fine with no specific complaints except for mild soreness in the right side of the chest from the recent surgery with some tightness in the right side of the chest. She denied having any significant shortness of breath, cough or hemoptysis. She lost around 10 pounds in the last 3 months. The patient denied having any significant nausea or vomiting, no fever or chills. Family history is significant for a father who died from heart disease mother died from old age and half brother with stomach cancer. The patient is married and was accompanied by her husband, Ron. She has 2 children. She used to work in [employer] and currently retired. She has a history of smoking more than one pack per day for around 18 years but quit 30 years ago. She also drinks 4-5 beers every day with no history of drug abuse.  HPI  Past Medical History  Diagnosis Date  . Hyperlipidemia     takes Zocor daily  . Cavitating mass in right upper lung lobe   . Lung nodules     right upper lobe  . GERD (gastroesophageal reflux disease)     takes Nexium daily  . Hypertension     takes Amlodipine daily and Chlorthalidone as well  .  Dizziness     occasionally   . Arthritis     back   . History of colon polyps   . Diverticulosis   . Family history of kidney infection     pt history-saw Dr.Wrenn  . Nocturia   . Lung cancer, Right upper lobe     Past Surgical History  Procedure Laterality  Date  . Cardiovascular stress test  06/29/2009    EF 79%, NO ISCHEMIA  . Tracheostomy      at age 21 or 5 d/t polyps on vocal cord  . Abdominal hysterectomy      at age 46;partial   . Colonoscopy    . Video bronchoscopy N/A 07/08/2013    Procedure: VIDEO BRONCHOSCOPY;  Surgeon: Delight Ovens, MD;  Location: [facility] OR;  Service: Thoracic;  Laterality: N/A;  . Video assisted thoracoscopy (vats)/wedge resection Right 07/08/2013    Procedure: VIDEO ASSISTED THORACOSCOPY (VATS)/WEDGE RESECTION;  Surgeon: Delight Ovens, MD;  Location: [facility];  Service: Thoracic;  Laterality: Right;    Family History  Problem Relation Age of Onset  . Heart attack Father   . Cancer Brother     stomach    Social History History  Substance Use Topics  . Smoking status: Former Smoker    Types: Cigarettes    Quit date: 08/08/1978  . Smokeless tobacco: Never Used     Comment: quit 35 yrs ago  . Alcohol Use: Yes     Comment: several beers a day and shots of crown    No Known Allergies  Current Outpatient Prescriptions  Medication Sig Dispense Refill  . amLODipine (NORVASC) 5 MG tablet Take 5 mg by mouth daily.      . chlorthalidone (HYGROTON) 25 MG tablet Take 25 mg by mouth daily.      . Cholecalciferol (VITAMIN D-3 PO) Take 1 tablet by mouth 2 (two) times a week.       . esomeprazole (NEXIUM) 40 MG capsule Take 40 mg by mouth daily before breakfast.      . Multiple Vitamins-Minerals (PRESERVISION AREDS PO) Take 1 tablet by mouth daily.       Marland Kitchen oxyCODONE-acetaminophen (PERCOCET/ROXICET) 5-325 MG per tablet Take 1-2 tablets by mouth every 3 (three) hours as needed for severe pain.  30 tablet  0  . potassium chloride SA (K-DUR,KLOR-CON) 20 MEQ tablet Take 1 tablet (20 mEq total) by mouth 2 (two) times daily.  60 tablet  0  . simvastatin (ZOCOR) 20 MG tablet Take 20 mg by mouth every evening.      Marland Kitchen dexamethasone (DECADRON) 4 MG tablet 4 Milligram by mouth twice a day the day before, day of and day after  the chemotherapy every 3 weeks.  30 tablet  0  . folic acid (FOLVITE) 1 MG tablet Take 1 tablet (1 mg total) by mouth daily.  30 tablet  2  . prochlorperazine (COMPAZINE) 10 MG tablet Take 1 tablet (10 mg total) by mouth every 6 (six) hours as needed for nausea or vomiting.  60 tablet  0   Current Facility-Administered Medications  Medication Dose Route Frequency Provider Last Rate Last Dose  . [START ON 08/14/2013] cyanocobalamin ((VITAMIN B-12)) injection 1,000 mcg  1,000 mcg Intramuscular Once Si Gaul, MD        Review of Systems  Constitutional: positive for weight loss Eyes: negative Ears, nose, mouth, throat, and face: negative Respiratory: positive for pleurisy/chest pain Cardiovascular: negative Gastrointestinal: negative Genitourinary:negative Integument/breast: negative Hematologic/lymphatic: negative Musculoskeletal:negative Neurological:  negative Behavioral/Psych: negative Endocrine: negative Allergic/Immunologic: negative  Physical Exam  ZOX:WRUEA, healthy, no distress, well nourished and well developed SKIN: skin color, texture, turgor are normal, no rashes or significant lesions HEAD: Normocephalic, No masses, lesions, tenderness or abnormalities EYES: normal, PERRLA EARS: External ears normal, Canals clear OROPHARYNX:no exudate, no erythema and lips, buccal mucosa, and tongue normal  NECK: supple, no adenopathy, no JVD LYMPH:  no palpable lymphadenopathy, no hepatosplenomegaly BREAST:not examined LUNGS: clear to auscultation , and palpation HEART: regular rate & rhythm, no murmurs and no gallops ABDOMEN:abdomen soft, non-tender, normal bowel sounds and no masses or organomegaly BACK: Back symmetric, no curvature., No CVA tenderness EXTREMITIES:no joint deformities, effusion, or inflammation, no edema, no skin discoloration, no clubbing  NEURO: alert & oriented x 3 with fluent speech, no focal motor/sensory deficits  PERFORMANCE STATUS: ECOG  1  LABORATORY DATA: Lab Results  Component Value Date   WBC 7.6 08/05/2013   HGB 13.1 08/05/2013   HCT 38.4 08/05/2013   MCV 95.8 08/05/2013   PLT 279 08/05/2013      Chemistry      Component Value Date/Time   NA 137 08/05/2013 1122   NA 134* 07/14/2013 0452   K 4.0 08/05/2013 1122   K 3.5 07/14/2013 0452   CL 93* 07/14/2013 0452   CO2 28 08/05/2013 1122   CO2 32 07/14/2013 0452   BUN 15.3 08/05/2013 1122   BUN 18 07/14/2013 0452   CREATININE 1.0 08/05/2013 1122   CREATININE 0.87 07/14/2013 0452      Component Value Date/Time   CALCIUM 9.9 08/05/2013 1122   CALCIUM 9.4 07/14/2013 0452   ALKPHOS 83 08/05/2013 1122   ALKPHOS 89 07/10/2013 0400   AST 22 08/05/2013 1122   AST 31 07/10/2013 0400   ALT 19 08/05/2013 1122   ALT 21 07/10/2013 0400   BILITOT 0.45 08/05/2013 1122   BILITOT 0.3 07/10/2013 0400       RADIOGRAPHIC STUDIES: Dg Chest 2 View  07/25/2013   CLINICAL DATA:  History of lung cancer. Status post VATS. Right upper lobectomy.  EXAM: CHEST  2 VIEW  COMPARISON:  07/15/2013.  FINDINGS: The right hydro pneumothorax remains present in the pneumothorax component has decreased in size slightly compared to the prior exam of 07/15/2013. The amount of pleural fluid is small. The cardiopericardial silhouette is unchanged. Mediastinal contours are unchanged. Slight improvement in right infrahilar atelectasis. Left lung is unchanged. S-shaped thoracic scoliosis.  IMPRESSION: Slight interval decrease in the pneumothorax component and of right hydro pneumothorax.   Electronically Signed   By: Andreas Newport M.D.   On: 07/25/2013 08:43   Dg Chest 2 View  07/15/2013   CLINICAL DATA:  Status post right lung surgery.  EXAM: CHEST  2 VIEW  COMPARISON:  07/14/2013.  FINDINGS: Right hydro pneumothorax stable. Postsurgical change and anastomosis staples along the right hilum are also stable. No infiltrate or pulmonary edema. No left-sided pleural effusion or pneumothorax.  Cardiac silhouette  is normal in size. The mediastinum is normal in contour.  Right internal jugular central venous line has been removed.  IMPRESSION: Stable postsurgical changes on the right including a stable hydro pneumothorax.  Status post removal of the right internal jugular central venous line. No other change from the previous day's study.   Electronically Signed   By: Amie Portland M.D.   On: 07/15/2013 08:22   Dg Chest 2 View  07/14/2013   CLINICAL DATA:  Known right hydro pneumothorax.  EXAM: CHEST  2 VIEW  COMPARISON:  Chest x-ray of July 13, 2013 and July 12, 2013  FINDINGS: The right-sided hydropneumothorax is again demonstrated and has slightly increased in size. The pleural line is demonstrated along the upper aspect of the right 5th rib while on yesterday's study it was slightly more superiorly positioned between the 4th and 5th ribs. The meniscus at the base of the hemithorax is unchanged in appearance. There is no shift of the mediastinum. The left lung remains mildly hyperinflated and clear. The cardiopericardial silhouette is normal in size. Mild stable fullness in the right hilar region is demonstrated. The right internal jugular venous catheter tip lies in the region of the mid SVC. The heart size and mediastinal contours are within normal limits. Both lungs are clear. The visualized skeletal structures are unremarkable.  IMPRESSION: 1. The hydro pneumothorax on the right has slightly increased since yesterday's study. It occupies approximately 30% of the pleural space volume. No mediastinal shift is demonstrated. 2. The remainder the examination is within the limits of normal.   Electronically Signed   By: David  [place]   On: 07/14/2013 08:13   Dg Chest 2 View  07/13/2013   CLINICAL DATA:  History of lung malignancy.  EXAM: CHEST  2 VIEW  COMPARISON:  December 5th, 2014.  FINDINGS: There remains a hydro pneumothorax on the right. The volume of the pneumothorax has slightly increased. The pleural  line lies between the posterior aspects of the 4th and 5th posterior ribs. The meniscus is better demonstrated today. There is no mediastinal shift. The left lung is well expanded and clear. The cardiopericardial silhouette is normal in size. The pulmonary vascularity is not engorged.  IMPRESSION: The known right-sided hydropneumothorax has increased somewhat in size and likely of occupies approximately 20- 25% of the lung volume. There is no mediastinal shift.   Electronically Signed   By: David  [place]   On: 07/13/2013 07:54   Dg Chest 2 View  07/12/2013   CLINICAL DATA:  Postop from video bronchoscopy 4 days ago. The patient feels improvement. Soreness on the right side in shoulder.  EXAM: CHEST  2 VIEW  COMPARISON:  07/11/2013  FINDINGS: There is a small right apical pneumothorax, approximately 10% of lung volume. Postoperative changes are identified in the right hilar region. Left lung is clear. There is elevation of the right hemidiaphragm, stable in appearance. Right IJ central line tip overlies the level of the superior vena cava.  IMPRESSION: 1. Right-sided pneumothorax, approximately 10% of lung volume. 2. These results will be called to the ordering clinician or representative by the Radiologist Assistant, and communication documented in the PACS Dashboard.   Electronically Signed   By: Rosalie Gums M.D.   On: 07/12/2013 08:33   Dg Chest 2 View  07/08/2013   CLINICAL DATA:  Preoperative for right lung surgery of lung mass.  EXAM: CHEST  2 VIEW  COMPARISON:  PET-CT July 01, 2013  FINDINGS: The heart size and mediastinal contours are within normal limits. There is a 3 x 3.2 cm mass in the right upper lobe. There is no pulmonary edema or pleural effusion. There are degenerative joint changes of the spine. There is scoliosis of spine.  IMPRESSION: Mass in the right upper lobe correlating to the PET-CT findings.   Electronically Signed   By: Sherian Rein M.D.   On: 07/08/2013 07:23   Dg Chest Port  1 View  07/11/2013   CLINICAL DATA:  Chest tube removal on the right  EXAM: PORTABLE CHEST - 1 VIEW  COMPARISON:  0549 hr  FINDINGS: Central line is in unchanged position. Right chest tube has been removed with no evidence of pneumothorax. Postsurgical change right hilum stable. Rib at stable elevated right diaphragm. Heart size is normal. Left lung remains clear.  IMPRESSION: Right chest tube has been removed with no evidence of pneumothorax.   Electronically Signed   By: Esperanza Heir M.D.   On: 07/11/2013 12:07   Dg Chest Port 1 View  07/11/2013   CLINICAL DATA:  History of thoracoscopy and wedge resection. On the right.  EXAM: PORTABLE CHEST - 1 VIEW  COMPARISON:  Chest x-ray dated July 10, 2013.  FINDINGS: The right lung is slightly less well inflated than the left but this is stable. One of the 2 chest tubes has been withdrawn. The remaining chest tube tip lies medially in the upper hemi thorax between collection at the level of the 34th rib interspace. The right internal jugular venous catheter tip lies in the region the midportion of the SVC and is stable. The cardiopericardial silhouette is mildly enlarged though stable. The pulmonary vascularity is not engorged. No significant pleural effusion is demonstrated.  IMPRESSION: There has been interval withdrawal of 1 of the 2 chest tubes. The right lung is rib reasonably well inflated allowing for the recent procedure. There is no evidence of a pneumothorax or pleural effusion on the right. The remainder of the examination is stable.   Electronically Signed   By: David  [place]   On: 07/11/2013 08:01   Dg Chest Port 1 View  07/10/2013   CLINICAL DATA:  Prior chest surgery.  Chest tubes.  EXAM: PORTABLE CHEST - 1 VIEW  COMPARISON:  07/09/2013.  FINDINGS: Right IJ line and 2 right chest tubes in stable position. No pneumothorax. No pleural effusion. Right perihilar postoperative changes are noted with sutures present. This finding is stable. Heart size  is stable. Pulmonary vascularity is normal. No acute bony abnormality.  IMPRESSION: 1. Stable chest, no change from 07/09/2013. Right IJ line and 2 right chest tubes in stable position. No pneumothorax.  2. Right perihilar prominence consistent with postoperative change. Surgical sutures are noted in this region.   Electronically Signed   By: Maisie Fus  Register   On: 07/10/2013 07:25   Dg Chest Portable 1 View  07/09/2013   CLINICAL DATA:  Lung mass, postoperative day 1 post VATS  EXAM: PORTABLE CHEST - 1 VIEW  COMPARISON:  Portable exam 0540 hr compared to 07/08/2013  FINDINGS: Tip of right jugular size wrist catheter projects over SVC.  Pair of right thoracostomy tubes again identified.  Upper normal heart size.  Stable mediastinal contours and pulmonary vascularity.  Atelectasis upper right lung.  No pulmonary infiltrate, pleural effusion, or pneumothorax.  IMPRESSION: Stable postoperative changes.   Electronically Signed   By: Ulyses Southward M.D.   On: 07/09/2013 08:46   Dg Chest Portable 1 View  07/08/2013   CLINICAL DATA:  Status post right chest surgery  EXAM: PORTABLE CHEST - 1 VIEW  COMPARISON:  July 08, 2013  FINDINGS: Patient is status post right chest surgery with 2 right-sided chest tubes in place with distal tips in the right apex. A right jugular central line is identified with distal tip in superior vena cava. Consolidation of the right perihilar region with surgical sutures are noted. There is mild opacity of the left lung base. No definite pleural line is identified to suggest pneumothorax. The heart size is  normal.  IMPRESSION: Status post right chest surgery. There are 2 right-sided chest tubes with distal tips in the right apex. A right jugular central venous line is identified with distal tip in superior vena cava. There is no pneumothorax.   Electronically Signed   By: Sherian Rein M.D.   On: 07/08/2013 13:04    ASSESSMENT: This is a very pleasant 67 years old white female recently  diagnosed with synchronous stage IB (T2a, N0, M0) non-small cell lung cancer, adenocarcinoma of the right upper lobe and a stage IA (T1a, N0,M0) non-small cell lung cancer, squamous cell carcinoma of the right upper lobe. The first tumor measured 4.3 CM in size. The molecular biomarkers showed negative EGFR mutation and negative ALK gene translocation.   PLAN: I have a lengthy discussion with the patient and her husband today about her current disease stage, prognosis and treatment options. I explained to the patient and her husband that the overall 5 year survival for patients with stage IB non-small cell lung cancer is around 60%. I also explained to the patient that adjuvant chemotherapy with platinum-based therapy may increase the 5 years survival by 5-10%. Her tumor size is more than 4.0 CM and the patient may benefit from adjuvant chemotherapy.  She was also given the option of observation and close monitoring. The patient is interested in proceeding with the chemotherapy. She will be treated with 4 cycles of systemic chemotherapy with cisplatin 75 mg/M2 and Alimta 500 mg/M2 every 3 weeks.  I discussed with the patient the adverse effect of this treatment including but not limited to alopecia, myelosuppression, nausea and vomiting, peripheral neuropathy, liver or renal dysfunction. She is expected to start the first cycle of this treatment on 08/20/2013. I will arrange for the patient to have a chemotherapy education class as well as vitamin B12 injection on 08/14/2013. I will call her pharmacy with prescription for Compazine 10 mg by mouth every 6 hours as needed for nausea, folic acid 1 mg by mouth daily in addition to Decadron 4 mg by mouth twice a day the day before, day of and day after the chemotherapy. The patient would come back for followup visit in 3 weeks for evaluation and management any adverse effect of her treatment. She was advised to call immediately if she has any concerning  symptoms in the interval. The patient voices understanding of current disease status and treatment options and is in agreement with the current care plan.  All questions were answered. The patient knows to call the clinic with any problems, questions or concerns. We can certainly see the patient much sooner if necessary.  Thank you so much for allowing me to participate in the care of Murlean Seelye Leyda. I will continue to follow up the patient with you and assist in her care.  I spent 55 minutes counseling the patient face to face. The total time spent in the appointment was 70 minutes.  Lanny Lipkin K. 08/05/2013, 12:54 PM

## 2013-08-05 NOTE — Telephone Encounter (Signed)
Per staff message and POF I have scheduled appts.  JMW  

## 2013-08-05 NOTE — Progress Notes (Signed)
Checked in new patient with no financial issues and she has her appt crd.

## 2013-08-07 ENCOUNTER — Other Ambulatory Visit: Payer: Medicare Other

## 2013-08-14 ENCOUNTER — Other Ambulatory Visit: Payer: Self-pay | Admitting: *Deleted

## 2013-08-14 ENCOUNTER — Ambulatory Visit (HOSPITAL_BASED_OUTPATIENT_CLINIC_OR_DEPARTMENT_OTHER): Payer: Medicare Other

## 2013-08-14 ENCOUNTER — Other Ambulatory Visit: Payer: Medicare Other

## 2013-08-14 ENCOUNTER — Encounter: Payer: Self-pay | Admitting: *Deleted

## 2013-08-14 DIAGNOSIS — C3410 Malignant neoplasm of upper lobe, unspecified bronchus or lung: Secondary | ICD-10-CM

## 2013-08-14 MED ORDER — CYANOCOBALAMIN 1000 MCG/ML IJ SOLN
1000.0000 ug | Freq: Once | INTRAMUSCULAR | Status: AC
  Administered 2013-08-14: 1000 ug via INTRAMUSCULAR

## 2013-08-19 ENCOUNTER — Other Ambulatory Visit: Payer: Self-pay

## 2013-08-19 DIAGNOSIS — G8918 Other acute postprocedural pain: Secondary | ICD-10-CM

## 2013-08-19 MED ORDER — OXYCODONE-ACETAMINOPHEN 5-325 MG PO TABS: 1.0000 | ORAL_TABLET | Freq: Three times a day (TID) | ORAL | Status: DC | PRN

## 2013-08-19 NOTE — Telephone Encounter (Signed)
RX refill for Percocet 3/325 mg printed out and pt's husband will pickup after 1600 today.

## 2013-08-20 ENCOUNTER — Other Ambulatory Visit: Payer: Self-pay | Admitting: Internal Medicine

## 2013-08-20 ENCOUNTER — Telehealth: Payer: Self-pay | Admitting: *Deleted

## 2013-08-20 LAB — AFB CULTURE WITH SMEAR (NOT AT ARMC): Acid Fast Smear: NONE SEEN

## 2013-08-20 NOTE — Telephone Encounter (Signed)
Pt called asking if she needed to keep taking potassium supplement to get chemotherapy.  Informed her it appears Dr Servando Snare prescribed her K tablets.  Explained that typically while on chemotherapy if she is not already taking potassium Dr Vista Mink will call in a 7-10 day supply if the potassium level drops.  Pt verbalized understanding.  SLJ

## 2013-08-21 ENCOUNTER — Other Ambulatory Visit (HOSPITAL_BASED_OUTPATIENT_CLINIC_OR_DEPARTMENT_OTHER): Payer: Medicare Other

## 2013-08-21 ENCOUNTER — Encounter (INDEPENDENT_AMBULATORY_CARE_PROVIDER_SITE_OTHER): Payer: Self-pay

## 2013-08-21 ENCOUNTER — Ambulatory Visit (HOSPITAL_BASED_OUTPATIENT_CLINIC_OR_DEPARTMENT_OTHER): Payer: Medicare Other

## 2013-08-21 DIAGNOSIS — C459 Mesothelioma, unspecified: Secondary | ICD-10-CM

## 2013-08-21 DIAGNOSIS — Z5111 Encounter for antineoplastic chemotherapy: Secondary | ICD-10-CM

## 2013-08-21 DIAGNOSIS — J984 Other disorders of lung: Secondary | ICD-10-CM | POA: Insufficient documentation

## 2013-08-21 DIAGNOSIS — C3410 Malignant neoplasm of upper lobe, unspecified bronchus or lung: Secondary | ICD-10-CM

## 2013-08-21 LAB — COMPREHENSIVE METABOLIC PANEL (CC13)
ALBUMIN: 4.2 g/dL (ref 3.5–5.0)
ALT: 14 U/L (ref 0–55)
AST: 17 U/L (ref 5–34)
Alkaline Phosphatase: 94 U/L (ref 40–150)
Anion Gap: 13 mEq/L — ABNORMAL HIGH (ref 3–11)
BUN: 16.3 mg/dL (ref 7.0–26.0)
CALCIUM: 10.1 mg/dL (ref 8.4–10.4)
CHLORIDE: 97 meq/L — AB (ref 98–109)
CO2: 26 mEq/L (ref 22–29)
Creatinine: 0.9 mg/dL (ref 0.6–1.1)
Glucose: 144 mg/dl — ABNORMAL HIGH (ref 70–140)
POTASSIUM: 4 meq/L (ref 3.5–5.1)
SODIUM: 136 meq/L (ref 136–145)
TOTAL PROTEIN: 7 g/dL (ref 6.4–8.3)
Total Bilirubin: 0.38 mg/dL (ref 0.20–1.20)

## 2013-08-21 LAB — MAGNESIUM (CC13): Magnesium: 2 mg/dl (ref 1.5–2.5)

## 2013-08-21 LAB — CBC WITH DIFFERENTIAL/PLATELET
BASO%: 0 % (ref 0.0–2.0)
Basophils Absolute: 0 10*3/uL (ref 0.0–0.1)
EOS%: 0 % (ref 0.0–7.0)
Eosinophils Absolute: 0 10*3/uL (ref 0.0–0.5)
HEMATOCRIT: 37.2 % (ref 34.8–46.6)
HGB: 13 g/dL (ref 11.6–15.9)
LYMPH#: 1.2 10*3/uL (ref 0.9–3.3)
LYMPH%: 11.7 % — ABNORMAL LOW (ref 14.0–49.7)
MCH: 32.3 pg (ref 25.1–34.0)
MCHC: 34.9 g/dL (ref 31.5–36.0)
MCV: 92.5 fL (ref 79.5–101.0)
MONO#: 0.4 10*3/uL (ref 0.1–0.9)
MONO%: 4.1 % (ref 0.0–14.0)
NEUT#: 8.5 10*3/uL — ABNORMAL HIGH (ref 1.5–6.5)
NEUT%: 84.2 % — AB (ref 38.4–76.8)
Platelets: 258 10*3/uL (ref 145–400)
RBC: 4.02 10*6/uL (ref 3.70–5.45)
RDW: 12.9 % (ref 11.2–14.5)
WBC: 10 10*3/uL (ref 3.9–10.3)
nRBC: 0 % (ref 0–0)

## 2013-08-21 MED ORDER — PALONOSETRON HCL INJECTION 0.25 MG/5ML
0.2500 mg | Freq: Once | INTRAVENOUS | Status: AC
  Administered 2013-08-21: 0.25 mg via INTRAVENOUS

## 2013-08-21 MED ORDER — DEXAMETHASONE SODIUM PHOSPHATE 20 MG/5ML IJ SOLN
INTRAMUSCULAR | Status: AC
  Filled 2013-08-21: qty 5

## 2013-08-21 MED ORDER — SODIUM CHLORIDE 0.9 % IV SOLN
500.0000 mg/m2 | Freq: Once | INTRAVENOUS | Status: AC
  Administered 2013-08-21: 825 mg via INTRAVENOUS
  Filled 2013-08-21: qty 33

## 2013-08-21 MED ORDER — DEXAMETHASONE SODIUM PHOSPHATE 20 MG/5ML IJ SOLN
12.0000 mg | Freq: Once | INTRAMUSCULAR | Status: AC
  Administered 2013-08-21: 12 mg via INTRAVENOUS

## 2013-08-21 MED ORDER — SODIUM CHLORIDE 0.9 % IV SOLN
Freq: Once | INTRAVENOUS | Status: AC
  Administered 2013-08-21: 12:00:00 via INTRAVENOUS

## 2013-08-21 MED ORDER — FOSAPREPITANT DIMEGLUMINE INJECTION 150 MG
150.0000 mg | Freq: Once | INTRAVENOUS | Status: AC
  Administered 2013-08-21: 150 mg via INTRAVENOUS
  Filled 2013-08-21: qty 5

## 2013-08-21 MED ORDER — POTASSIUM CHLORIDE 2 MEQ/ML IV SOLN
Freq: Once | INTRAVENOUS | Status: AC
  Administered 2013-08-21: 10:00:00 via INTRAVENOUS
  Filled 2013-08-21: qty 10

## 2013-08-21 MED ORDER — PALONOSETRON HCL INJECTION 0.25 MG/5ML
INTRAVENOUS | Status: AC
  Filled 2013-08-21: qty 5

## 2013-08-21 MED ORDER — SODIUM CHLORIDE 0.9 % IV SOLN
75.0000 mg/m2 | Freq: Once | INTRAVENOUS | Status: AC
  Administered 2013-08-21: 120 mg via INTRAVENOUS
  Filled 2013-08-21: qty 120

## 2013-08-21 NOTE — Patient Instructions (Signed)
[facility] Discharge Instructions for Patients Receiving Chemotherapy  Today you received the following chemotherapy agents: Cisplatin, Alimta  To help prevent nausea and vomiting after your treatment, we encourage you to take your nausea medication as prescribed.    If you develop nausea and vomiting that is not controlled by your nausea medication, call the clinic.   BELOW ARE SYMPTOMS THAT SHOULD BE REPORTED IMMEDIATELY:  *FEVER GREATER THAN 100.5 F  *CHILLS WITH OR WITHOUT FEVER  NAUSEA AND VOMITING THAT IS NOT CONTROLLED WITH YOUR NAUSEA MEDICATION  *UNUSUAL SHORTNESS OF BREATH  *UNUSUAL BRUISING OR BLEEDING  TENDERNESS IN MOUTH AND THROAT WITH OR WITHOUT PRESENCE OF ULCERS  *URINARY PROBLEMS  *BOWEL PROBLEMS  UNUSUAL RASH Items with * indicate a potential emergency and should be followed up as soon as possible.  Feel free to call the clinic you have any questions or concerns. The clinic phone number is [phone].   Cisplatin injection What is this medicine? CISPLATIN (SIS pla tin) is a chemotherapy drug. It targets fast dividing cells, like cancer cells, and causes these cells to die. This medicine is used to treat many types of cancer like bladder, ovarian, and testicular cancers. This medicine may be used for other purposes; ask your health care provider or pharmacist if you have questions. COMMON BRAND NAME(S): Platinol -AQ, Platinol What should I tell my health care provider before I take this medicine? They need to know if you have any of these conditions: -blood disorders -hearing problems -kidney disease -recent or ongoing radiation therapy -an unusual or allergic reaction to cisplatin, carboplatin, other chemotherapy, other medicines, foods, dyes, or preservatives -pregnant or trying to get pregnant -breast-feeding How should I use this medicine? This drug is given as an infusion into a vein. It is administered in a hospital or clinic  by a specially trained health care professional. Talk to your pediatrician regarding the use of this medicine in children. Special care may be needed. Overdosage: If you think you have taken too much of this medicine contact a poison control center or emergency room at once. NOTE: This medicine is only for you. Do not share this medicine with others. What if I miss a dose? It is important not to miss a dose. Call your doctor or health care professional if you are unable to keep an appointment. What may interact with this medicine? -dofetilide -foscarnet -medicines for seizures -medicines to increase blood counts like filgrastim, pegfilgrastim, sargramostim -probenecid -pyridoxine used with altretamine -rituximab -some antibiotics like amikacin, gentamicin, neomycin, polymyxin B, streptomycin, tobramycin -sulfinpyrazone -vaccines -zalcitabine Talk to your doctor or health care professional before taking any of these medicines: -acetaminophen -aspirin -ibuprofen -ketoprofen -naproxen This list may not describe all possible interactions. Give your health care provider a list of all the medicines, herbs, non-prescription drugs, or dietary supplements you use. Also tell them if you smoke, drink alcohol, or use illegal drugs. Some items may interact with your medicine. What should I watch for while using this medicine? Your condition will be monitored carefully while you are receiving this medicine. You will need important blood work done while you are taking this medicine. This drug may make you feel generally unwell. This is not uncommon, as chemotherapy can affect healthy cells as well as cancer cells. Report any side effects. Continue your course of treatment even though you feel ill unless your doctor tells you to stop. In some cases, you may be given additional medicines to help with side effects.  Follow all directions for their use. Call your doctor or health care professional for  advice if you get a fever, chills or sore throat, or other symptoms of a cold or flu. Do not treat yourself. This drug decreases your body's ability to fight infections. Try to avoid being around people who are sick. This medicine may increase your risk to bruise or bleed. Call your doctor or health care professional if you notice any unusual bleeding. Be careful brushing and flossing your teeth or using a toothpick because you may get an infection or bleed more easily. If you have any dental work done, tell your dentist you are receiving this medicine. Avoid taking products that contain aspirin, acetaminophen, ibuprofen, naproxen, or ketoprofen unless instructed by your doctor. These medicines may hide a fever. Do not become pregnant while taking this medicine. Women should inform their doctor if they wish to become pregnant or think they might be pregnant. There is a potential for serious side effects to an unborn child. Talk to your health care professional or pharmacist for more information. Do not breast-feed an infant while taking this medicine. Drink fluids as directed while you are taking this medicine. This will help protect your kidneys. Call your doctor or health care professional if you get diarrhea. Do not treat yourself. What side effects may I notice from receiving this medicine? Side effects that you should report to your doctor or health care professional as soon as possible: -allergic reactions like skin rash, itching or hives, swelling of the face, lips, or tongue -signs of infection - fever or chills, cough, sore throat, pain or difficulty passing urine -signs of decreased platelets or bleeding - bruising, pinpoint red spots on the skin, black, tarry stools, nosebleeds -signs of decreased red blood cells - unusually weak or tired, fainting spells, lightheadedness -breathing problems -changes in hearing -gout pain -low blood counts - This drug may decrease the number of white blood  cells, red blood cells and platelets. You may be at increased risk for infections and bleeding. -nausea and vomiting -pain, swelling, redness or irritation at the injection site -pain, tingling, numbness in the hands or feet -problems with balance, movement -trouble passing urine or change in the amount of urine Side effects that usually do not require medical attention (report to your doctor or health care professional if they continue or are bothersome): -changes in vision -loss of appetite -metallic taste in the mouth or changes in taste This list may not describe all possible side effects. Call your doctor for medical advice about side effects. You may report side effects to FDA at 1-800-FDA-1088. Where should I keep my medicine? This drug is given in a hospital or clinic and will not be stored at home. NOTE: This sheet is a summary. It may not cover all possible information. If you have questions about this medicine, talk to your doctor, pharmacist, or health care provider.  2014, [employer]. (2007-10-30 14:40:54)  Pemetrexed injection - Alimta What is this medicine? PEMETREXED (PEM e TREX ed) is a chemotherapy drug. This medicine affects cells that are rapidly growing, such as cancer cells and cells in your mouth and stomach. It is usually used to treat lung cancers like non-small cell lung cancer and mesothelioma. It may also be used to treat other cancers. This medicine may be used for other purposes; ask your health care provider or pharmacist if you have questions. COMMON BRAND NAME(S): Alimta What should I tell my health care provider before I  take this medicine? They need to know if you have any of these conditions: -if you frequently drink alcohol containing beverages -infection (especially a virus infection such as chickenpox, cold sores, or herpes) -kidney disease -liver disease -low blood counts, like low platelets, red bloods, or white blood cells -an unusual  or allergic reaction to pemetrexed, mannitol, other medicines, foods, dyes, or preservatives -pregnant or trying to get pregnant -breast-feeding How should I use this medicine? This drug is given as an infusion into a vein. It is administered in a hospital or clinic by a specially trained health care professional. Talk to your pediatrician regarding the use of this medicine in children. Special care may be needed. Overdosage: If you think you have taken too much of this medicine contact a poison control center or emergency room at once. NOTE: This medicine is only for you. Do not share this medicine with others. What if I miss a dose? It is important not to miss your dose. Call your doctor or health care professional if you are unable to keep an appointment. What may interact with this medicine? -aspirin and aspirin-like medicines -medicines to increase blood counts like filgrastim, pegfilgrastim, sargramostim -methotrexate -NSAIDS, medicines for pain and inflammation, like ibuprofen or naproxen -probenecid -pyrimethamine -vaccines Talk to your doctor or health care professional before taking any of these medicines: -acetaminophen -aspirin -ibuprofen -ketoprofen -naproxen This list may not describe all possible interactions. Give your health care provider a list of all the medicines, herbs, non-prescription drugs, or dietary supplements you use. Also tell them if you smoke, drink alcohol, or use illegal drugs. Some items may interact with your medicine. What should I watch for while using this medicine? Visit your doctor for checks on your progress. This drug may make you feel generally unwell. This is not uncommon, as chemotherapy can affect healthy cells as well as cancer cells. Report any side effects. Continue your course of treatment even though you feel ill unless your doctor tells you to stop. In some cases, you may be given additional medicines to help with side effects. Follow all  directions for their use. Call your doctor or health care professional for advice if you get a fever, chills or sore throat, or other symptoms of a cold or flu. Do not treat yourself. This drug decreases your body's ability to fight infections. Try to avoid being around people who are sick. This medicine may increase your risk to bruise or bleed. Call your doctor or health care professional if you notice any unusual bleeding. Be careful brushing and flossing your teeth or using a toothpick because you may get an infection or bleed more easily. If you have any dental work done, tell your dentist you are receiving this medicine. Avoid taking products that contain aspirin, acetaminophen, ibuprofen, naproxen, or ketoprofen unless instructed by your doctor. These medicines may hide a fever. Call your doctor or health care professional if you get diarrhea or mouth sores. Do not treat yourself. To protect your kidneys, drink water or other fluids as directed while you are taking this medicine. Men and women must use effective birth control while taking this medicine. You may also need to continue using effective birth control for a time after stopping this medicine. Do not become pregnant while taking this medicine. Tell your doctor right away if you think that you or your partner might be pregnant. There is a potential for serious side effects to an unborn child. Talk to your health care professional or  pharmacist for more information. Do not breast-feed an infant while taking this medicine. This medicine may lower sperm counts. What side effects may I notice from receiving this medicine? Side effects that you should report to your doctor or health care professional as soon as possible: -allergic reactions like skin rash, itching or hives, swelling of the face, lips, or tongue -low blood counts - this medicine may decrease the number of white blood cells, red blood cells and platelets. You may be at increased  risk for infections and bleeding. -signs of infection - fever or chills, cough, sore throat, pain or difficulty passing urine -signs of decreased platelets or bleeding - bruising, pinpoint red spots on the skin, black, tarry stools, blood in the urine -signs of decreased red blood cells - unusually weak or tired, fainting spells, lightheadedness -breathing problems, like a dry cough -changes in emotions or moods -chest pain -confusion -diarrhea -high blood pressure -mouth or throat sores or ulcers -pain, swelling, warmth in the leg -pain on swallowing -swelling of the ankles, feet, hands -trouble passing urine or change in the amount of urine -vomiting -yellowing of the eyes or skin Side effects that usually do not require medical attention (report to your doctor or health care professional if they continue or are bothersome): -hair loss -loss of appetite -nausea -stomach upset This list may not describe all possible side effects. Call your doctor for medical advice about side effects. You may report side effects to FDA at 1-800-FDA-1088. Where should I keep my medicine? This drug is given in a hospital or clinic and will not be stored at home. NOTE: This sheet is a summary. It may not cover all possible information. If you have questions about this medicine, talk to your doctor, pharmacist, or health care provider.  2014, [employer]. (2008-02-26 13:24:03)

## 2013-08-22 ENCOUNTER — Telehealth: Payer: Self-pay | Admitting: *Deleted

## 2013-08-22 ENCOUNTER — Ambulatory Visit: Payer: Medicare Other

## 2013-08-22 NOTE — Telephone Encounter (Signed)
Spoke with pt for post chemo follow up call.  Per pt, she is doing fine.  Denied nausea/vomiting, good appetite and drinking lots of fluids as tolerated.  Stated bowel and bladder function fine. Denied pain.  Pt stated she is taking Dexamethasone as instructed by md.  Pt aware of next office visit, and understood to call office with any new problems. Reinforced with pt not to drive after taking pain meds and antiemetics as they can cause drowsiness.  Pt voiced understanding.

## 2013-08-27 ENCOUNTER — Other Ambulatory Visit: Payer: Self-pay | Admitting: *Deleted

## 2013-08-27 ENCOUNTER — Encounter (HOSPITAL_COMMUNITY): Payer: Self-pay

## 2013-08-27 DIAGNOSIS — C3410 Malignant neoplasm of upper lobe, unspecified bronchus or lung: Secondary | ICD-10-CM

## 2013-08-28 ENCOUNTER — Telehealth: Payer: Self-pay | Admitting: Internal Medicine

## 2013-08-28 ENCOUNTER — Telehealth: Payer: Self-pay | Admitting: *Deleted

## 2013-08-28 ENCOUNTER — Encounter: Payer: Self-pay | Admitting: Physician Assistant

## 2013-08-28 ENCOUNTER — Other Ambulatory Visit: Payer: Medicare Other

## 2013-08-28 ENCOUNTER — Other Ambulatory Visit (HOSPITAL_BASED_OUTPATIENT_CLINIC_OR_DEPARTMENT_OTHER): Payer: Medicare Other

## 2013-08-28 ENCOUNTER — Ambulatory Visit (HOSPITAL_BASED_OUTPATIENT_CLINIC_OR_DEPARTMENT_OTHER): Payer: Medicare Other | Admitting: Physician Assistant

## 2013-08-28 VITALS — BP 142/68 | HR 68 | Temp 98.1°F | Resp 19 | Ht 64.0 in | Wt 131.5 lb

## 2013-08-28 DIAGNOSIS — C3410 Malignant neoplasm of upper lobe, unspecified bronchus or lung: Secondary | ICD-10-CM

## 2013-08-28 LAB — CBC WITH DIFFERENTIAL/PLATELET
BASO%: 0.2 % (ref 0.0–2.0)
BASOS ABS: 0 10*3/uL (ref 0.0–0.1)
EOS%: 2.2 % (ref 0.0–7.0)
Eosinophils Absolute: 0.1 10*3/uL (ref 0.0–0.5)
HEMATOCRIT: 37.4 % (ref 34.8–46.6)
HEMOGLOBIN: 12.9 g/dL (ref 11.6–15.9)
LYMPH%: 36.9 % (ref 14.0–49.7)
MCH: 31.7 pg (ref 25.1–34.0)
MCHC: 34.5 g/dL (ref 31.5–36.0)
MCV: 91.9 fL (ref 79.5–101.0)
MONO#: 0.3 10*3/uL (ref 0.1–0.9)
MONO%: 5.3 % (ref 0.0–14.0)
NEUT#: 3 10*3/uL (ref 1.5–6.5)
NEUT%: 55.4 % (ref 38.4–76.8)
Platelets: 186 10*3/uL (ref 145–400)
RBC: 4.07 10*6/uL (ref 3.70–5.45)
RDW: 12.6 % (ref 11.2–14.5)
WBC: 5.5 10*3/uL (ref 3.9–10.3)
lymph#: 2 10*3/uL (ref 0.9–3.3)

## 2013-08-28 LAB — COMPREHENSIVE METABOLIC PANEL (CC13)
ALK PHOS: 80 U/L (ref 40–150)
ALT: 25 U/L (ref 0–55)
AST: 19 U/L (ref 5–34)
Albumin: 4 g/dL (ref 3.5–5.0)
Anion Gap: 10 mEq/L (ref 3–11)
BILIRUBIN TOTAL: 0.52 mg/dL (ref 0.20–1.20)
BUN: 21.3 mg/dL (ref 7.0–26.0)
CALCIUM: 9.9 mg/dL (ref 8.4–10.4)
CO2: 33 mEq/L — ABNORMAL HIGH (ref 22–29)
CREATININE: 0.8 mg/dL (ref 0.6–1.1)
Chloride: 90 mEq/L — ABNORMAL LOW (ref 98–109)
Glucose: 100 mg/dl (ref 70–140)
Potassium: 3.5 mEq/L (ref 3.5–5.1)
Sodium: 133 mEq/L — ABNORMAL LOW (ref 136–145)
Total Protein: 6.7 g/dL (ref 6.4–8.3)

## 2013-08-28 LAB — MAGNESIUM (CC13): Magnesium: 1.9 mg/dl (ref 1.5–2.5)

## 2013-08-28 MED ORDER — FOLIC ACID 1 MG PO TABS: 1.0000 mg | ORAL_TABLET | Freq: Every day | ORAL | Status: DC

## 2013-08-28 NOTE — Patient Instructions (Signed)
Continue weekly labs as scheduled Follow up in 2 weeks, prior to the start of your next cycle of chemotherapy

## 2013-08-28 NOTE — Telephone Encounter (Signed)
Gave pt appt for lab md and chemo for January and february 2015

## 2013-08-28 NOTE — Telephone Encounter (Signed)
Per staff message and POF I have scheduled appts.  JMW  

## 2013-08-28 NOTE — Telephone Encounter (Signed)
Talked to pt and gave her appt for lab,Ml and chemo for February 2015

## 2013-08-28 NOTE — Progress Notes (Signed)
No images are attached to the encounter. No scans are attached to the encounter. No scans are attached to the encounter. [facility] SHARED VISIT PROGRESS NOTE  ROBERTS, Sharol Given, MD [address], [place] [address]  DIAGNOSIS: Cancer of upper lobe of lung   Primary site: Lung (Right)   Staging method: AJCC 7th Edition   Clinical: (T1a, N0)   Pathologic: Stage IA (T1a, N0, cM0) signed by [name], MD on 07/09/2013  1:58 PM   Summary: Stage IA (T1a, N0, cM0) Lung cancer, Right upper lobe   Primary site: Lung (Right)   Pathologic: Stage IB (T2a, N0, cM0) signed by [name], MD on 07/09/2013  1:54 PM   Summary: Stage IB (T2a, N0, cM0)  PRIOR THERAPY: Status post right video-assisted thoracoscopy, wedge resection, right upper lobe with completion of right upper lobectomy with lymph node dissection under the care of Dr. Servando Snare on 07/08/2013  CURRENT THERAPY: Adjuvant chemotherapy with systemic chemotherapy in the form of cisplatin at 75 mg per meter squared and Alimta 500 mg per meter squared given every 3 weeks for a total of 4 cycles, status post 1 cycle.  DISEASE STAGE: Cancer of upper lobe of lung   Primary site: Lung (Right)   Staging method: AJCC 7th Edition   Clinical: (T1a, N0)   Pathologic: Stage IA (T1a, N0, cM0) signed by [name], MD on 07/09/2013  1:58 PM   Summary: Stage IA (T1a, N0, cM0) Lung cancer, Right upper lobe   Primary site: Lung (Right)   Pathologic: Stage IB (T2a, N0, cM0) signed by [name], MD on 07/09/2013  1:54 PM   Summary: Stage IB (T2a, N0, cM0)  CHEMOTHERAPY INTENT: Curative  CURRENT # OF CHEMOTHERAPY CYCLES: 1  CURRENT ANTIEMETICS: Aloxi, dexamethasone, Compazine  CURRENT SMOKING STATUS: Former smoker, quit 08/08/1978  ORAL CHEMOTHERAPY AND CONSENT: n/a  CURRENT BISPHOSPHONATES USE: none  PAIN MANAGEMENT: Percocent  NARCOTICS INDUCED CONSTIPATION:  none  LIVING WILL AND CODE  STATUS: ?   INTERVAL HISTORY: Francille Wittmann Ehle 68 y.o. 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 Medical molecular biomarkers were negative for EGFR mutation and negative for ALK gene translocation. She is currently receiving adjuvant chemotherapy with cisplatin at 75 mg per meter squared and Alimta 500 mg per meter squared given every 3 weeks, status post 1 cycle. A total 4 cycles are planned. She requests a refill for her folic acid. Overall she tolerated her first cycle of chemotherapy relatively well with the exception of some fatigue and malaise. Her appetite has remained good and she denied any problems with nausea, vomiting, diarrhea or constipation. She's developed a cough over the past couple of days and has some audible sounds periodically in the right upper lung region. As previously stated she's had no fever chills and has a nonproductive cough. She states she has a followup appointment with Dr. Servando Snare 08/29/2013 and she will have a chest x-ray at this visit. She voiced no other specific complaints today.   MEDICAL HISTORY: Past Medical History  Diagnosis Date  . Hyperlipidemia     takes Zocor daily  . Cavitating mass in right upper lung lobe   . Lung nodules  right upper lobe  . GERD (gastroesophageal reflux disease)     takes Nexium daily  . Hypertension     takes Amlodipine daily and Chlorthalidone as well  . Dizziness     occasionally   . Arthritis     back   . History of colon polyps   . Diverticulosis   . Family history of kidney infection     pt history-saw Dr.Wrenn  . Nocturia   . Lung cancer, Right upper lobe     ALLERGIES:  has No Known Allergies.  MEDICATIONS:  Current Outpatient Prescriptions   Medication Sig Dispense Refill  . amLODipine (NORVASC) 5 MG tablet Take 5 mg by mouth daily.      . chlorthalidone (HYGROTON) 25 MG tablet Take 25 mg by mouth daily.      . Cholecalciferol (VITAMIN D-3 PO) Take 1 tablet by mouth 2 (two) times a week.       . esomeprazole (NEXIUM) 40 MG capsule Take 40 mg by mouth daily before breakfast.      . folic acid (FOLVITE) 1 MG tablet Take 1 tablet (1 mg total) by mouth daily.  30 tablet  2  . Multiple Vitamins-Minerals (PRESERVISION AREDS PO) Take 1 tablet by mouth daily.       Marland Kitchen oxyCODONE-acetaminophen (PERCOCET/ROXICET) 5-325 MG per tablet Take 1-2 tablets by mouth every 8 (eight) hours as needed for severe pain.  40 tablet  0  . prochlorperazine (COMPAZINE) 10 MG tablet Take 1 tablet (10 mg total) by mouth every 6 (six) hours as needed for nausea or vomiting.  60 tablet  0  . simvastatin (ZOCOR) 20 MG tablet Take 20 mg by mouth every evening.      Marland Kitchen dexamethasone (DECADRON) 4 MG tablet 4 Milligram by mouth twice a day the day before, day of and day after the chemotherapy every 3 weeks.  30 tablet  0  . potassium chloride SA (K-DUR,KLOR-CON) 20 MEQ tablet Take 1 tablet (20 mEq total) by mouth 2 (two) times daily.  60 tablet  0   No current facility-administered medications for this visit.    SURGICAL HISTORY:  Past Surgical History  Procedure Laterality Date  . Cardiovascular stress test  06/29/2009    EF 79%, NO ISCHEMIA  . Tracheostomy      at age 67 or 5 d/t polyps on vocal cord  . Abdominal hysterectomy      at age 55;partial   . Colonoscopy    . Video bronchoscopy N/A 07/08/2013    Procedure: VIDEO BRONCHOSCOPY;  Surgeon: [name], MD;  Location: [facility] OR;  Service: Thoracic;  Laterality: N/A;  . Video assisted thoracoscopy (vats)/wedge resection Right 07/08/2013    Procedure: VIDEO ASSISTED THORACOSCOPY (VATS)/WEDGE RESECTION;  Surgeon: [name], MD;  Location: [facility];  Service: Thoracic;  Laterality: Right;    REVIEW OF  SYSTEMS:  Constitutional: positive for fatigue and malaise Eyes: negative Ears, nose, mouth, throat, and face: negative Respiratory: positive for cough Cardiovascular: negative Gastrointestinal: negative Genitourinary:negative Integument/breast: negative Hematologic/lymphatic: negative Musculoskeletal:negative Neurological: negative Behavioral/Psych: negative Endocrine: negative Allergic/Immunologic: negative   PHYSICAL EXAMINATION: General appearance: alert, cooperative, appears stated age and no distress Head: Normocephalic, without obvious abnormality, atraumatic Neck: no adenopathy, no carotid bruit, no JVD, supple, symmetrical, trachea midline and thyroid not enlarged, symmetric, no tenderness/mass/nodules Lymph nodes: Cervical, supraclavicular, and axillary nodes normal. Resp: Very few crackles in the right mid lung field otherwise the lung exam is clear to auscultation without wheezes rales  or rhonchi. The right lateral incisions are all well-healed without any evidence of infection or dehiscence Back: symmetric, no curvature. ROM normal. No CVA tenderness. Cardio: regular rate and rhythm, S1, S2 normal, no murmur, click, rub or gallop GI: soft, non-tender; bowel sounds normal; no masses,  no organomegaly Extremities: extremities normal, atraumatic, no cyanosis or edema Neurologic: Alert and oriented X 3, normal strength and tone. Normal symmetric reflexes. Normal coordination and gait  ECOG PERFORMANCE STATUS: 0 - Asymptomatic  Blood pressure 142/68, pulse 68, temperature 98.1 F (36.7 C), temperature source Oral, resp. rate 19, height $RemoveBe'5\' 4"'IgfTLEDaf$  (1.626 m), weight 131 lb 8 oz (59.648 kg).  LABORATORY DATA: Lab Results  Component Value Date   WBC 5.5 08/28/2013   HGB 12.9 08/28/2013   HCT 37.4 08/28/2013   MCV 91.9 08/28/2013   PLT 186 08/28/2013      Chemistry      Component Value Date/Time   NA 133* 08/28/2013 0836   NA 134* 07/14/2013 0452   K 3.5 08/28/2013 0836   K 3.5  07/14/2013 0452   CL 93* 07/14/2013 0452   CO2 33* 08/28/2013 0836   CO2 32 07/14/2013 0452   BUN 21.3 08/28/2013 0836   BUN 18 07/14/2013 0452   CREATININE 0.8 08/28/2013 0836   CREATININE 0.87 07/14/2013 0452      Component Value Date/Time   CALCIUM 9.9 08/28/2013 0836   CALCIUM 9.4 07/14/2013 0452   ALKPHOS 80 08/28/2013 0836   ALKPHOS 89 07/10/2013 0400   AST 19 08/28/2013 0836   AST 31 07/10/2013 0400   ALT 25 08/28/2013 0836   ALT 21 07/10/2013 0400   BILITOT 0.52 08/28/2013 0836   BILITOT 0.3 07/10/2013 0400       RADIOGRAPHIC STUDIES:  No results found.   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 She is status post right upper lobectomy under the care Dr. Servando Snare on 07/08/2013. She's currently being treated with adjuvant chemotherapy in the form of cisplatin 75 mg per meter squared and Alimta at 500 mg per meter square given every 3 weeks, status post 1 cycle. Overall she's tolerating her adjuvant chemotherapy without difficulty. Patient was discussed with him also seen by Dr. Julien Nordmann. She will continue with weekly labs as scheduled. She will followup in 2 weeks prior to the start of cycle #2 also with a repeat CBC differential, C. met and magnesium. A refill prescription for her folic acid was sent to her pharmacy of record via E. scribed.    Wynetta Emery, Adalbert Alberto E, PA-C    All questions were answered. The patient knows to call the clinic with any problems, questions or concerns. We can certainly see the patient much sooner if necessary.  ADDENDUM: Hematology/Oncology Attending: I had the face to face encounter with the patient today. I recommended her care plan. The patient is a very pleasant 68 years old white female with  history of synchronous non-small cell lung cancer currently undergoing adjuvant chemotherapy with cisplatin and Alimta status post 1 cycle. She tolerated the first cycle of her treatment fairly well with no significant adverse effects. She denied having any nausea or vomiting. The patient would come back for followup visit in 2 weeks for reevaluation with the start of cycle #2. She  was advised to call immediately she has any concerning symptoms in the interval.  Disclaimer: This note was dictated with voice recognition software. Similar sounding words can inadvertently be transcribed and may not be corrected upon review. Eilleen Kempf., MD 08/28/2013

## 2013-08-29 ENCOUNTER — Encounter: Payer: Self-pay | Admitting: *Deleted

## 2013-08-29 ENCOUNTER — Ambulatory Visit (INDEPENDENT_AMBULATORY_CARE_PROVIDER_SITE_OTHER): Payer: Self-pay | Admitting: Cardiothoracic Surgery

## 2013-08-29 ENCOUNTER — Encounter: Payer: Self-pay | Admitting: Cardiothoracic Surgery

## 2013-08-29 ENCOUNTER — Ambulatory Visit
Admission: RE | Admit: 2013-08-29 | Discharge: 2013-08-29 | Disposition: A | Discharge status: Home / Self Care | Payer: Medicare Other | Source: Ambulatory Visit | Attending: Cardiothoracic Surgery | Admitting: Cardiothoracic Surgery

## 2013-08-29 VITALS — BP 118/70 | HR 86 | Resp 16 | Ht 64.0 in | Wt 132.0 lb

## 2013-08-29 DIAGNOSIS — G8918 Other acute postprocedural pain: Secondary | ICD-10-CM

## 2013-08-29 DIAGNOSIS — Z902 Acquired absence of lung [part of]: Secondary | ICD-10-CM

## 2013-08-29 DIAGNOSIS — C3410 Malignant neoplasm of upper lobe, unspecified bronchus or lung: Secondary | ICD-10-CM

## 2013-08-29 DIAGNOSIS — Z9889 Other specified postprocedural states: Secondary | ICD-10-CM

## 2013-08-29 MED ORDER — OXYCODONE-ACETAMINOPHEN 5-325 MG PO TABS: 1.0000 | ORAL_TABLET | Freq: Three times a day (TID) | ORAL | Status: DC | PRN

## 2013-08-29 NOTE — Progress Notes (Signed)
[address]       [name] [address]             [phone]                  [name] [name] Medical Record #[id number] Date of Birth: March 22, 1946  [name], MD [name], MD  Chief Complaint:   PostOp Follow Up Visit 07/08/2013   OPERATIVE REPORT  PREOPERATIVE DIAGNOSIS: Right upper lobe lung mass.  POSTOPERATIVE DIAGNOSIS: Carcinoma of the right upper lobe.  PROCEDURE PERFORMED: Bronchoscopy, right video-assisted thoracoscopy,  wedge resection, right upper lobe, with completion of right upper  lobectomy, lymph node dissection, and placement of On-Q device.  SURGEON: Lanelle Bal, MD  PATH: ALK EGFR negative , [name]  mRNA testing completed see scanned document Diagnosis 1. 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 - PERINEURAL INVASION IS IDENTIFIED. - THE SURGICAL RESECTION MARGINS ARE NEGATIVE FOR ADENOCARCINOMA. - SEE ONCOLOGY TABLE BELOW. 2. 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 - ATYPICAL ADENOMATOUS HYPERPLASIA. - THERE IS NO EVIDENCE OF CARCINOMA IN 2 OF 2 LYMPH NODES (0/2). - THE SURGICAL RESECTION MARGINS ARE NEGATIVE FOR CARCINOMA. - SEE ONCOLOGY TABLE BELOW. 3. Lymph node, biopsy, 10 R - THERE IS NO EVIDENCE OF CARCINOMA IN 1 OF 1 LYMPH NODE (0/1). 1 of 4 FINAL for [name], [name] [phone]) Diagnosis(continued) 4. Lymph node, biopsy, 11 R - THERE IS NO EVIDENCE OF CARCINOMA IN 1 OF 1 LYMPH NODE (0/1). 5. Lymph node, biopsy, 12 R - THERE IS NO EVIDENCE OF CARCINOMA IN 1 OF 1 LYMPH NODE (0/1) 6. Lymph node, biopsy, 13 R - THERE IS NO EVIDENCE OF CARCINOMA IN 1 OF 1 LYMPH NODE (0/1). 7. Lymph node, biopsy, 4 R - THERE IS NO EVIDENCE OF CARCINOMA IN 1 OF 1 LYMPH NODE (0/1). Microscopic Comment 1. LUNG (Part 1) Specimen, including  laterality: Right upper lobe Procedure: Wedge resection followed by completion lobectomy Specimen integrity (intact/disrupted): Intact Tumor site: Right upper lobe Tumor focality: Adenocarcinoma is unifocal Maximum tumor size (cm): 4.3 cm (gross measurement) Histologic type: Adenocarcinoma. Grade: Well differentiated (low grade Margins: Negative for adenocarcinoma Distance to closest margin (cm): 0.8 cm to the nearest stapled margin (gross measurement) Visceral pleura invasion: Not identified Tumor extension: Confined to lung parenchyma Treatment effect (if treated with neoadjuvant therapy): N/A Lymph -Vascular invasion: Not identified Lymph nodes: Number examined - 7; Number N1 nodes positive 0; Number N2 nodes positive 0 TNM code: pT2a, pN0 Ancillary studies: A block will be sent for EGFR and ALK studies and the results reported separately. Comments: The vast majority of the adenocarcinoma has a lepidic growth pattern (so called in-situ adenocarcinoma). However, there are a few small foci of stromal invasion identified. 2. LUNG (Part 2) Specimen, including laterality: Right upper lobe Procedure: Completion lobectomy Specimen integrity (intact/disrupted): Intact Tumor site: Perihilar Tumor focality: Squamous cell carcinoma is unifocal Maximum tumor size (cm): 1.6 cm (gross measurement) Histologic type: Squamous cell carcinoma Grade: Well differentiated (low grade) Margins: Negative for carcinoma Distance to closest margin (cm): 1.5 cm to the bronchial resection margin (gross measurement) Visceral pleura invasion: Not identified. Tumor extension: Confined to lung parenchyma Treatment effect (if treated with neoadjuvant therapy): N/A Lymph -Vascular invasion: Not identified Lymph nodes: Number examined - 7; Number N1 nodes positive 0; Number N2 nodes positive  0 TNM code: pT1a, pN0 Ancillary studies: N/A Comments: The squamous cell carcinoma is predominantly in-situ. However, there  is a single focus suspicious for early invasion.   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 She is making good progress postoperatively. Increasing her physical activity appropriately level. She denies any significant shortness of breath. Has had no fever chills. Her incisions are healing well. She has had some constipation related to pain medication.  CURRENT THERAPY: Adjuvant chemotherapy with systemic chemotherapy in the form of cisplatin at 75 mg per meter squared and Alimta 500 mg per meter squared given every 3 weeks for a total of 4 cycles, status post 1 cycle   History  Smoking status  . Former Smoker  . Types: Cigarettes  . Quit date: 08/08/1978  Smokeless tobacco  . Never Used    Comment: quit 35 yrs ago       No Known Allergies  Current Outpatient Prescriptions  Medication Sig Dispense Refill  . amLODipine (NORVASC) 5 MG tablet Take 5 mg by mouth daily.      . chlorthalidone (HYGROTON) 25 MG tablet Take 25 mg by mouth daily.      . Cholecalciferol (VITAMIN D-3 PO) Take 1 tablet by mouth 2 (two) times a week.       Marland Kitchen dexamethasone (DECADRON) 4 MG tablet 4 Milligram by mouth twice a day the day before, day of and day after the chemotherapy every 3 weeks.  30 tablet  0  . esomeprazole (NEXIUM) 40 MG capsule Take 40 mg by mouth daily before breakfast.      . folic acid (FOLVITE) 1 MG tablet Take 1 tablet (1 mg total) by mouth daily.  30 tablet  2  . Multiple Vitamins-Minerals (PRESERVISION AREDS PO) Take 1 tablet by mouth daily.       Marland Kitchen oxyCODONE-acetaminophen (PERCOCET/ROXICET) 5-325 MG per tablet Take 1-2 tablets by mouth every 8 (eight) hours as needed for severe pain.  40 tablet  0  . potassium chloride SA (K-DUR,KLOR-CON) 20 MEQ tablet Take 1 tablet (20 mEq total) by mouth  2 (two) times daily.  60 tablet  0  . prochlorperazine (COMPAZINE) 10 MG tablet Take 1 tablet (10 mg total) by mouth every 6 (six) hours as needed for nausea or vomiting.  60 tablet  0  . simvastatin (ZOCOR) 20 MG tablet Take 20 mg by mouth every evening.       No current facility-administered medications for this visit.       Physical Exam: BP 118/70  Pulse 86  Resp 16  Ht $R'5\' 4"'LL$  (1.626 m)  Wt 132 lb (59.875 kg)  BMI 22.65 kg/m2  SpO2 98%  General appearance: alert and cooperative Neurologic: intact Heart: regular rate and rhythm, S1, S2 normal, no murmur, click, rub or gallop Lungs: clear to auscultation bilaterally Abdomen: soft, non-tender; bowel sounds normal; no masses,  no organomegaly Extremities: extremities normal, atraumatic, no cyanosis or edema and Homans sign is negative, no sign of DVT Wound: Right chest port sites/incisions healing well, sutures removed   Diagnostic Studies & Laboratory data:         Recent Radiology Findings: Dg Chest 2 View  08/29/2013   CLINICAL DATA:  Status post lung cancer, right upper lobectomy December 2014. Cough.  EXAM:  CHEST  2 VIEW  COMPARISON:  July 25, 2013  FINDINGS: The heart size and mediastinal contours are stable. Postoperative changes are identified within the right lung. The previously noted right apical pleural line persists but the pneumothorax component is decreased compared to prior exam. There is no pulmonary edema or focal pneumonia. The visualized skeletal structures are stable.  IMPRESSION: Postoperative changes are identified within the right lung. The previously noted right apical pleural line persists but the pneumothorax component is decreased compared to prior exam.   Electronically Signed   By: Abelardo Diesel M.D.   On: 08/29/2013 14:43      Recent Labs: Lab Results  Component Value Date   WBC 5.5 08/28/2013   HGB 12.9 08/28/2013   HCT 37.4 08/28/2013   PLT 186 08/28/2013   GLUCOSE 100 08/28/2013   ALT 25  08/28/2013   AST 19 08/28/2013   NA 133* 08/28/2013   K 3.5 08/28/2013   CL 93* 07/14/2013   CREATININE 0.8 08/28/2013   BUN 21.3 08/28/2013   CO2 33* 08/28/2013   INR 0.89 07/03/2013      Assessment / Plan:    Patient stable after right upper lobectomy. She will continue to increase her physical activity appropriately.  Started Adjuvant chemotherapy with systemic chemotherapy in the form of cisplatin at 75 mg per meter squared and Alimta 500 mg per meter squared given every 3 weeks for a total of 4 cycles Plan to see back in 3 months with followup chest x-ray  Xitlally Mooneyham B 08/29/2013 3:12 PM

## 2013-08-29 NOTE — Progress Notes (Signed)
[facility] Psychosocial Distress Screening Clinical Social Work  Clinical Social Work was referred by distress screening protocol.  The patient scored a 8 on the Psychosocial Distress Thermometer which indicates severe distress. Clinical Social Worker contacted patient by phone to assess for distress and other psychosocial needs. The patient stated she had her first chemotherapy appointment and has had very few side effects.  She states her biggest concern is lack of energy.  She feels she has a positive perspective and is well supported by friends and family.  CSW encouraged patient to contact CSW with any questions or concerns.   Clinical Social Worker follow up needed: no  If yes, follow up plan:   Polo Riley, MSW, LCSW, OSW-C Clinical Social Worker [facility] [phone]

## 2013-09-04 ENCOUNTER — Telehealth: Payer: Self-pay | Admitting: *Deleted

## 2013-09-04 ENCOUNTER — Other Ambulatory Visit (HOSPITAL_BASED_OUTPATIENT_CLINIC_OR_DEPARTMENT_OTHER): Payer: Medicare Other

## 2013-09-04 DIAGNOSIS — C3410 Malignant neoplasm of upper lobe, unspecified bronchus or lung: Secondary | ICD-10-CM

## 2013-09-04 DIAGNOSIS — E876 Hypokalemia: Secondary | ICD-10-CM

## 2013-09-04 LAB — CBC WITH DIFFERENTIAL/PLATELET
BASO%: 0.7 % (ref 0.0–2.0)
BASOS ABS: 0 10*3/uL (ref 0.0–0.1)
EOS%: 2.7 % (ref 0.0–7.0)
Eosinophils Absolute: 0.1 10*3/uL (ref 0.0–0.5)
HEMATOCRIT: 37.4 % (ref 34.8–46.6)
HEMOGLOBIN: 12.9 g/dL (ref 11.6–15.9)
LYMPH#: 2.2 10*3/uL (ref 0.9–3.3)
LYMPH%: 55.2 % — ABNORMAL HIGH (ref 14.0–49.7)
MCH: 33.1 pg (ref 25.1–34.0)
MCHC: 34.6 g/dL (ref 31.5–36.0)
MCV: 95.7 fL (ref 79.5–101.0)
MONO#: 0.3 10*3/uL (ref 0.1–0.9)
MONO%: 8 % (ref 0.0–14.0)
NEUT#: 1.3 10*3/uL — ABNORMAL LOW (ref 1.5–6.5)
NEUT%: 33.4 % — AB (ref 38.4–76.8)
Platelets: 207 10*3/uL (ref 145–400)
RBC: 3.91 10*6/uL (ref 3.70–5.45)
RDW: 13.5 % (ref 11.2–14.5)
WBC: 4 10*3/uL (ref 3.9–10.3)

## 2013-09-04 LAB — COMPREHENSIVE METABOLIC PANEL (CC13)
ALBUMIN: 4.1 g/dL (ref 3.5–5.0)
ALT: 21 U/L (ref 0–55)
ANION GAP: 11 meq/L (ref 3–11)
AST: 20 U/L (ref 5–34)
Alkaline Phosphatase: 101 U/L (ref 40–150)
BUN: 13.6 mg/dL (ref 7.0–26.0)
CO2: 31 mEq/L — ABNORMAL HIGH (ref 22–29)
Calcium: 9.7 mg/dL (ref 8.4–10.4)
Chloride: 92 mEq/L — ABNORMAL LOW (ref 98–109)
Creatinine: 0.8 mg/dL (ref 0.6–1.1)
Glucose: 100 mg/dl (ref 70–140)
POTASSIUM: 3.2 meq/L — AB (ref 3.5–5.1)
Sodium: 134 mEq/L — ABNORMAL LOW (ref 136–145)
Total Bilirubin: 0.61 mg/dL (ref 0.20–1.20)
Total Protein: 6.6 g/dL (ref 6.4–8.3)

## 2013-09-04 LAB — MAGNESIUM (CC13): Magnesium: 1.8 mg/dl (ref 1.5–2.5)

## 2013-09-04 MED ORDER — POTASSIUM CHLORIDE CRYS ER 20 MEQ PO TBCR: 20.0000 meq | EXTENDED_RELEASE_TABLET | Freq: Every day | ORAL | Status: DC

## 2013-09-04 NOTE — Telephone Encounter (Signed)
Called patient with lab results and informed her that a prescription for potassium will be sent to her pharmacy.  Per Dr. Julien Nordmann.  Patient verbalized understanding.

## 2013-09-04 NOTE — Progress Notes (Signed)
Quick Note:  Call patient with the result and order K Dur 20 meq po qd X 7 ______

## 2013-09-04 NOTE — Telephone Encounter (Signed)
Message copied by Norma Fredrickson on Wed Sep 04, 2013  3:34 PM ------      Message from: Britt Bottom      Created: Wed Sep 04, 2013  3:22 PM                   ----- Message -----         From: Curt Bears, MD         Sent: 09/04/2013   3:14 PM           To: Carlton Adam, PA-C, #            Call patient with the result and order K Dur 20 meq po qd X 7 ------

## 2013-09-11 ENCOUNTER — Encounter: Payer: Self-pay | Admitting: Physician Assistant

## 2013-09-11 ENCOUNTER — Telehealth: Payer: Self-pay | Admitting: Internal Medicine

## 2013-09-11 ENCOUNTER — Telehealth: Payer: Self-pay | Admitting: *Deleted

## 2013-09-11 ENCOUNTER — Other Ambulatory Visit (HOSPITAL_BASED_OUTPATIENT_CLINIC_OR_DEPARTMENT_OTHER): Payer: Medicare Other

## 2013-09-11 ENCOUNTER — Ambulatory Visit (HOSPITAL_BASED_OUTPATIENT_CLINIC_OR_DEPARTMENT_OTHER): Payer: Medicare Other

## 2013-09-11 ENCOUNTER — Ambulatory Visit (HOSPITAL_BASED_OUTPATIENT_CLINIC_OR_DEPARTMENT_OTHER): Payer: Medicare Other | Admitting: Physician Assistant

## 2013-09-11 VITALS — BP 155/80 | HR 81 | Temp 98.5°F | Resp 18 | Ht 64.0 in | Wt 138.1 lb

## 2013-09-11 DIAGNOSIS — C3410 Malignant neoplasm of upper lobe, unspecified bronchus or lung: Secondary | ICD-10-CM

## 2013-09-11 DIAGNOSIS — J984 Other disorders of lung: Secondary | ICD-10-CM

## 2013-09-11 DIAGNOSIS — Z5111 Encounter for antineoplastic chemotherapy: Secondary | ICD-10-CM

## 2013-09-11 LAB — COMPREHENSIVE METABOLIC PANEL (CC13)
ALT: 19 U/L (ref 0–55)
ANION GAP: 10 meq/L (ref 3–11)
AST: 16 U/L (ref 5–34)
Albumin: 4.1 g/dL (ref 3.5–5.0)
Alkaline Phosphatase: 93 U/L (ref 40–150)
BILIRUBIN TOTAL: 0.26 mg/dL (ref 0.20–1.20)
BUN: 17.5 mg/dL (ref 7.0–26.0)
CHLORIDE: 94 meq/L — AB (ref 98–109)
CO2: 26 meq/L (ref 22–29)
Calcium: 9.6 mg/dL (ref 8.4–10.4)
Creatinine: 0.8 mg/dL (ref 0.6–1.1)
Glucose: 209 mg/dl — ABNORMAL HIGH (ref 70–140)
Potassium: 3.4 mEq/L — ABNORMAL LOW (ref 3.5–5.1)
Sodium: 130 mEq/L — ABNORMAL LOW (ref 136–145)
Total Protein: 6.4 g/dL (ref 6.4–8.3)

## 2013-09-11 LAB — MAGNESIUM (CC13): Magnesium: 2 mg/dl (ref 1.5–2.5)

## 2013-09-11 LAB — CBC WITH DIFFERENTIAL/PLATELET
BASO%: 0 % (ref 0.0–2.0)
Basophils Absolute: 0 10*3/uL (ref 0.0–0.1)
EOS%: 0 % (ref 0.0–7.0)
Eosinophils Absolute: 0 10*3/uL (ref 0.0–0.5)
HCT: 35.9 % (ref 34.8–46.6)
HGB: 12.5 g/dL (ref 11.6–15.9)
LYMPH%: 20.1 % (ref 14.0–49.7)
MCH: 32.5 pg (ref 25.1–34.0)
MCHC: 34.8 g/dL (ref 31.5–36.0)
MCV: 93.2 fL (ref 79.5–101.0)
MONO#: 0.3 10*3/uL (ref 0.1–0.9)
MONO%: 6 % (ref 0.0–14.0)
NEUT#: 3.3 10*3/uL (ref 1.5–6.5)
NEUT%: 73.9 % (ref 38.4–76.8)
PLATELETS: 283 10*3/uL (ref 145–400)
RBC: 3.85 10*6/uL (ref 3.70–5.45)
RDW: 13.9 % (ref 11.2–14.5)
WBC: 4.5 10*3/uL (ref 3.9–10.3)
lymph#: 0.9 10*3/uL (ref 0.9–3.3)

## 2013-09-11 MED ORDER — DEXAMETHASONE SODIUM PHOSPHATE 20 MG/5ML IJ SOLN
12.0000 mg | Freq: Once | INTRAMUSCULAR | Status: AC
  Administered 2013-09-11: 12 mg via INTRAVENOUS

## 2013-09-11 MED ORDER — DEXAMETHASONE SODIUM PHOSPHATE 20 MG/5ML IJ SOLN
INTRAMUSCULAR | Status: AC
  Filled 2013-09-11: qty 5

## 2013-09-11 MED ORDER — CISPLATIN CHEMO INJECTION 100MG/100ML
75.0000 mg/m2 | Freq: Once | INTRAVENOUS | Status: AC
  Administered 2013-09-11: 120 mg via INTRAVENOUS
  Filled 2013-09-11: qty 120

## 2013-09-11 MED ORDER — SODIUM CHLORIDE 0.9 % IV SOLN
500.0000 mg/m2 | Freq: Once | INTRAVENOUS | Status: AC
  Administered 2013-09-11: 825 mg via INTRAVENOUS
  Filled 2013-09-11: qty 33

## 2013-09-11 MED ORDER — SODIUM CHLORIDE 0.9 % IV SOLN
Freq: Once | INTRAVENOUS | Status: AC
  Administered 2013-09-11: 10:00:00 via INTRAVENOUS

## 2013-09-11 MED ORDER — SODIUM CHLORIDE 0.9 % IV SOLN
150.0000 mg | Freq: Once | INTRAVENOUS | Status: AC
  Administered 2013-09-11: 150 mg via INTRAVENOUS
  Filled 2013-09-11: qty 5

## 2013-09-11 MED ORDER — PALONOSETRON HCL INJECTION 0.25 MG/5ML
INTRAVENOUS | Status: AC
  Filled 2013-09-11: qty 5

## 2013-09-11 MED ORDER — POTASSIUM CHLORIDE 2 MEQ/ML IV SOLN
Freq: Once | INTRAVENOUS | Status: AC
  Administered 2013-09-11: 10:00:00 via INTRAVENOUS
  Filled 2013-09-11: qty 10

## 2013-09-11 MED ORDER — PALONOSETRON HCL INJECTION 0.25 MG/5ML
0.2500 mg | Freq: Once | INTRAVENOUS | Status: AC
  Administered 2013-09-11: 0.25 mg via INTRAVENOUS

## 2013-09-11 NOTE — Patient Instructions (Signed)
[facility] Discharge Instructions for Patients Receiving Chemotherapy  Today you received the following chemotherapy agents: alimta, cisplatin  To help prevent nausea and vomiting after your treatment, we encourage you to take your nausea medication.  Take it as often as prescribed.     If you develop nausea and vomiting that is not controlled by your nausea medication, call the clinic. If it is after clinic hours your family physician or the after hours number for the clinic or go to the Emergency Department.   BELOW ARE SYMPTOMS THAT SHOULD BE REPORTED IMMEDIATELY:  *FEVER GREATER THAN 100.5 F  *CHILLS WITH OR WITHOUT FEVER  NAUSEA AND VOMITING THAT IS NOT CONTROLLED WITH YOUR NAUSEA MEDICATION  *UNUSUAL SHORTNESS OF BREATH  *UNUSUAL BRUISING OR BLEEDING  TENDERNESS IN MOUTH AND THROAT WITH OR WITHOUT PRESENCE OF ULCERS  *URINARY PROBLEMS  *BOWEL PROBLEMS  UNUSUAL RASH Items with * indicate a potential emergency and should be followed up as soon as possible.  Feel free to call the clinic you have any questions or concerns. The clinic phone number is [phone].   I have been informed and understand all the instructions given to me. I know to contact the clinic, my physician, or go to the Emergency Department if any problems should occur. I do not have any questions at this time, but understand that I may call the clinic during office hours   should I have any questions or need assistance in obtaining follow up care.    __________________________________________  _____________  __________ Signature of Patient or Authorized Representative            Date                   Time    __________________________________________ Nurse's Signature

## 2013-09-11 NOTE — Telephone Encounter (Signed)
Per staff message and POF I have scheduled appts.  JMW  

## 2013-09-11 NOTE — Patient Instructions (Signed)
Continue weekly labs as scheduled Followup in 3 weeks, prior to your next scheduled cycle of adjuvant chemotherapy

## 2013-09-11 NOTE — Telephone Encounter (Signed)
Gave pt appt for lab,md and chemo for 2/25 needs to be adjusted , emailed Michelle for next chemo

## 2013-09-11 NOTE — Progress Notes (Signed)
No images are attached to the encounter. No scans are attached to the encounter. No scans are attached to the encounter. [facility] SHARED VISIT PROGRESS NOTE  ROBERTS, Vernie Ammons, MD [address] [place] [address]  DIAGNOSIS: Cancer of upper lobe of lung   Primary site: Lung (Right)   Staging method: AJCC 7th Edition   Clinical: (T1a, N0)   Pathologic: Stage IA (T1a, N0, cM0) signed by Delight Ovens, MD on 07/09/2013  1:58 PM   Summary: Stage IA (T1a, N0, cM0) Lung cancer, Right upper lobe   Primary site: Lung (Right)   Pathologic: Stage IB (T2a, N0, cM0) signed by Delight Ovens, MD on 07/09/2013  1:54 PM   Summary: Stage IB (T2a, N0, cM0)  PRIOR THERAPY: Status post right video-assisted thoracoscopy, wedge resection, right upper lobe with completion of right upper lobectomy with lymph node dissection under the care of Dr. Tyrone Sage on 07/08/2013  CURRENT THERAPY: Adjuvant chemotherapy with systemic chemotherapy in the form of cisplatin at 75 mg per meter squared and Alimta 500 mg per meter squared given every 3 weeks for a total of 4 cycles, status post 1 cycle.  DISEASE STAGE: Cancer of upper lobe of lung   Primary site: Lung (Right)   Staging method: AJCC 7th Edition   Clinical: (T1a, N0)   Pathologic: Stage IA (T1a, N0, cM0) signed by Delight Ovens, MD on 07/09/2013  1:58 PM   Summary: Stage IA (T1a, N0, cM0) Lung cancer, Right upper lobe   Primary site: Lung (Right)   Pathologic: Stage IB (T2a, N0, cM0) signed by Delight Ovens, MD on 07/09/2013  1:54 PM   Summary: Stage IB (T2a, N0, cM0)  CHEMOTHERAPY INTENT: Curative  CURRENT # OF CHEMOTHERAPY CYCLES: 2  CURRENT ANTIEMETICS: Aloxi, dexamethasone, Compazine  CURRENT SMOKING STATUS: Former smoker, quit 08/08/1978  ORAL CHEMOTHERAPY AND CONSENT: n/a  CURRENT BISPHOSPHONATES USE: none  PAIN MANAGEMENT: Percocent  NARCOTICS INDUCED CONSTIPATION:  none  LIVING WILL AND CODE  STATUS: ?   INTERVAL HISTORY: [name] 68 y.o. 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 Medical molecular biomarkers were negative for EGFR mutation and negative for ALK gene translocation. She is currently receiving adjuvant chemotherapy with cisplatin at 75 mg per meter squared and Alimta 500 mg per meter squared given every 3 weeks, status post 1 cycle. A total 4 cycles are planned. Overall she's tolerating her treatment without difficulty. She states that the chest congestion and that she had prior to her followup visit with Dr. Tyrone Sage has totally resolved. Her appetite is improved and she's gained some weight. She denied any problems with nausea, vomiting, diarrhea or constipation. She voiced no other specific complaints today.   MEDICAL HISTORY: Past Medical History  Diagnosis Date  . Hyperlipidemia     takes Zocor daily  . Cavitating mass in right upper lung lobe   . Lung nodules     right upper lobe  . GERD (gastroesophageal reflux disease)     takes Nexium daily  . Hypertension     takes Amlodipine daily and Chlorthalidone as well  . Dizziness     occasionally   . Arthritis     back   . History  of colon polyps   . Diverticulosis   . Family history of kidney infection     pt history-saw Dr.Wrenn  . Nocturia   . Lung cancer, Right upper lobe     ALLERGIES:  has No Known Allergies.  MEDICATIONS:  Current Outpatient Prescriptions  Medication Sig Dispense Refill  . amLODipine (NORVASC) 5 MG tablet Take 5 mg by mouth daily.      . chlorthalidone (HYGROTON) 25 MG tablet Take 25 mg by mouth daily.      . Cholecalciferol (VITAMIN D-3 PO) Take 1 tablet by mouth 2 (two) times a week.       Marland Kitchen dexamethasone  (DECADRON) 4 MG tablet 4 Milligram by mouth twice a day the day before, day of and day after the chemotherapy every 3 weeks.  30 tablet  0  . esomeprazole (NEXIUM) 40 MG capsule Take 40 mg by mouth daily before breakfast.      . folic acid (FOLVITE) 1 MG tablet Take 1 tablet (1 mg total) by mouth daily.  30 tablet  2  . Multiple Vitamins-Minerals (PRESERVISION AREDS PO) Take 1 tablet by mouth daily.       Marland Kitchen oxyCODONE-acetaminophen (PERCOCET/ROXICET) 5-325 MG per tablet Take 1-2 tablets by mouth every 8 (eight) hours as needed for severe pain.  40 tablet  0  . prochlorperazine (COMPAZINE) 10 MG tablet Take 1 tablet (10 mg total) by mouth every 6 (six) hours as needed for nausea or vomiting.  60 tablet  0  . simvastatin (ZOCOR) 20 MG tablet Take 20 mg by mouth every evening.      . potassium chloride SA (K-DUR,KLOR-CON) 20 MEQ tablet Take 1 tablet (20 mEq total) by mouth 2 (two) times daily.  60 tablet  0   No current facility-administered medications for this visit.    SURGICAL HISTORY:  Past Surgical History  Procedure Laterality Date  . Cardiovascular stress test  06/29/2009    EF 79%, NO ISCHEMIA  . Tracheostomy      at age 73 or 5 d/t polyps on vocal cord  . Abdominal hysterectomy      at age 68;partial   . Colonoscopy    . Video bronchoscopy N/A 07/08/2013    Procedure: VIDEO BRONCHOSCOPY;  Surgeon: [name], MD;  Location: [facility] OR;  Service: Thoracic;  Laterality: N/A;  . Video assisted thoracoscopy (vats)/wedge resection Right 07/08/2013    Procedure: VIDEO ASSISTED THORACOSCOPY (VATS)/WEDGE RESECTION;  Surgeon: [name], MD;  Location: [place];  Service: Thoracic;  Laterality: Right;    REVIEW OF SYSTEMS:  Constitutional: negative Eyes: negative Ears, nose, mouth, throat, and face: negative Respiratory: positive for cough Cardiovascular: negative Gastrointestinal: negative Genitourinary:negative Integument/breast: negative Hematologic/lymphatic:  negative Musculoskeletal:negative Neurological: negative Behavioral/Psych: negative Endocrine: negative Allergic/Immunologic: negative   PHYSICAL EXAMINATION: General appearance: alert, cooperative, appears stated age and no distress Head: Normocephalic, without obvious abnormality, atraumatic Neck: no adenopathy, no carotid bruit, no JVD, supple, symmetrical, trachea midline and thyroid not enlarged, symmetric, no tenderness/mass/nodules Lymph nodes: Cervical, supraclavicular, and axillary nodes normal. Resp: Very few crackles in the right mid lung field otherwise the lung exam is clear to auscultation without wheezes rales or rhonchi. The right lateral incisions are all well-healed without any evidence of infection or dehiscence Back: symmetric, no curvature. ROM normal. No CVA tenderness. Cardio: regular rate and rhythm, S1, S2 normal, no murmur, click, rub or gallop GI: soft, non-tender; bowel sounds normal; no masses,  no organomegaly Extremities: extremities normal, atraumatic, no  cyanosis or edema Neurologic: Alert and oriented X 3, normal strength and tone. Normal symmetric reflexes. Normal coordination and gait  ECOG PERFORMANCE STATUS: 0 - Asymptomatic  Blood pressure 155/80, pulse 81, temperature 98.5 F (36.9 C), temperature source Oral, resp. rate 18, height $RemoveBe'5\' 4"'sWiDloHRo$  (1.626 m), weight 138 lb 1.6 oz (62.642 kg), SpO2 99.00%.  LABORATORY DATA: Lab Results  Component Value Date   WBC 4.5 09/11/2013   HGB 12.5 09/11/2013   HCT 35.9 09/11/2013   MCV 93.2 09/11/2013   PLT 283 09/11/2013      Chemistry      Component Value Date/Time   NA 134* 09/04/2013 0845   NA 134* 07/14/2013 0452   K 3.2* 09/04/2013 0845   K 3.5 07/14/2013 0452   CL 93* 07/14/2013 0452   CO2 31* 09/04/2013 0845   CO2 32 07/14/2013 0452   BUN 13.6 09/04/2013 0845   BUN 18 07/14/2013 0452   CREATININE 0.8 09/04/2013 0845   CREATININE 0.87 07/14/2013 0452      Component Value Date/Time   CALCIUM 9.7 09/04/2013 0845    CALCIUM 9.4 07/14/2013 0452   ALKPHOS 101 09/04/2013 0845   ALKPHOS 89 07/10/2013 0400   AST 20 09/04/2013 0845   AST 31 07/10/2013 0400   ALT 21 09/04/2013 0845   ALT 21 07/10/2013 0400   BILITOT 0.61 09/04/2013 0845   BILITOT 0.3 07/10/2013 0400       RADIOGRAPHIC STUDIES:  No results found.   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 She is status post right upper lobectomy under the care Dr. Servando Snare on 07/08/2013. She's currently being treated with adjuvant chemotherapy in the form of cisplatin 75 mg per meter squared and Alimta at 500 mg per meter square given every 3 weeks, status post 1 cycle. Overall she's tolerating her adjuvant chemotherapy without difficulty. Patient was discussed with and also seen by Dr. Julien Nordmann. She'll receive cycle #2 today as scheduled. She will continue with weekly labs as scheduled. She will followup in 3 weeks prior to the start of cycle #3 also with a repeat CBC differential, C. met and magnesium.   Wynetta Emery, Rodderick Holtzer E, PA-C    All questions were answered. The patient knows to call the clinic with any problems, questions or concerns. We can certainly see the patient much sooner if necessary.  ADDENDUM: Hematology/Oncology Attending: I had the face to face encounter with the patient today. I recommended her care plan. This is a very pleasant 68 years old white female with history of synchronous stage IA and stage IB non-small cell lung cancer status post right upper lobectomy with lymph node dissection. She is currently undergoing adjuvant chemotherapy with cisplatin and Alimta status post 1 cycle. She tolerated the first cycle of her treatment fairly well with no significant adverse effects. The  patient denied having any alopecia, nausea or vomiting, fever or chills. She is here to start cycle #2 of her chemotherapy I recommended for the patient to proceed with her treatment as scheduled. She would come back for followup visit in 3 weeks with the next cycle of her treatment. She was advised to call immediately if she has any concerning symptoms in the interval.  Disclaimer: This note was dictated with voice recognition software.  Similar sounding words can inadvertently be transcribed and may not be corrected upon review. Eilleen Kempf., MD 09/11/2013

## 2013-09-16 ENCOUNTER — Telehealth: Payer: Self-pay | Admitting: Internal Medicine

## 2013-09-16 NOTE — Telephone Encounter (Signed)
Talked to pt's husband and gave him appt for February and March 2015

## 2013-09-18 ENCOUNTER — Other Ambulatory Visit (HOSPITAL_BASED_OUTPATIENT_CLINIC_OR_DEPARTMENT_OTHER): Payer: Medicare Other

## 2013-09-18 DIAGNOSIS — C3410 Malignant neoplasm of upper lobe, unspecified bronchus or lung: Secondary | ICD-10-CM

## 2013-09-18 LAB — CBC WITH DIFFERENTIAL/PLATELET
BASO%: 0.8 % (ref 0.0–2.0)
BASOS ABS: 0 10*3/uL (ref 0.0–0.1)
EOS%: 0.5 % (ref 0.0–7.0)
Eosinophils Absolute: 0 10*3/uL (ref 0.0–0.5)
HCT: 36.7 % (ref 34.8–46.6)
HGB: 12.7 g/dL (ref 11.6–15.9)
LYMPH#: 2 10*3/uL (ref 0.9–3.3)
LYMPH%: 53.5 % — ABNORMAL HIGH (ref 14.0–49.7)
MCH: 32.8 pg (ref 25.1–34.0)
MCHC: 34.6 g/dL (ref 31.5–36.0)
MCV: 94.7 fL (ref 79.5–101.0)
MONO#: 0.2 10*3/uL (ref 0.1–0.9)
MONO%: 5.3 % (ref 0.0–14.0)
NEUT%: 39.9 % (ref 38.4–76.8)
NEUTROS ABS: 1.5 10*3/uL (ref 1.5–6.5)
Platelets: 204 10*3/uL (ref 145–400)
RBC: 3.88 10*6/uL (ref 3.70–5.45)
RDW: 14 % (ref 11.2–14.5)
WBC: 3.8 10*3/uL — ABNORMAL LOW (ref 3.9–10.3)

## 2013-09-18 LAB — COMPREHENSIVE METABOLIC PANEL (CC13)
ALK PHOS: 93 U/L (ref 40–150)
ALT: 21 U/L (ref 0–55)
AST: 22 U/L (ref 5–34)
Albumin: 4.3 g/dL (ref 3.5–5.0)
Anion Gap: 11 mEq/L (ref 3–11)
BUN: 25.6 mg/dL (ref 7.0–26.0)
CALCIUM: 10 mg/dL (ref 8.4–10.4)
CHLORIDE: 93 meq/L — AB (ref 98–109)
CO2: 30 mEq/L — ABNORMAL HIGH (ref 22–29)
Creatinine: 0.9 mg/dL (ref 0.6–1.1)
Glucose: 94 mg/dl (ref 70–140)
POTASSIUM: 3.7 meq/L (ref 3.5–5.1)
SODIUM: 133 meq/L — AB (ref 136–145)
Total Bilirubin: 0.53 mg/dL (ref 0.20–1.20)
Total Protein: 6.5 g/dL (ref 6.4–8.3)

## 2013-09-18 LAB — MAGNESIUM (CC13): Magnesium: 1.7 mg/dl (ref 1.5–2.5)

## 2013-09-25 ENCOUNTER — Other Ambulatory Visit (HOSPITAL_BASED_OUTPATIENT_CLINIC_OR_DEPARTMENT_OTHER): Payer: Medicare Other

## 2013-09-25 DIAGNOSIS — C3410 Malignant neoplasm of upper lobe, unspecified bronchus or lung: Secondary | ICD-10-CM

## 2013-09-25 LAB — CBC WITH DIFFERENTIAL/PLATELET
BASO%: 0.3 % (ref 0.0–2.0)
Basophils Absolute: 0 10*3/uL (ref 0.0–0.1)
EOS%: 1.3 % (ref 0.0–7.0)
Eosinophils Absolute: 0.1 10*3/uL (ref 0.0–0.5)
HCT: 35.8 % (ref 34.8–46.6)
HGB: 12.3 g/dL (ref 11.6–15.9)
LYMPH%: 48.3 % (ref 14.0–49.7)
MCH: 32.8 pg (ref 25.1–34.0)
MCHC: 34.4 g/dL (ref 31.5–36.0)
MCV: 95.4 fL (ref 79.5–101.0)
MONO#: 0.4 10*3/uL (ref 0.1–0.9)
MONO%: 11 % (ref 0.0–14.0)
NEUT#: 1.5 10*3/uL (ref 1.5–6.5)
NEUT%: 39.1 % (ref 38.4–76.8)
PLATELETS: 193 10*3/uL (ref 145–400)
RBC: 3.75 10*6/uL (ref 3.70–5.45)
RDW: 14.4 % (ref 11.2–14.5)
WBC: 3.8 10*3/uL — AB (ref 3.9–10.3)
lymph#: 1.8 10*3/uL (ref 0.9–3.3)

## 2013-09-25 LAB — COMPREHENSIVE METABOLIC PANEL (CC13)
ALT: 14 U/L (ref 0–55)
ANION GAP: 12 meq/L — AB (ref 3–11)
AST: 20 U/L (ref 5–34)
Albumin: 4.2 g/dL (ref 3.5–5.0)
Alkaline Phosphatase: 95 U/L (ref 40–150)
BILIRUBIN TOTAL: 0.51 mg/dL (ref 0.20–1.20)
BUN: 16.6 mg/dL (ref 7.0–26.0)
CO2: 29 meq/L (ref 22–29)
CREATININE: 0.9 mg/dL (ref 0.6–1.1)
Calcium: 10.1 mg/dL (ref 8.4–10.4)
Chloride: 96 mEq/L — ABNORMAL LOW (ref 98–109)
Glucose: 103 mg/dl (ref 70–140)
Potassium: 3.5 mEq/L (ref 3.5–5.1)
SODIUM: 137 meq/L (ref 136–145)
Total Protein: 6.5 g/dL (ref 6.4–8.3)

## 2013-09-25 LAB — MAGNESIUM (CC13): Magnesium: 1.8 mg/dl (ref 1.5–2.5)

## 2013-09-26 ENCOUNTER — Telehealth: Payer: Self-pay | Admitting: *Deleted

## 2013-09-26 NOTE — Telephone Encounter (Signed)
Pt requesting letter to excuse her from jury duty, letter written, signed by Dr Vista Mink and left at the front desk for pick up.  Copy will be scanned into EPIC.  SLJ

## 2013-10-02 ENCOUNTER — Ambulatory Visit (HOSPITAL_BASED_OUTPATIENT_CLINIC_OR_DEPARTMENT_OTHER): Payer: Medicare Other

## 2013-10-02 ENCOUNTER — Other Ambulatory Visit (HOSPITAL_BASED_OUTPATIENT_CLINIC_OR_DEPARTMENT_OTHER): Payer: Medicare Other

## 2013-10-02 ENCOUNTER — Telehealth: Payer: Self-pay | Admitting: Internal Medicine

## 2013-10-02 ENCOUNTER — Encounter: Payer: Self-pay | Admitting: Internal Medicine

## 2013-10-02 ENCOUNTER — Ambulatory Visit (HOSPITAL_BASED_OUTPATIENT_CLINIC_OR_DEPARTMENT_OTHER): Payer: Medicare Other | Admitting: Internal Medicine

## 2013-10-02 ENCOUNTER — Encounter (INDEPENDENT_AMBULATORY_CARE_PROVIDER_SITE_OTHER): Payer: Self-pay

## 2013-10-02 VITALS — BP 153/71 | HR 78 | Temp 98.5°F | Resp 20 | Ht 64.0 in | Wt 137.9 lb

## 2013-10-02 DIAGNOSIS — C3410 Malignant neoplasm of upper lobe, unspecified bronchus or lung: Secondary | ICD-10-CM

## 2013-10-02 DIAGNOSIS — J984 Other disorders of lung: Secondary | ICD-10-CM

## 2013-10-02 DIAGNOSIS — Z5111 Encounter for antineoplastic chemotherapy: Secondary | ICD-10-CM

## 2013-10-02 LAB — COMPREHENSIVE METABOLIC PANEL (CC13)
ALK PHOS: 97 U/L (ref 40–150)
ALT: 14 U/L (ref 0–55)
ANION GAP: 11 meq/L (ref 3–11)
AST: 16 U/L (ref 5–34)
Albumin: 4 g/dL (ref 3.5–5.0)
BILIRUBIN TOTAL: 0.65 mg/dL (ref 0.20–1.20)
BUN: 14.7 mg/dL (ref 7.0–26.0)
CO2: 25 mEq/L (ref 22–29)
Calcium: 9.1 mg/dL (ref 8.4–10.4)
Chloride: 97 mEq/L — ABNORMAL LOW (ref 98–109)
Creatinine: 0.8 mg/dL (ref 0.6–1.1)
Glucose: 159 mg/dl — ABNORMAL HIGH (ref 70–140)
Potassium: 3.1 mEq/L — ABNORMAL LOW (ref 3.5–5.1)
SODIUM: 133 meq/L — AB (ref 136–145)
TOTAL PROTEIN: 6.6 g/dL (ref 6.4–8.3)

## 2013-10-02 LAB — CBC WITH DIFFERENTIAL/PLATELET
BASO%: 0 % (ref 0.0–2.0)
BASOS ABS: 0 10*3/uL (ref 0.0–0.1)
EOS%: 0 % (ref 0.0–7.0)
Eosinophils Absolute: 0 10*3/uL (ref 0.0–0.5)
HCT: 34.6 % — ABNORMAL LOW (ref 34.8–46.6)
HGB: 12.3 g/dL (ref 11.6–15.9)
LYMPH%: 23.4 % (ref 14.0–49.7)
MCH: 32.5 pg (ref 25.1–34.0)
MCHC: 35.5 g/dL (ref 31.5–36.0)
MCV: 91.5 fL (ref 79.5–101.0)
MONO#: 0.3 10*3/uL (ref 0.1–0.9)
MONO%: 7.8 % (ref 0.0–14.0)
NEUT%: 68.8 % (ref 38.4–76.8)
NEUTROS ABS: 2.3 10*3/uL (ref 1.5–6.5)
PLATELETS: 207 10*3/uL (ref 145–400)
RBC: 3.78 10*6/uL (ref 3.70–5.45)
RDW: 14.9 % — AB (ref 11.2–14.5)
WBC: 3.3 10*3/uL — ABNORMAL LOW (ref 3.9–10.3)
lymph#: 0.8 10*3/uL — ABNORMAL LOW (ref 0.9–3.3)
nRBC: 0 % (ref 0–0)

## 2013-10-02 LAB — MAGNESIUM (CC13): Magnesium: 2 mg/dl (ref 1.5–2.5)

## 2013-10-02 MED ORDER — CYANOCOBALAMIN 1000 MCG/ML IJ SOLN
1000.0000 ug | Freq: Once | INTRAMUSCULAR | Status: AC
  Administered 2013-10-02: 1000 ug via INTRAMUSCULAR

## 2013-10-02 MED ORDER — SODIUM CHLORIDE 0.9 % IV SOLN
150.0000 mg | Freq: Once | INTRAVENOUS | Status: AC
  Administered 2013-10-02: 150 mg via INTRAVENOUS
  Filled 2013-10-02: qty 5

## 2013-10-02 MED ORDER — CYANOCOBALAMIN 1000 MCG/ML IJ SOLN
INTRAMUSCULAR | Status: AC
  Filled 2013-10-02: qty 1

## 2013-10-02 MED ORDER — HEPARIN SOD (PORK) LOCK FLUSH 100 UNIT/ML IV SOLN
500.0000 [IU] | Freq: Once | INTRAVENOUS | Status: DC | PRN
  Filled 2013-10-02: qty 5

## 2013-10-02 MED ORDER — POTASSIUM CHLORIDE 2 MEQ/ML IV SOLN
Freq: Once | INTRAVENOUS | Status: AC
  Administered 2013-10-02: 10:00:00 via INTRAVENOUS
  Filled 2013-10-02: qty 10

## 2013-10-02 MED ORDER — PALONOSETRON HCL INJECTION 0.25 MG/5ML
0.2500 mg | Freq: Once | INTRAVENOUS | Status: AC
  Administered 2013-10-02: 0.25 mg via INTRAVENOUS

## 2013-10-02 MED ORDER — DEXAMETHASONE SODIUM PHOSPHATE 20 MG/5ML IJ SOLN
12.0000 mg | Freq: Once | INTRAMUSCULAR | Status: AC
  Administered 2013-10-02: 12 mg via INTRAVENOUS

## 2013-10-02 MED ORDER — SODIUM CHLORIDE 0.9 % IV SOLN
Freq: Once | INTRAVENOUS | Status: AC
  Administered 2013-10-02: 12:00:00 via INTRAVENOUS

## 2013-10-02 MED ORDER — SODIUM CHLORIDE 0.9 % IV SOLN
500.0000 mg/m2 | Freq: Once | INTRAVENOUS | Status: AC
  Administered 2013-10-02: 825 mg via INTRAVENOUS
  Filled 2013-10-02: qty 33

## 2013-10-02 MED ORDER — DEXAMETHASONE SODIUM PHOSPHATE 20 MG/5ML IJ SOLN
INTRAMUSCULAR | Status: AC
  Filled 2013-10-02: qty 5

## 2013-10-02 MED ORDER — SODIUM CHLORIDE 0.9 % IJ SOLN
10.0000 mL | INTRAMUSCULAR | Status: DC | PRN
  Filled 2013-10-02: qty 10

## 2013-10-02 MED ORDER — PALONOSETRON HCL INJECTION 0.25 MG/5ML
INTRAVENOUS | Status: AC
  Filled 2013-10-02: qty 5

## 2013-10-02 MED ORDER — CISPLATIN CHEMO INJECTION 100MG/100ML
75.0000 mg/m2 | Freq: Once | INTRAVENOUS | Status: AC
  Administered 2013-10-02: 120 mg via INTRAVENOUS
  Filled 2013-10-02: qty 120

## 2013-10-02 NOTE — Patient Instructions (Signed)
Followup visit in 3 weeks with the next cycle of her chemotherapy.

## 2013-10-02 NOTE — Patient Instructions (Signed)
[facility] Discharge Instructions for Patients Receiving Chemotherapy  Today you received the following chemotherapy agents alimta/cisplatin.   To help prevent nausea and vomiting after your treatment, we encourage you to take your nausea medication as directed.    If you develop nausea and vomiting that is not controlled by your nausea medication, call the clinic.   BELOW ARE SYMPTOMS THAT SHOULD BE REPORTED IMMEDIATELY:  *FEVER GREATER THAN 100.5 F  *CHILLS WITH OR WITHOUT FEVER  NAUSEA AND VOMITING THAT IS NOT CONTROLLED WITH YOUR NAUSEA MEDICATION  *UNUSUAL SHORTNESS OF BREATH  *UNUSUAL BRUISING OR BLEEDING  TENDERNESS IN MOUTH AND THROAT WITH OR WITHOUT PRESENCE OF ULCERS  *URINARY PROBLEMS  *BOWEL PROBLEMS  UNUSUAL RASH Items with * indicate a potential emergency and should be followed up as soon as possible.  Feel free to call the clinic you have any questions or concerns. The clinic phone number is [phone].

## 2013-10-02 NOTE — Progress Notes (Signed)
Quick Note:  Call patient with the result and order K Dur 20 meq po qd X 7 days ______

## 2013-10-02 NOTE — Progress Notes (Signed)
[facility] [phone]   [phone]  OFFICE PROGRESS NOTE  [name], [address], [place] [address]  DIAGNOSIS: Cancer of upper lobe of lung  Primary site: Lung (Right)  Staging method: AJCC 7th Edition  Clinical: (T1a, N0)  Pathologic: Stage IA (T1a, N0, cM0) signed by [name], MD on 07/09/2013 1:58 PM  Summary: Stage IA (T1a, N0, cM0)  Lung cancer, Right upper lobe  Primary site: Lung (Right)  Pathologic: Stage IB (T2a, N0, cM0) signed by [name], MD on 07/09/2013 1:54 PM  Summary: Stage IB (T2a, N0, cM0)   PRIOR THERAPY: Status post right video-assisted thoracoscopy, wedge resection, right upper lobe with completion of right upper lobectomy with lymph node dissection under the care of Dr. Servando Snare on 07/08/2013   CURRENT THERAPY: Adjuvant chemotherapy with systemic chemotherapy in the form of cisplatin at 75 mg per meter squared and Alimta 500 mg per meter squared given every 3 weeks for a total of 4 cycles, status post 2 cycles.   CHEMOTHERAPY INTENT: Curative  CURRENT # OF CHEMOTHERAPY CYCLES: 3  CURRENT ANTIEMETICS: Aloxi, dexamethasone, Compazine  CURRENT SMOKING STATUS: Former smoker, quit 08/08/1978  ORAL CHEMOTHERAPY AND CONSENT: n/a  CURRENT BISPHOSPHONATES USE: none  PAIN MANAGEMENT: Percocent  NARCOTICS INDUCED CONSTIPATION: none  LIVING WILL AND CODE STATUS: ?    INTERVAL HISTORY: [name] 68 y.o. female returns to the clinic today for followup visit accompanied by her husband. The patient is feeling fine today with no specific complaints. She tolerated the second cycle of her systemic chemotherapy with cisplatin and Alimta fairly well with no significant adverse effect except for occasional insomnia. The patient denied having any nausea or vomiting. She has no fever or chills. She has no significant chest pain, shortness breath, cough or hemoptysis. She is here today to  start cycle #4 of her adjuvant chemotherapy.  MEDICAL HISTORY: Past Medical History  Diagnosis Date  . Hyperlipidemia     takes Zocor daily  . Cavitating mass in right upper lung lobe   . Lung nodules     right upper lobe  . GERD (gastroesophageal reflux disease)     takes Nexium daily  . Hypertension     takes Amlodipine daily and Chlorthalidone as well  . Dizziness     occasionally   . Arthritis     back   . History of colon polyps   . Diverticulosis   . Family history of kidney infection     pt history-saw Dr.Wrenn  . Nocturia   . Lung cancer, Right upper lobe     ALLERGIES:  has No Known Allergies.  MEDICATIONS:  Current Outpatient Prescriptions  Medication Sig Dispense Refill  . amLODipine (NORVASC) 5 MG tablet Take 5 mg by mouth daily.      . chlorthalidone (HYGROTON) 25 MG tablet Take 25 mg by mouth daily.      . Cholecalciferol (VITAMIN D-3 PO) Take 1 tablet by mouth 2 (two) times a week.       Marland Kitchen dexamethasone (DECADRON) 4 MG tablet 4 Milligram by mouth twice a day the day before, day of and day after the chemotherapy every 3 weeks.  30 tablet  0  . esomeprazole (NEXIUM) 40 MG capsule Take 40 mg by mouth daily before breakfast.      . folic acid (FOLVITE) 1 MG tablet Take 1 tablet (1 mg total) by mouth daily.  [place]  tablet  2  . Multiple Vitamins-Minerals (PRESERVISION AREDS PO) Take 1 tablet by mouth daily.       Marland Kitchen oxyCODONE-acetaminophen (PERCOCET/ROXICET) 5-325 MG per tablet Take 1-2 tablets by mouth every 8 (eight) hours as needed for severe pain.  40 tablet  0  . prochlorperazine (COMPAZINE) 10 MG tablet Take 1 tablet (10 mg total) by mouth every 6 (six) hours as needed for nausea or vomiting.  60 tablet  0  . simvastatin (ZOCOR) 20 MG tablet Take 20 mg by mouth every evening.       No current facility-administered medications for this visit.    SURGICAL HISTORY:  Past Surgical History  Procedure Laterality Date  . Cardiovascular stress test  06/29/2009     EF 79%, NO ISCHEMIA  . Tracheostomy      at age 52 or 6 d/t polyps on vocal cord  . Abdominal hysterectomy      at age 65;partial   . Colonoscopy    . Video bronchoscopy N/A 07/08/2013    Procedure: VIDEO BRONCHOSCOPY;  Surgeon: [name], MD;  Location: [facility] OR;  Service: Thoracic;  Laterality: N/A;  . Video assisted thoracoscopy (vats)/wedge resection Right 07/08/2013    Procedure: VIDEO ASSISTED THORACOSCOPY (VATS)/WEDGE RESECTION;  Surgeon: [name], MD;  Location: [place];  Service: Thoracic;  Laterality: Right;    REVIEW OF SYSTEMS:  Constitutional: negative Eyes: negative Ears, nose, mouth, throat, and face: negative Respiratory: negative Cardiovascular: negative Gastrointestinal: negative Genitourinary:negative Integument/breast: negative Hematologic/lymphatic: negative Musculoskeletal:negative Neurological: negative Behavioral/Psych: positive for sleep disturbance Endocrine: negative Allergic/Immunologic: negative   PHYSICAL EXAMINATION: General appearance: alert, cooperative and no distress Head: Normocephalic, without obvious abnormality, atraumatic Neck: no adenopathy, no JVD, supple, symmetrical, trachea midline and thyroid not enlarged, symmetric, no tenderness/mass/nodules Lymph nodes: Cervical, supraclavicular, and axillary nodes normal. Resp: clear to auscultation bilaterally Back: symmetric, no curvature. ROM normal. No CVA tenderness. Cardio: regular rate and rhythm, S1, S2 normal, no murmur, click, rub or gallop GI: soft, non-tender; bowel sounds normal; no masses,  no organomegaly Extremities: extremities normal, atraumatic, no cyanosis or edema  ECOG PERFORMANCE STATUS: 1 - Symptomatic but completely ambulatory  Blood pressure 153/71, pulse 78, temperature 98.5 F (36.9 C), temperature source Oral, resp. rate 20, height 5\' 4"  (1.626 m), weight 137 lb 14.4 oz (62.551 kg).  LABORATORY DATA: Lab Results  Component Value Date   WBC 3.3*  10/02/2013   HGB 12.3 10/02/2013   HCT 34.6* 10/02/2013   MCV 91.5 10/02/2013   PLT 207 10/02/2013      Chemistry      Component Value Date/Time   NA 137 09/25/2013 0832   NA 134* 07/14/2013 0452   K 3.5 09/25/2013 0832   K 3.5 07/14/2013 0452   CL 93* 07/14/2013 0452   CO2 29 09/25/2013 0832   CO2 32 07/14/2013 0452   BUN 16.6 09/25/2013 0832   BUN 18 07/14/2013 0452   CREATININE 0.9 09/25/2013 0832   CREATININE 0.87 07/14/2013 0452      Component Value Date/Time   CALCIUM 10.1 09/25/2013 0832   CALCIUM 9.4 07/14/2013 0452   ALKPHOS 95 09/25/2013 0832   ALKPHOS 89 07/10/2013 0400   AST 20 09/25/2013 0832   AST 31 07/10/2013 0400   ALT 14 09/25/2013 0832   ALT 21 07/10/2013 0400   BILITOT 0.51 09/25/2013 0832   BILITOT 0.3 07/10/2013 0400       RADIOGRAPHIC STUDIES: No results found.  ASSESSMENT AND PLAN: This is a  very pleasant 68 years old white female with synchronous non-small cell lung cancer presenting with stage IA as well as a stage IB status post right upper lobectomy with lymph node dissection and currently undergoing adjuvant chemotherapy with cisplatin and Alimta status post 2 cycles. The patient is tolerating her treatment fairly well with no significant adverse effects. I recommended for her to proceed with cycle #3 today as scheduled. The patient would come back for followup visit in 3 weeks with the start of cycle #4. She was advised to call immediately she has any concerning symptoms in the interval. The patient voices understanding of current disease status and treatment options and is in agreement with the current care plan.  All questions were answered. The patient knows to call the clinic with any problems, questions or concerns. We can certainly see the patient much sooner if necessary.  Disclaimer: This note was dictated with voice recognition software. Similar sounding words can inadvertently be transcribed and may not be corrected upon review.

## 2013-10-02 NOTE — Telephone Encounter (Signed)
gv and printed aptp sched and avs for pt for March....sed added tx.

## 2013-10-03 ENCOUNTER — Telehealth: Payer: Self-pay | Admitting: Medical Oncology

## 2013-10-03 DIAGNOSIS — E876 Hypokalemia: Secondary | ICD-10-CM

## 2013-10-03 MED ORDER — POTASSIUM CHLORIDE CRYS ER 20 MEQ PO TBCR: 20.0000 meq | EXTENDED_RELEASE_TABLET | Freq: Every day | ORAL | Status: DC

## 2013-10-03 NOTE — Telephone Encounter (Signed)
Called to pt and and pharmacy.

## 2013-10-03 NOTE — Addendum Note (Signed)
Addended by: Ardeen Garland on: 10/03/2013 12:59 PM   Modules accepted: Orders

## 2013-10-03 NOTE — Telephone Encounter (Signed)
Message copied by Ardeen Garland on Thu Oct 03, 2013 12:50 PM ------      Message from: Curt Bears      Created: Wed Oct 02, 2013  6:43 PM       Call patient with the result and order K Dur 20 meq po qd X 7 days ------

## 2013-10-03 NOTE — Telephone Encounter (Signed)
Message copied by Ardeen Garland on Thu Oct 03, 2013 12:52 PM ------      Message from: Curt Bears      Created: Wed Oct 02, 2013  6:43 PM       Call patient with the result and order K Dur 20 meq po qd X 7 days ------

## 2013-10-09 ENCOUNTER — Other Ambulatory Visit (HOSPITAL_BASED_OUTPATIENT_CLINIC_OR_DEPARTMENT_OTHER): Payer: Medicare Other

## 2013-10-09 DIAGNOSIS — E43 Unspecified severe protein-calorie malnutrition: Secondary | ICD-10-CM

## 2013-10-09 DIAGNOSIS — C3410 Malignant neoplasm of upper lobe, unspecified bronchus or lung: Secondary | ICD-10-CM

## 2013-10-09 LAB — COMPREHENSIVE METABOLIC PANEL (CC13)
ALT: 22 U/L (ref 0–55)
AST: 23 U/L (ref 5–34)
Albumin: 4.3 g/dL (ref 3.5–5.0)
Alkaline Phosphatase: 96 U/L (ref 40–150)
Anion Gap: 9 mEq/L (ref 3–11)
BILIRUBIN TOTAL: 0.29 mg/dL (ref 0.20–1.20)
BUN: 27.5 mg/dL — ABNORMAL HIGH (ref 7.0–26.0)
CO2: 27 mEq/L (ref 22–29)
CREATININE: 1.1 mg/dL (ref 0.6–1.1)
Calcium: 10.1 mg/dL (ref 8.4–10.4)
Chloride: 98 mEq/L (ref 98–109)
Glucose: 99 mg/dl (ref 70–140)
Potassium: 5 mEq/L (ref 3.5–5.1)
Sodium: 134 mEq/L — ABNORMAL LOW (ref 136–145)
Total Protein: 6.9 g/dL (ref 6.4–8.3)

## 2013-10-09 LAB — CBC WITH DIFFERENTIAL/PLATELET
BASO%: 0.6 % (ref 0.0–2.0)
Basophils Absolute: 0 10*3/uL (ref 0.0–0.1)
EOS%: 0.3 % (ref 0.0–7.0)
Eosinophils Absolute: 0 10*3/uL (ref 0.0–0.5)
HCT: 36.3 % (ref 34.8–46.6)
HGB: 12.5 g/dL (ref 11.6–15.9)
LYMPH%: 48.2 % (ref 14.0–49.7)
MCH: 33.2 pg (ref 25.1–34.0)
MCHC: 34.4 g/dL (ref 31.5–36.0)
MCV: 96.3 fL (ref 79.5–101.0)
MONO#: 0.3 10*3/uL (ref 0.1–0.9)
MONO%: 7.4 % (ref 0.0–14.0)
NEUT#: 1.5 10*3/uL (ref 1.5–6.5)
NEUT%: 43.5 % (ref 38.4–76.8)
PLATELETS: 156 10*3/uL (ref 145–400)
RBC: 3.77 10*6/uL (ref 3.70–5.45)
RDW: 16 % — ABNORMAL HIGH (ref 11.2–14.5)
WBC: 3.4 10*3/uL — ABNORMAL LOW (ref 3.9–10.3)
lymph#: 1.7 10*3/uL (ref 0.9–3.3)

## 2013-10-09 LAB — MAGNESIUM (CC13): MAGNESIUM: 1.5 mg/dL (ref 1.5–2.5)

## 2013-10-16 ENCOUNTER — Other Ambulatory Visit (HOSPITAL_BASED_OUTPATIENT_CLINIC_OR_DEPARTMENT_OTHER): Payer: Medicare Other

## 2013-10-16 DIAGNOSIS — C3410 Malignant neoplasm of upper lobe, unspecified bronchus or lung: Secondary | ICD-10-CM

## 2013-10-16 LAB — CBC WITH DIFFERENTIAL/PLATELET
BASO%: 0.2 % (ref 0.0–2.0)
BASOS ABS: 0 10*3/uL (ref 0.0–0.1)
EOS ABS: 0 10*3/uL (ref 0.0–0.5)
EOS%: 0.5 % (ref 0.0–7.0)
HCT: 33.6 % — ABNORMAL LOW (ref 34.8–46.6)
HEMOGLOBIN: 12 g/dL (ref 11.6–15.9)
LYMPH%: 33.9 % (ref 14.0–49.7)
MCH: 33.8 pg (ref 25.1–34.0)
MCHC: 35.7 g/dL (ref 31.5–36.0)
MCV: 94.6 fL (ref 79.5–101.0)
MONO#: 0.4 10*3/uL (ref 0.1–0.9)
MONO%: 7.7 % (ref 0.0–14.0)
NEUT%: 57.7 % (ref 38.4–76.8)
NEUTROS ABS: 3.1 10*3/uL (ref 1.5–6.5)
Platelets: 194 10*3/uL (ref 145–400)
RBC: 3.55 10*6/uL — ABNORMAL LOW (ref 3.70–5.45)
RDW: 15.9 % — AB (ref 11.2–14.5)
WBC: 5.3 10*3/uL (ref 3.9–10.3)
lymph#: 1.8 10*3/uL (ref 0.9–3.3)

## 2013-10-16 LAB — COMPREHENSIVE METABOLIC PANEL (CC13)
ALBUMIN: 4.2 g/dL (ref 3.5–5.0)
ALT: 13 U/L (ref 0–55)
AST: 19 U/L (ref 5–34)
Alkaline Phosphatase: 106 U/L (ref 40–150)
Anion Gap: 11 mEq/L (ref 3–11)
BUN: 21 mg/dL (ref 7.0–26.0)
CHLORIDE: 94 meq/L — AB (ref 98–109)
CO2: 28 meq/L (ref 22–29)
Calcium: 9.8 mg/dL (ref 8.4–10.4)
Creatinine: 1 mg/dL (ref 0.6–1.1)
GLUCOSE: 94 mg/dL (ref 70–140)
Potassium: 3.6 mEq/L (ref 3.5–5.1)
Sodium: 133 mEq/L — ABNORMAL LOW (ref 136–145)
Total Bilirubin: 0.28 mg/dL (ref 0.20–1.20)
Total Protein: 6.8 g/dL (ref 6.4–8.3)

## 2013-10-16 LAB — MAGNESIUM (CC13): MAGNESIUM: 1.7 mg/dL (ref 1.5–2.5)

## 2013-10-23 ENCOUNTER — Other Ambulatory Visit (HOSPITAL_BASED_OUTPATIENT_CLINIC_OR_DEPARTMENT_OTHER): Payer: Medicare Other

## 2013-10-23 ENCOUNTER — Encounter: Payer: Self-pay | Admitting: Physician Assistant

## 2013-10-23 ENCOUNTER — Ambulatory Visit (HOSPITAL_BASED_OUTPATIENT_CLINIC_OR_DEPARTMENT_OTHER): Payer: Medicare Other | Admitting: Physician Assistant

## 2013-10-23 ENCOUNTER — Other Ambulatory Visit: Payer: Medicare Other

## 2013-10-23 ENCOUNTER — Ambulatory Visit (HOSPITAL_BASED_OUTPATIENT_CLINIC_OR_DEPARTMENT_OTHER): Payer: Medicare Other

## 2013-10-23 ENCOUNTER — Ambulatory Visit: Payer: Medicare Other

## 2013-10-23 VITALS — BP 160/79 | HR 77 | Temp 97.8°F | Resp 18 | Ht 64.0 in | Wt 136.5 lb

## 2013-10-23 DIAGNOSIS — Z5111 Encounter for antineoplastic chemotherapy: Secondary | ICD-10-CM

## 2013-10-23 DIAGNOSIS — G8918 Other acute postprocedural pain: Secondary | ICD-10-CM

## 2013-10-23 DIAGNOSIS — J984 Other disorders of lung: Secondary | ICD-10-CM

## 2013-10-23 DIAGNOSIS — C3410 Malignant neoplasm of upper lobe, unspecified bronchus or lung: Secondary | ICD-10-CM

## 2013-10-23 LAB — CBC WITH DIFFERENTIAL/PLATELET
BASO%: 0.3 % (ref 0.0–2.0)
Basophils Absolute: 0 10*3/uL (ref 0.0–0.1)
EOS%: 0 % (ref 0.0–7.0)
Eosinophils Absolute: 0 10*3/uL (ref 0.0–0.5)
HCT: 35.8 % (ref 34.8–46.6)
HGB: 12.4 g/dL (ref 11.6–15.9)
LYMPH#: 0.8 10*3/uL — AB (ref 0.9–3.3)
LYMPH%: 16.6 % (ref 14.0–49.7)
MCH: 33.4 pg (ref 25.1–34.0)
MCHC: 34.7 g/dL (ref 31.5–36.0)
MCV: 96 fL (ref 79.5–101.0)
MONO#: 0.5 10*3/uL (ref 0.1–0.9)
MONO%: 10.2 % (ref 0.0–14.0)
NEUT#: 3.7 10*3/uL (ref 1.5–6.5)
NEUT%: 72.9 % (ref 38.4–76.8)
PLATELETS: 270 10*3/uL (ref 145–400)
RBC: 3.73 10*6/uL (ref 3.70–5.45)
RDW: 16.7 % — ABNORMAL HIGH (ref 11.2–14.5)
WBC: 5.1 10*3/uL (ref 3.9–10.3)

## 2013-10-23 LAB — COMPREHENSIVE METABOLIC PANEL (CC13)
ALK PHOS: 104 U/L (ref 40–150)
ALT: 13 U/L (ref 0–55)
AST: 17 U/L (ref 5–34)
Albumin: 4.7 g/dL (ref 3.5–5.0)
Anion Gap: 15 mEq/L — ABNORMAL HIGH (ref 3–11)
BILIRUBIN TOTAL: 0.23 mg/dL (ref 0.20–1.20)
BUN: 21.9 mg/dL (ref 7.0–26.0)
CO2: 26 mEq/L (ref 22–29)
Calcium: 10.7 mg/dL — ABNORMAL HIGH (ref 8.4–10.4)
Chloride: 96 mEq/L — ABNORMAL LOW (ref 98–109)
Creatinine: 1.1 mg/dL (ref 0.6–1.1)
Glucose: 134 mg/dl (ref 70–140)
POTASSIUM: 3.7 meq/L (ref 3.5–5.1)
SODIUM: 137 meq/L (ref 136–145)
Total Protein: 7.6 g/dL (ref 6.4–8.3)

## 2013-10-23 LAB — MAGNESIUM (CC13): Magnesium: 2 mg/dl (ref 1.5–2.5)

## 2013-10-23 MED ORDER — SODIUM CHLORIDE 0.9 % IV SOLN
75.0000 mg/m2 | Freq: Once | INTRAVENOUS | Status: AC
  Administered 2013-10-23: 120 mg via INTRAVENOUS
  Filled 2013-10-23: qty 120

## 2013-10-23 MED ORDER — POTASSIUM CHLORIDE 2 MEQ/ML IV SOLN
Freq: Once | INTRAVENOUS | Status: AC
  Administered 2013-10-23: 11:00:00 via INTRAVENOUS
  Filled 2013-10-23: qty 10

## 2013-10-23 MED ORDER — OXYCODONE-ACETAMINOPHEN 5-325 MG PO TABS: 1.0000 | ORAL_TABLET | Freq: Three times a day (TID) | ORAL | Status: DC | PRN

## 2013-10-23 MED ORDER — SODIUM CHLORIDE 0.9 % IV SOLN
150.0000 mg | Freq: Once | INTRAVENOUS | Status: AC
  Administered 2013-10-23: 150 mg via INTRAVENOUS
  Filled 2013-10-23: qty 5

## 2013-10-23 MED ORDER — PALONOSETRON HCL INJECTION 0.25 MG/5ML
0.2500 mg | Freq: Once | INTRAVENOUS | Status: AC
  Administered 2013-10-23: 0.25 mg via INTRAVENOUS

## 2013-10-23 MED ORDER — DEXAMETHASONE SODIUM PHOSPHATE 20 MG/5ML IJ SOLN
INTRAMUSCULAR | Status: AC
  Filled 2013-10-23: qty 5

## 2013-10-23 MED ORDER — DEXAMETHASONE SODIUM PHOSPHATE 20 MG/5ML IJ SOLN
12.0000 mg | Freq: Once | INTRAMUSCULAR | Status: AC
  Administered 2013-10-23: 12 mg via INTRAVENOUS

## 2013-10-23 MED ORDER — SODIUM CHLORIDE 0.9 % IV SOLN
500.0000 mg/m2 | Freq: Once | INTRAVENOUS | Status: AC
  Administered 2013-10-23: 825 mg via INTRAVENOUS
  Filled 2013-10-23: qty 33

## 2013-10-23 MED ORDER — SODIUM CHLORIDE 0.9 % IV SOLN
Freq: Once | INTRAVENOUS | Status: AC
  Administered 2013-10-23: 11:00:00 via INTRAVENOUS

## 2013-10-23 MED ORDER — PALONOSETRON HCL INJECTION 0.25 MG/5ML
INTRAVENOUS | Status: AC
Start: 2013-10-23 — End: 2013-10-23
  Filled 2013-10-23: qty 5

## 2013-10-23 NOTE — Progress Notes (Signed)
[facility] [phone]   Fax:(336) [phone]  SHARED VISIT PROGRESS NOTE  ROBERTS, Sharol Given, MD [address], [place] [address]  DIAGNOSIS: Cancer of upper lobe of lung  Primary site: Lung (Right)  Staging method: AJCC 7th Edition  Clinical: (T1a, N0)  Pathologic: Stage IA (T1a, N0, cM0) signed by [name], MD on 07/09/2013 1:58 PM  Summary: Stage IA (T1a, N0, cM0)  Lung cancer, Right upper lobe  Primary site: Lung (Right)  Pathologic: Stage IB (T2a, N0, cM0) signed by [name], MD on 07/09/2013 1:54 PM  Summary: Stage IB (T2a, N0, cM0)   PRIOR THERAPY: Status post right video-assisted thoracoscopy, wedge resection, right upper lobe with completion of right upper lobectomy with lymph node dissection under the care of Dr. Servando Snare on 07/08/2013   CURRENT THERAPY: Adjuvant chemotherapy with systemic chemotherapy in the form of cisplatin at 75 mg per meter squared and Alimta 500 mg per meter squared given every 3 weeks for a total of 4 cycles, status post 3 cycles.   CHEMOTHERAPY INTENT: Curative  CURRENT # OF CHEMOTHERAPY CYCLES: 3  CURRENT ANTIEMETICS: Aloxi, dexamethasone, Compazine  CURRENT SMOKING STATUS: Former smoker, quit 08/08/1978  ORAL CHEMOTHERAPY AND CONSENT: n/a  CURRENT BISPHOSPHONATES USE: none  PAIN MANAGEMENT: Percocent  NARCOTICS INDUCED CONSTIPATION: none  LIVING WILL AND CODE STATUS: ?    INTERVAL HISTORY: Shakenna Herrero Morrisette 68 y.o. female returns to the clinic today for followup visit accompanied by her husband. The patient is feeling fine today with no specific complaints. She tolerated the third cycle of her systemic chemotherapy with cisplatin and Alimta fairly well with no significant adverse effect except for occasional insomnia related to the dexamethasone. She continues to have some pain and soreness at her surgical site. She requests a refill for her Percocet tablets. She does note a dry hacking  cough with clear runny nose and watery eyes over the past couple weeks. She feels that this is related to seasonal allergies. The patient denied having any nausea or vomiting. She has no fever or chills. She has no significant chest pain, shortness breath, or hemoptysis. She is here today to start cycle #4 of her adjuvant chemotherapy.  MEDICAL HISTORY: Past Medical History  Diagnosis Date  . Hyperlipidemia     takes Zocor daily  . Cavitating mass in right upper lung lobe   . Lung nodules     right upper lobe  . GERD (gastroesophageal reflux disease)     takes Nexium daily  . Hypertension     takes Amlodipine daily and Chlorthalidone as well  . Dizziness     occasionally   . Arthritis     back   . History of colon polyps   . Diverticulosis   . Family history of kidney infection     pt history-saw Dr.Wrenn  . Nocturia   . Lung cancer, Right upper lobe     ALLERGIES:  has No Known Allergies.  MEDICATIONS:  Current Outpatient Prescriptions  Medication Sig Dispense Refill  . amLODipine (NORVASC) 5 MG tablet Take 5 mg by mouth daily.      . chlorthalidone (HYGROTON) 25 MG tablet Take 25 mg by mouth daily.      . Cholecalciferol (VITAMIN D-3 PO) Take 1 tablet by mouth 2 (two) times a week.       Marland Kitchen dexamethasone (DECADRON) 4 MG tablet 4 Milligram by mouth twice a day the day before, day  of and day after the chemotherapy every 3 weeks.  30 tablet  0  . esomeprazole (NEXIUM) 40 MG capsule Take 40 mg by mouth daily before breakfast.      . folic acid (FOLVITE) 1 MG tablet Take 1 tablet (1 mg total) by mouth daily.  30 tablet  2  . Multiple Vitamins-Minerals (PRESERVISION AREDS PO) Take 1 tablet by mouth daily.       Marland Kitchen oxyCODONE-acetaminophen (PERCOCET/ROXICET) 5-325 MG per tablet Take 1-2 tablets by mouth every 8 (eight) hours as needed for severe pain.  30 tablet  0  . simvastatin (ZOCOR) 20 MG tablet Take 20 mg by mouth every evening.      . potassium chloride SA (K-DUR,KLOR-CON) 20  MEQ tablet Take 1 tablet (20 mEq total) by mouth daily.  7 tablet  0  . prochlorperazine (COMPAZINE) 10 MG tablet Take 1 tablet (10 mg total) by mouth every 6 (six) hours as needed for nausea or vomiting.  60 tablet  0   No current facility-administered medications for this visit.    SURGICAL HISTORY:  Past Surgical History  Procedure Laterality Date  . Cardiovascular stress test  06/29/2009    EF 79%, NO ISCHEMIA  . Tracheostomy      at age 76 or 5 d/t polyps on vocal cord  . Abdominal hysterectomy      at age 61;partial   . Colonoscopy    . Video bronchoscopy N/A 07/08/2013    Procedure: VIDEO BRONCHOSCOPY;  Surgeon: [name], MD;  Location: [facility] OR;  Service: Thoracic;  Laterality: N/A;  . Video assisted thoracoscopy (vats)/wedge resection Right 07/08/2013    Procedure: VIDEO ASSISTED THORACOSCOPY (VATS)/WEDGE RESECTION;  Surgeon: [name], MD;  Location: [facility];  Service: Thoracic;  Laterality: Right;    REVIEW OF SYSTEMS:  Constitutional: negative Eyes: negative Ears, nose, mouth, throat, and face: positive for Clear runny nose as well as watery eyes related to seasonal allergies Respiratory: positive for cough and Nonproductive Cardiovascular: negative Gastrointestinal: negative Genitourinary:negative Integument/breast: negative Hematologic/lymphatic: negative Musculoskeletal:negative Neurological: negative Behavioral/Psych: positive for sleep disturbance Endocrine: negative Allergic/Immunologic: negative   PHYSICAL EXAMINATION: General appearance: alert, cooperative and no distress Head: Normocephalic, without obvious abnormality, atraumatic Neck: no adenopathy, no JVD, supple, symmetrical, trachea midline and thyroid not enlarged, symmetric, no tenderness/mass/nodules Lymph nodes: Cervical, supraclavicular, and axillary nodes normal. Resp: clear to auscultation bilaterally Back: symmetric, no curvature. ROM normal. No CVA tenderness. Cardio: regular rate  and rhythm, S1, S2 normal, no murmur, click, rub or gallop GI: soft, non-tender; bowel sounds normal; no masses,  no organomegaly Extremities: extremities normal, atraumatic, no cyanosis or edema  ECOG PERFORMANCE STATUS: 1 - Symptomatic but completely ambulatory  Blood pressure 160/79, pulse 77, temperature 97.8 F (36.6 C), temperature source Oral, resp. rate 18, height 5\' 4"  (1.626 m), weight 136 lb 8 oz (61.916 kg), SpO2 100.00%.  LABORATORY DATA: Lab Results  Component Value Date   WBC 5.1 10/23/2013   HGB 12.4 10/23/2013   HCT 35.8 10/23/2013   MCV 96.0 10/23/2013   PLT 270 10/23/2013      Chemistry      Component Value Date/Time   NA 137 10/23/2013 0912   NA 134* 07/14/2013 0452   K 3.7 10/23/2013 0912   K 3.5 07/14/2013 0452   CL 93* 07/14/2013 0452   CO2 26 10/23/2013 0912   CO2 32 07/14/2013 0452   BUN 21.9 10/23/2013 0912   BUN 18 07/14/2013 0452   CREATININE 1.1  10/23/2013 0912   CREATININE 0.87 07/14/2013 0452      Component Value Date/Time   CALCIUM 10.7* 10/23/2013 0912   CALCIUM 9.4 07/14/2013 0452   ALKPHOS 104 10/23/2013 0912   ALKPHOS 89 07/10/2013 0400   AST 17 10/23/2013 0912   AST 31 07/10/2013 0400   ALT 13 10/23/2013 0912   ALT 21 07/10/2013 0400   BILITOT 0.23 10/23/2013 0912   BILITOT 0.3 07/10/2013 0400       RADIOGRAPHIC STUDIES: No results found.  ASSESSMENT AND PLAN: This is a very pleasant 68 years old white female with synchronous non-small cell lung cancer presenting with stage IA as well as a stage IB status post right upper lobectomy with lymph node dissection and currently undergoing adjuvant chemotherapy with cisplatin and Alimta status post 3 cycles. The patient is tolerating her treatment fairly well with no significant adverse effects. Patient was discussed with and also seen by Dr. Julien Nordmann. She will proceed with cycle #4 today as scheduled. She'll follow up with Dr. Julien Nordmann in approximately 4-5 weeks with restaging CT scan of her chest with contrast  to reevaluate her disease. She was given a refill prescription for her Percocet 5/325 mg tablets a total of 30 tablets with no refill.  She was advised to call immediately she has any concerning symptoms in the interval. The patient voices understanding of current disease status and treatment options and is in agreement with the current care plan.  All questions were answered. The patient knows to call the clinic with any problems, questions or concerns. We can certainly see the patient much sooner if necessary.  Carlton Adam PA-C  ADDENDUM: Hematology/Oncology Attending: I had a face to face encounter with the patient. I recommended her care plan. This is a very pleasant 68 years old white female with synchronous stage I A. And a stage IB non-small cell lung cancer status post resection and currently undergoing adjuvant chemotherapy with cisplatin and Alimta status post 3 cycles. The patient is tolerating her treatment fairly well except for mild fatigue. We'll proceed with cycle #4 today as scheduled. She would come back for followup visit in one month for reevaluation after repeating CT scan of the chest for restaging of her disease. She was advised to call immediately if she has any concerning symptoms in the interval.  Disclaimer: This note was dictated with voice recognition software. Similar sounding words can inadvertently be transcribed and may not be corrected upon review. Eilleen Kempf., MD 10/27/2013

## 2013-10-23 NOTE — Patient Instructions (Signed)
[facility] Discharge Instructions for Patients Receiving Chemotherapy  Today you received the following chemotherapy agents Cisplatin, Alimta.  To help prevent nausea and vomiting after your treatment, we encourage you to take your nausea medication Compazine 10 mg, 1 tablet every six hours OR 30 minutes before each meal and at bedtime as needed.   If you develop nausea and vomiting that is not controlled by your nausea medication, call the clinic.   BELOW ARE SYMPTOMS THAT SHOULD BE REPORTED IMMEDIATELY:  *FEVER GREATER THAN 100.5 F  *CHILLS WITH OR WITHOUT FEVER  NAUSEA AND VOMITING THAT IS NOT CONTROLLED WITH YOUR NAUSEA MEDICATION  *UNUSUAL SHORTNESS OF BREATH  *UNUSUAL BRUISING OR BLEEDING  TENDERNESS IN MOUTH AND THROAT WITH OR WITHOUT PRESENCE OF ULCERS  *URINARY PROBLEMS  *BOWEL PROBLEMS  UNUSUAL RASH Items with * indicate a potential emergency and should be followed up as soon as possible.  Feel free to call the clinic you have any questions or concerns. The clinic phone number is [phone].

## 2013-10-23 NOTE — Progress Notes (Signed)
Discharged at 1655 ambulatory in no distress with spouse after ringing the bell to home.

## 2013-10-24 ENCOUNTER — Other Ambulatory Visit: Payer: Self-pay | Admitting: Medical Oncology

## 2013-10-24 ENCOUNTER — Telehealth: Payer: Self-pay | Admitting: Internal Medicine

## 2013-10-24 DIAGNOSIS — C3410 Malignant neoplasm of upper lobe, unspecified bronchus or lung: Secondary | ICD-10-CM

## 2013-10-24 MED ORDER — FOLIC ACID 1 MG PO TABS: 1.0000 mg | ORAL_TABLET | Freq: Every day | ORAL | Status: DC

## 2013-10-24 NOTE — Telephone Encounter (Signed)
folic acid refills sent to pharmacy

## 2013-10-24 NOTE — Patient Instructions (Signed)
Followup with Dr. Julien Nordmann in approximately one month with a restaging CT scan of her chest to reevaluate your disease

## 2013-10-24 NOTE — Telephone Encounter (Signed)
, °

## 2013-10-30 ENCOUNTER — Other Ambulatory Visit (HOSPITAL_BASED_OUTPATIENT_CLINIC_OR_DEPARTMENT_OTHER): Payer: Medicare Other

## 2013-10-30 DIAGNOSIS — C3410 Malignant neoplasm of upper lobe, unspecified bronchus or lung: Secondary | ICD-10-CM

## 2013-10-30 LAB — MAGNESIUM (CC13): MAGNESIUM: 1.5 mg/dL (ref 1.5–2.5)

## 2013-10-30 LAB — CBC WITH DIFFERENTIAL/PLATELET
BASO%: 0.3 % (ref 0.0–2.0)
BASOS ABS: 0 10*3/uL (ref 0.0–0.1)
EOS ABS: 0 10*3/uL (ref 0.0–0.5)
EOS%: 0.4 % (ref 0.0–7.0)
HEMATOCRIT: 34 % — AB (ref 34.8–46.6)
HEMOGLOBIN: 12 g/dL (ref 11.6–15.9)
LYMPH#: 1.8 10*3/uL (ref 0.9–3.3)
LYMPH%: 59.2 % — ABNORMAL HIGH (ref 14.0–49.7)
MCH: 33.4 pg (ref 25.1–34.0)
MCHC: 35.2 g/dL (ref 31.5–36.0)
MCV: 94.8 fL (ref 79.5–101.0)
MONO#: 0.1 10*3/uL (ref 0.1–0.9)
MONO%: 4 % (ref 0.0–14.0)
NEUT%: 36.1 % — ABNORMAL LOW (ref 38.4–76.8)
NEUTROS ABS: 1.1 10*3/uL — AB (ref 1.5–6.5)
Platelets: 148 10*3/uL (ref 145–400)
RBC: 3.59 10*6/uL — ABNORMAL LOW (ref 3.70–5.45)
RDW: 16.5 % — ABNORMAL HIGH (ref 11.2–14.5)
WBC: 3.1 10*3/uL — ABNORMAL LOW (ref 3.9–10.3)

## 2013-10-30 LAB — COMPREHENSIVE METABOLIC PANEL (CC13)
ALBUMIN: 4.2 g/dL (ref 3.5–5.0)
ALT: 27 U/L (ref 0–55)
AST: 19 U/L (ref 5–34)
Alkaline Phosphatase: 92 U/L (ref 40–150)
Anion Gap: 14 mEq/L — ABNORMAL HIGH (ref 3–11)
BUN: 34.6 mg/dL — AB (ref 7.0–26.0)
CHLORIDE: 92 meq/L — AB (ref 98–109)
CO2: 27 mEq/L (ref 22–29)
Calcium: 9.8 mg/dL (ref 8.4–10.4)
Creatinine: 1.2 mg/dL — ABNORMAL HIGH (ref 0.6–1.1)
GLUCOSE: 102 mg/dL (ref 70–140)
Potassium: 4.1 mEq/L (ref 3.5–5.1)
Sodium: 132 mEq/L — ABNORMAL LOW (ref 136–145)
Total Bilirubin: 0.29 mg/dL (ref 0.20–1.20)
Total Protein: 6.7 g/dL (ref 6.4–8.3)

## 2013-10-31 ENCOUNTER — Telehealth: Payer: Self-pay | Admitting: Medical Oncology

## 2013-10-31 NOTE — Telephone Encounter (Signed)
Wants to know when she can have a perm in her hair. I told her in one month from her last chemotherapy.

## 2013-11-06 ENCOUNTER — Other Ambulatory Visit (HOSPITAL_BASED_OUTPATIENT_CLINIC_OR_DEPARTMENT_OTHER): Payer: Medicare Other

## 2013-11-06 DIAGNOSIS — C3410 Malignant neoplasm of upper lobe, unspecified bronchus or lung: Secondary | ICD-10-CM

## 2013-11-06 LAB — CBC WITH DIFFERENTIAL/PLATELET
BASO%: 0.2 % (ref 0.0–2.0)
Basophils Absolute: 0 10*3/uL (ref 0.0–0.1)
EOS%: 0.7 % (ref 0.0–7.0)
Eosinophils Absolute: 0 10*3/uL (ref 0.0–0.5)
HEMATOCRIT: 31.2 % — AB (ref 34.8–46.6)
HGB: 11 g/dL — ABNORMAL LOW (ref 11.6–15.9)
LYMPH%: 54.3 % — AB (ref 14.0–49.7)
MCH: 34.1 pg — AB (ref 25.1–34.0)
MCHC: 35.4 g/dL (ref 31.5–36.0)
MCV: 96.3 fL (ref 79.5–101.0)
MONO#: 0.4 10*3/uL (ref 0.1–0.9)
MONO%: 12.1 % (ref 0.0–14.0)
NEUT#: 1.1 10*3/uL — ABNORMAL LOW (ref 1.5–6.5)
NEUT%: 32.7 % — AB (ref 38.4–76.8)
Platelets: 173 10*3/uL (ref 145–400)
RBC: 3.24 10*6/uL — AB (ref 3.70–5.45)
RDW: 16.4 % — ABNORMAL HIGH (ref 11.2–14.5)
WBC: 3.3 10*3/uL — ABNORMAL LOW (ref 3.9–10.3)
lymph#: 1.8 10*3/uL (ref 0.9–3.3)

## 2013-11-06 LAB — COMPREHENSIVE METABOLIC PANEL (CC13)
ALK PHOS: 99 U/L (ref 40–150)
ALT: 10 U/L (ref 0–55)
AST: 15 U/L (ref 5–34)
Albumin: 4.1 g/dL (ref 3.5–5.0)
Anion Gap: 11 mEq/L (ref 3–11)
BILIRUBIN TOTAL: 0.3 mg/dL (ref 0.20–1.20)
BUN: 21.7 mg/dL (ref 7.0–26.0)
CO2: 27 mEq/L (ref 22–29)
CREATININE: 1.2 mg/dL — AB (ref 0.6–1.1)
Calcium: 10 mg/dL (ref 8.4–10.4)
Chloride: 99 mEq/L (ref 98–109)
Glucose: 98 mg/dl (ref 70–140)
Potassium: 3.9 mEq/L (ref 3.5–5.1)
Sodium: 137 mEq/L (ref 136–145)
Total Protein: 6.7 g/dL (ref 6.4–8.3)

## 2013-11-06 LAB — MAGNESIUM (CC13): Magnesium: 1.9 mg/dl (ref 1.5–2.5)

## 2013-11-13 ENCOUNTER — Other Ambulatory Visit (HOSPITAL_BASED_OUTPATIENT_CLINIC_OR_DEPARTMENT_OTHER): Payer: Medicare Other

## 2013-11-13 DIAGNOSIS — C3410 Malignant neoplasm of upper lobe, unspecified bronchus or lung: Secondary | ICD-10-CM

## 2013-11-13 LAB — CBC WITH DIFFERENTIAL/PLATELET
BASO%: 0.4 % (ref 0.0–2.0)
BASOS ABS: 0 10*3/uL (ref 0.0–0.1)
EOS ABS: 0.1 10*3/uL (ref 0.0–0.5)
EOS%: 1.6 % (ref 0.0–7.0)
HEMATOCRIT: 31 % — AB (ref 34.8–46.6)
HEMOGLOBIN: 11 g/dL — AB (ref 11.6–15.9)
LYMPH%: 50.4 % — ABNORMAL HIGH (ref 14.0–49.7)
MCH: 34.4 pg — ABNORMAL HIGH (ref 25.1–34.0)
MCHC: 35.4 g/dL (ref 31.5–36.0)
MCV: 97.1 fL (ref 79.5–101.0)
MONO#: 0.5 10*3/uL (ref 0.1–0.9)
MONO%: 14.6 % — ABNORMAL HIGH (ref 0.0–14.0)
NEUT#: 1.1 10*3/uL — ABNORMAL LOW (ref 1.5–6.5)
NEUT%: 33 % — ABNORMAL LOW (ref 38.4–76.8)
PLATELETS: 241 10*3/uL (ref 145–400)
RBC: 3.19 10*6/uL — ABNORMAL LOW (ref 3.70–5.45)
RDW: 16.5 % — ABNORMAL HIGH (ref 11.2–14.5)
WBC: 3.3 10*3/uL — ABNORMAL LOW (ref 3.9–10.3)
lymph#: 1.6 10*3/uL (ref 0.9–3.3)

## 2013-11-13 LAB — COMPREHENSIVE METABOLIC PANEL (CC13)
ALT: 9 U/L (ref 0–55)
ANION GAP: 10 meq/L (ref 3–11)
AST: 18 U/L (ref 5–34)
Albumin: 4.3 g/dL (ref 3.5–5.0)
Alkaline Phosphatase: 102 U/L (ref 40–150)
BUN: 25 mg/dL (ref 7.0–26.0)
CO2: 29 meq/L (ref 22–29)
CREATININE: 1.2 mg/dL — AB (ref 0.6–1.1)
Calcium: 10 mg/dL (ref 8.4–10.4)
Chloride: 98 mEq/L (ref 98–109)
Glucose: 101 mg/dl (ref 70–140)
Potassium: 3.8 mEq/L (ref 3.5–5.1)
SODIUM: 137 meq/L (ref 136–145)
TOTAL PROTEIN: 6.9 g/dL (ref 6.4–8.3)
Total Bilirubin: 0.43 mg/dL (ref 0.20–1.20)

## 2013-11-13 LAB — MAGNESIUM (CC13): Magnesium: 1.9 mg/dl (ref 1.5–2.5)

## 2013-11-20 ENCOUNTER — Ambulatory Visit (HOSPITAL_COMMUNITY)
Admission: RE | Admit: 2013-11-20 | Discharge: 2013-11-20 | Disposition: A | Discharge status: Home / Self Care | Payer: Medicare Other | Source: Ambulatory Visit | Attending: Physician Assistant | Admitting: Physician Assistant

## 2013-11-20 ENCOUNTER — Encounter (HOSPITAL_COMMUNITY): Payer: Self-pay

## 2013-11-20 DIAGNOSIS — C3410 Malignant neoplasm of upper lobe, unspecified bronchus or lung: Secondary | ICD-10-CM

## 2013-11-20 DIAGNOSIS — R911 Solitary pulmonary nodule: Secondary | ICD-10-CM | POA: Insufficient documentation

## 2013-11-20 DIAGNOSIS — Z9221 Personal history of antineoplastic chemotherapy: Secondary | ICD-10-CM | POA: Insufficient documentation

## 2013-11-20 DIAGNOSIS — Z90710 Acquired absence of both cervix and uterus: Secondary | ICD-10-CM | POA: Insufficient documentation

## 2013-11-20 DIAGNOSIS — I701 Atherosclerosis of renal artery: Secondary | ICD-10-CM | POA: Insufficient documentation

## 2013-11-20 DIAGNOSIS — J438 Other emphysema: Secondary | ICD-10-CM | POA: Insufficient documentation

## 2013-11-20 DIAGNOSIS — K7460 Unspecified cirrhosis of liver: Secondary | ICD-10-CM | POA: Insufficient documentation

## 2013-11-20 DIAGNOSIS — Z902 Acquired absence of lung [part of]: Secondary | ICD-10-CM | POA: Insufficient documentation

## 2013-11-20 DIAGNOSIS — C3490 Malignant neoplasm of unspecified part of unspecified bronchus or lung: Secondary | ICD-10-CM | POA: Insufficient documentation

## 2013-11-20 MED ORDER — IOHEXOL 300 MG/ML  SOLN
80.0000 mL | Freq: Once | INTRAMUSCULAR | Status: AC | PRN
Start: 2013-11-20 — End: 2013-11-20
  Administered 2013-11-20: 80 mL via INTRAVENOUS

## 2013-11-21 ENCOUNTER — Other Ambulatory Visit: Payer: Self-pay | Admitting: Physician Assistant

## 2013-11-21 DIAGNOSIS — C3410 Malignant neoplasm of upper lobe, unspecified bronchus or lung: Secondary | ICD-10-CM

## 2013-11-25 ENCOUNTER — Other Ambulatory Visit: Payer: Self-pay | Admitting: Cardiothoracic Surgery

## 2013-11-25 ENCOUNTER — Encounter: Payer: Self-pay | Admitting: Internal Medicine

## 2013-11-25 ENCOUNTER — Ambulatory Visit (HOSPITAL_BASED_OUTPATIENT_CLINIC_OR_DEPARTMENT_OTHER): Payer: Medicare Other | Admitting: Internal Medicine

## 2013-11-25 ENCOUNTER — Telehealth: Payer: Self-pay | Admitting: Internal Medicine

## 2013-11-25 VITALS — BP 147/71 | HR 82 | Temp 98.4°F | Resp 20 | Ht 64.0 in | Wt 135.6 lb

## 2013-11-25 DIAGNOSIS — C3410 Malignant neoplasm of upper lobe, unspecified bronchus or lung: Secondary | ICD-10-CM

## 2013-11-25 DIAGNOSIS — C3490 Malignant neoplasm of unspecified part of unspecified bronchus or lung: Secondary | ICD-10-CM

## 2013-11-25 NOTE — Telephone Encounter (Signed)
gv adn printed appt sched and avs for pt for July  °

## 2013-11-25 NOTE — Progress Notes (Signed)
[facility] Telephone:(336) [phone]   Fax:(336) [phone]  OFFICE PROGRESS NOTE  [name], [address], [place]  [place] [address]  DIAGNOSIS: Cancer of upper lobe of lung  Primary site: Lung (Right)  Staging method: AJCC 7th Edition  Clinical: (T1a, N0)  Pathologic: Stage IA (T1a, N0, cM0) signed by [name], MD on 07/09/2013 1:58 PM  Summary: Stage IA (T1a, N0, cM0)  Lung cancer, Right upper lobe  Primary site: Lung (Right)  Pathologic: Stage IB (T2a, N0, cM0) signed by [name], MD on 07/09/2013 1:54 PM  Summary: Stage IB (T2a, N0, cM0)   PRIOR THERAPY:  1) Status post right video-assisted thoracoscopy, wedge resection, right upper lobe with completion of right upper lobectomy with lymph node dissection under the care of Dr. Servando Snare on 07/08/2013. 2) Adjuvant chemotherapy with systemic chemotherapy in the form of cisplatin at 75 mg per meter squared and Alimta 500 mg per meter squared given every 3 weeks for a total of 4 cycles, status post 4 cycles. Last cycle was given 10/23/2013.  CURRENT THERAPY: Observation.  CHEMOTHERAPY INTENT: Curative  CURRENT # OF CHEMOTHERAPY CYCLES: 0 CURRENT ANTIEMETICS: Aloxi, dexamethasone, Compazine  CURRENT SMOKING STATUS: Former smoker, quit 08/08/1978  ORAL CHEMOTHERAPY AND CONSENT: n/a  CURRENT BISPHOSPHONATES USE: none  PAIN MANAGEMENT: Percocent  NARCOTICS INDUCED CONSTIPATION: none  LIVING WILL AND CODE STATUS: ?    INTERVAL HISTORY: [name] 68 y.o. female returns to the clinic today for followup visit accompanied by her husband. The patient is feeling fine today with no specific complaints. She tolerated the last cycle of her systemic chemotherapy with cisplatin and Alimta fairly well with no significant adverse effects. The patient denied having any nausea or vomiting. She has no fever or chills. She has no significant chest pain, shortness of breath, cough or hemoptysis.  She has repeat CT scan of the chest performed recently and she is here for evaluation and discussion of her scan results.  MEDICAL HISTORY: Past Medical History  Diagnosis Date  . Hyperlipidemia     takes Zocor daily  . Cavitating mass in right upper lung lobe   . Lung nodules     right upper lobe  . GERD (gastroesophageal reflux disease)     takes Nexium daily  . Hypertension     takes Amlodipine daily and Chlorthalidone as well  . Dizziness     occasionally   . Arthritis     back   . History of colon polyps   . Diverticulosis   . Family history of kidney infection     pt history-saw Dr.Wrenn  . Nocturia   . Lung cancer, Right upper lobe     ALLERGIES:  has No Known Allergies.  MEDICATIONS:  Current Outpatient Prescriptions  Medication Sig Dispense Refill  . amLODipine (NORVASC) 5 MG tablet Take 5 mg by mouth daily.      . chlorthalidone (HYGROTON) 25 MG tablet Take 25 mg by mouth daily.      . Cholecalciferol (VITAMIN D-3 PO) Take 1 tablet by mouth 2 (two) times a week.       . esomeprazole (NEXIUM) 40 MG capsule Take 40 mg by mouth daily before breakfast.      . folic acid (FOLVITE) 1 MG tablet TAKE 1 TABLET (1 MG TOTAL) BY MOUTH DAILY.  30 tablet  3  . Multiple Vitamins-Minerals (PRESERVISION AREDS PO) Take 1 tablet by mouth daily.       Marland Kitchen  oxyCODONE-acetaminophen (PERCOCET/ROXICET) 5-325 MG per tablet Take 1-2 tablets by mouth every 8 (eight) hours as needed for severe pain.  30 tablet  0  . simvastatin (ZOCOR) 20 MG tablet Take 20 mg by mouth every evening.       No current facility-administered medications for this visit.    SURGICAL HISTORY:  Past Surgical History  Procedure Laterality Date  . Cardiovascular stress test  06/29/2009    EF 79%, NO ISCHEMIA  . Tracheostomy      at age 48 or 5 d/t polyps on vocal cord  . Abdominal hysterectomy      at age 61;partial   . Colonoscopy    . Video bronchoscopy N/A 07/08/2013    Procedure: VIDEO BRONCHOSCOPY;   Surgeon: [name], MD;  Location: [facility] OR;  Service: Thoracic;  Laterality: N/A;  . Video assisted thoracoscopy (vats)/wedge resection Right 07/08/2013    Procedure: VIDEO ASSISTED THORACOSCOPY (VATS)/WEDGE RESECTION;  Surgeon: [name], MD;  Location: [facility];  Service: Thoracic;  Laterality: Right;    REVIEW OF SYSTEMS:  Constitutional: negative Eyes: negative Ears, nose, mouth, throat, and face: negative Respiratory: negative Cardiovascular: negative Gastrointestinal: negative Genitourinary:negative Integument/breast: negative Hematologic/lymphatic: negative Musculoskeletal:negative Neurological: negative Behavioral/Psych: positive for sleep disturbance Endocrine: negative Allergic/Immunologic: negative   PHYSICAL EXAMINATION: General appearance: alert, cooperative and no distress Head: Normocephalic, without obvious abnormality, atraumatic Neck: no adenopathy, no JVD, supple, symmetrical, trachea midline and thyroid not enlarged, symmetric, no tenderness/mass/nodules Lymph nodes: Cervical, supraclavicular, and axillary nodes normal. Resp: clear to auscultation bilaterally Back: symmetric, no curvature. ROM normal. No CVA tenderness. Cardio: regular rate and rhythm, S1, S2 normal, no murmur, click, rub or gallop GI: soft, non-tender; bowel sounds normal; no masses,  no organomegaly Extremities: extremities normal, atraumatic, no cyanosis or edema  ECOG PERFORMANCE STATUS: 1 - Symptomatic but completely ambulatory  Blood pressure 147/71, pulse 82, temperature 98.4 F (36.9 C), temperature source Oral, resp. rate 20, height 5\' 4"  (1.626 m), weight 135 lb 9.6 oz (61.508 kg).  LABORATORY DATA: Lab Results  Component Value Date   WBC 3.3* 11/13/2013   HGB 11.0* 11/13/2013   HCT 31.0* 11/13/2013   MCV 97.1 11/13/2013   PLT 241 11/13/2013      Chemistry      Component Value Date/Time   NA 137 11/13/2013 0829   NA 134* 07/14/2013 0452   K 3.8 11/13/2013 0829   K 3.5  07/14/2013 0452   CL 93* 07/14/2013 0452   CO2 29 11/13/2013 0829   CO2 32 07/14/2013 0452   BUN 25.0 11/13/2013 0829   BUN 18 07/14/2013 0452   CREATININE 1.2* 11/13/2013 0829   CREATININE 0.87 07/14/2013 0452      Component Value Date/Time   CALCIUM 10.0 11/13/2013 0829   CALCIUM 9.4 07/14/2013 0452   ALKPHOS 102 11/13/2013 0829   ALKPHOS 89 07/10/2013 0400   AST 18 11/13/2013 0829   AST 31 07/10/2013 0400   ALT 9 11/13/2013 0829   ALT 21 07/10/2013 0400   BILITOT 0.43 11/13/2013 0829   BILITOT 0.3 07/10/2013 0400       RADIOGRAPHIC STUDIES: Ct Chest W Contrast  11/20/2013   CLINICAL DATA:  Restaging of non-small-cell lung cancer. Diagnosed 2014. Chemotherapy ongoing. Right lung surgery. Hysterectomy.  EXAM: CT CHEST WITH CONTRAST  TECHNIQUE: Multidetector CT imaging of the chest was performed during intravenous contrast administration.  CONTRAST:  61mL OMNIPAQUE IOHEXOL 300 MG/ML  SOLN  COMPARISON:  Plain film 08/29/2013. PET 07/01/2013. Chest  CT 06/19/2013.  FINDINGS: Lungs/Pleura: Status post right upper lobectomy. Mild centrilobular emphysema. High right upper lobe ground-glass nodule measures 6 mm on image 13. This may represent the 3 mm ground-glass nodule on image 23 of the prior exam. Possibly mildly enlarged.  4 mm left lower lobe lung nodule on image 38 which is similar to on the prior exam. Immediately cephalad vague 2 mm nodule on image 35 is also felt to be similar.  5 mm ground-glass opacity in the superior segment left lower lobe on image 26 is unchanged.  A left upper lobe 2 mm nodule on image 18 is not readily apparent on the prior.  No pleural fluid.  Heart/Mediastinum: No supraclavicular adenopathy. Aortic and branch vessel atherosclerosis. Normal heart size, without pericardial effusion. No central pulmonary embolism, on this non-dedicated study. No mediastinal or hilar adenopathy.  Upper Abdomen: Mild cirrhosis. Scarred kidneys bilaterally. Normal adrenal glands. Atherosclerosis at the origin  of bilateral renal arteries.  Bones/Musculoskeletal: Surgical defects of anterior right ribs. Sclerosis in the anterolateral right fourth rib is presumably due to postsurgical healing. Mild superior inferior endplate irregularity at T9 is unchanged.  IMPRESSION: 1. Surgical changes of right upper lobectomy, without locally recurrent or metastatic disease. 2. Centrilobular emphysema with scattered smaller pulmonary nodules. The majority of these are similar. Right lower lobe and left apical nodules may be new/ enlarged as detailed above. These warrant followup attention. 3. Mild cirrhosis. 4. Similar mild T9 compression deformity.   Electronically Signed   By: Abigail Miyamoto M.D.   On: 11/20/2013 09:36    ASSESSMENT AND PLAN: This is a very pleasant 68 years old white female with synchronous non-small cell lung cancer presenting with stage IA as well as a stage IB status post right upper lobectomy with lymph node dissection and currently undergoing adjuvant chemotherapy with cisplatin and Alimta status post 4 cycles. The patient is tolerating her treatment fairly well with no significant adverse effects. Her recent CT scan of the chest showed no significant evidence for disease recurrence except for right lower lobe and left apical nodules that need to be monitored closely. I discussed the scan results with the patient and her husband today. I recommended for her to continue on observation with repeat CT scan of the chest in 3 months for reevaluation of her disease. She was advised to call immediately she has any concerning symptoms in the interval. The patient voices understanding of current disease status and treatment options and is in agreement with the current care plan.  All questions were answered. The patient knows to call the clinic with any problems, questions or concerns. We can certainly see the patient much sooner if necessary.  Disclaimer: This note was dictated with voice recognition software.  Similar sounding words can inadvertently be transcribed and may not be corrected upon review.

## 2013-11-28 ENCOUNTER — Encounter: Payer: Self-pay | Admitting: Cardiothoracic Surgery

## 2013-11-28 ENCOUNTER — Ambulatory Visit (INDEPENDENT_AMBULATORY_CARE_PROVIDER_SITE_OTHER): Payer: Medicare Other | Admitting: Cardiothoracic Surgery

## 2013-11-28 ENCOUNTER — Ambulatory Visit
Admission: RE | Admit: 2013-11-28 | Discharge: 2013-11-28 | Disposition: A | Discharge status: Home / Self Care | Payer: Medicare Other | Source: Ambulatory Visit | Attending: Cardiothoracic Surgery | Admitting: Cardiothoracic Surgery

## 2013-11-28 VITALS — BP 137/83 | HR 82 | Resp 16 | Ht 64.0 in | Wt 135.0 lb

## 2013-11-28 DIAGNOSIS — C3490 Malignant neoplasm of unspecified part of unspecified bronchus or lung: Secondary | ICD-10-CM

## 2013-11-28 DIAGNOSIS — C3410 Malignant neoplasm of upper lobe, unspecified bronchus or lung: Secondary | ICD-10-CM

## 2013-11-28 DIAGNOSIS — Z902 Acquired absence of lung [part of]: Secondary | ICD-10-CM

## 2013-11-28 DIAGNOSIS — Z9889 Other specified postprocedural states: Secondary | ICD-10-CM

## 2013-11-28 NOTE — Progress Notes (Signed)
[address]       [place] [id number]             [phone]                     [name] Medical Record #[id number] Date of Birth: 1945-12-24  Meghan Dy, MD Meghan Jacobson, MD  Chief Complaint:   PostOp Follow Up Visit 07/08/2013  OPERATIVE REPORT  PREOPERATIVE DIAGNOSIS: Right upper lobe lung mass.  POSTOPERATIVE DIAGNOSIS: Carcinoma of the right upper lobe.  PROCEDURE PERFORMED: Bronchoscopy, right video-assisted thoracoscopy,  wedge resection, right upper lobe, with completion of right upper  lobectomy, lymph node dissection, and placement of On-Q device.  SURGEON: Meghan Bal, MD    Adjuvant chemotherapy with systemic chemotherapy in the form of cisplatin at 75 mg per meter squared and Alimta 500 mg per meter squared given every 3 weeks for a total of 4 cycles, status post 4 cycles. Last cycle was given 10/23/2013.   PATH: ALK EGFR negative , [facility]  mRNA testing completed see scanned document Diagnosis 1. 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 - PERINEURAL INVASION IS IDENTIFIED. - THE SURGICAL RESECTION MARGINS ARE NEGATIVE FOR ADENOCARCINOMA. - SEE ONCOLOGY TABLE BELOW. 2. 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 - ATYPICAL ADENOMATOUS HYPERPLASIA. - THERE IS NO EVIDENCE OF CARCINOMA IN 2 OF 2 LYMPH NODES (0/2). - THE SURGICAL RESECTION MARGINS ARE NEGATIVE FOR CARCINOMA. - SEE ONCOLOGY TABLE BELOW. 3. Lymph node, biopsy, 10 R - THERE IS NO EVIDENCE OF CARCINOMA IN 1 OF 1 LYMPH NODE (0/1). 1 of 4 FINAL for [name], [name] [phone]) Diagnosis(continued) 4. Lymph node, biopsy, 11 R - THERE IS NO EVIDENCE OF CARCINOMA IN 1 OF 1 LYMPH NODE (0/1). 5. Lymph node, biopsy, 12 R - THERE IS NO EVIDENCE OF CARCINOMA IN 1 OF 1 LYMPH NODE  (0/1) 6. Lymph node, biopsy, 13 R - THERE IS NO EVIDENCE OF CARCINOMA IN 1 OF 1 LYMPH NODE (0/1). 7. Lymph node, biopsy, 4 R - THERE IS NO EVIDENCE OF CARCINOMA IN 1 OF 1 LYMPH NODE (0/1). Microscopic Comment 1. LUNG (Part 1) Specimen, including laterality: Right upper lobe Procedure: Wedge resection followed by completion lobectomy Specimen integrity (intact/disrupted): Intact Tumor site: Right upper lobe Tumor focality: Adenocarcinoma is unifocal Maximum tumor size (cm): 4.3 cm (gross measurement) Histologic type: Adenocarcinoma. Grade: Well differentiated (low grade Margins: Negative for adenocarcinoma Distance to closest margin (cm): 0.8 cm to the nearest stapled margin (gross measurement) Visceral pleura invasion: Not identified Tumor extension: Confined to lung parenchyma Treatment effect (if treated with neoadjuvant therapy): N/A Lymph -Vascular invasion: Not identified Lymph nodes: Number examined - 7; Number N1 nodes positive 0; Number N2 nodes positive 0 TNM code: pT2a, pN0 Ancillary studies: A block will be sent for EGFR and ALK studies and the results reported separately. Comments: The vast majority of the adenocarcinoma has a lepidic growth pattern (so called in-situ adenocarcinoma). However, there are a few small foci of stromal invasion identified. 2. LUNG (Part 2) Specimen, including laterality: Right upper lobe Procedure: Completion lobectomy Specimen integrity (intact/disrupted): Intact Tumor site: Perihilar Tumor focality: Squamous cell carcinoma is unifocal Maximum tumor size (cm): 1.6 cm (gross measurement) Histologic type: Squamous cell carcinoma Grade: Well differentiated (low grade) Margins: Negative for carcinoma Distance to closest margin (  cm): 1.5 cm to the bronchial resection margin (gross measurement) Visceral pleura invasion: Not identified. Tumor extension: Confined to lung parenchyma Treatment effect (if treated with neoadjuvant therapy):  N/A Lymph -Vascular invasion: Not identified Lymph nodes: Number examined - 7; Number N1 nodes positive 0; Number N2 nodes positive 0 TNM code: pT1a, pN0 Ancillary studies: N/A Comments: The squamous cell carcinoma is predominantly in-situ. However, there is a single focus suspicious for early invasion.   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 She is making good progress postoperatively. Increasing her physical activity appropriately level.  She denies any significant shortness of breath. Has had no fever chills. Her incisions are healing well. She has plans to visit her daughter in [place] in the coming week. She is returned to full home activities  Zubrod Score: At the time of surgery this patient's most appropriate activity status/level should be described as: $RemoveBefor'[x]'fGjOJLBELJpe$     0    Normal activity, no symptoms $RemoveBef'[]'ojkTdQRYeQ$     1    Restricted in physical strenuous activity but ambulatory, able to do out light work $RemoveBe'[]'CRSWqFBvs$     2    Ambulatory and capable of self care, unable to do work activities, up and about >50 % of waking hours                              '[]'$     3    Only limited self care, in bed greater than 50% of waking hours $RemoveBefo'[]'ZTVeuRjNkXa$     4    Completely disabled, no self care, confined to bed or chair $Remove'[]'ESFXuDz$     5    Moribund   History  Smoking status  . Former Smoker  . Types: Cigarettes  . Quit date: 08/08/1978  Smokeless tobacco  . Never Used    Comment: quit 35 yrs ago       No Known Allergies  Current Outpatient Prescriptions  Medication Sig Dispense Refill  . amLODipine (NORVASC) 5 MG tablet Take 5 mg by mouth daily.      . chlorthalidone (HYGROTON) 25 MG tablet Take 25 mg by mouth daily.      . Cholecalciferol (VITAMIN D-3 PO) Take 1 tablet by mouth 2 (two) times a week.       . esomeprazole  (NEXIUM) 40 MG capsule Take 40 mg by mouth daily before breakfast.      . folic acid (FOLVITE) 1 MG tablet TAKE 1 TABLET (1 MG TOTAL) BY MOUTH DAILY.  30 tablet  3  . Multiple Vitamins-Minerals (PRESERVISION AREDS PO) Take 1 tablet by mouth daily.       Marland Kitchen oxyCODONE-acetaminophen (PERCOCET/ROXICET) 5-325 MG per tablet Take 1-2 tablets by mouth every 8 (eight) hours as needed for severe pain.  30 tablet  0  . simvastatin (ZOCOR) 20 MG tablet Take 20 mg by mouth every evening.           Physical Exam: BP 137/83  Pulse 82  Resp 16  Ht $R'5\' 4"'[place]$  (1.626 m)  Wt 135 lb (61.236 kg)  BMI 23.16 kg/m2  SpO2 94%  General appearance: alert and cooperative Neurologic: intact Heart: regular rate and rhythm, S1, S2 normal, no murmur, click, rub or gallop Lungs: clear to auscultation bilaterally  Abdomen: soft, non-tender; bowel sounds normal; no masses,  no organomegaly Extremities: extremities normal, atraumatic, no cyanosis or edema and Homans sign is negative, no sign of DVT Wound: Right chest port sites/incisions healing well, sutures removed   Diagnostic Studies & Laboratory data:         Recent Radiology Findings: Dg Chest 2 View  11/28/2013   CLINICAL DATA:  68 year old female with history of right lung cancer and right VATS.  EXAM: CHEST  2 VIEW  COMPARISON:  08/29/2013 and prior chest radiographs.  FINDINGS: The cardiomediastinal silhouette is stable.  Postoperative changes/scarring within the right chest again noted.  Emphysema again identified.  There is no evidence of focal airspace disease, pulmonary edema, suspicious pulmonary nodule/mass, pleural effusion, or pneumothorax. No acute bony abnormalities are identified.  IMPRESSION: No evidence of acute/active cardiopulmonary disease.   Electronically Signed   By: Hassan Rowan M.D.   On: 11/28/2013 11:59     Ct Chest W Contrast  11/20/2013   CLINICAL DATA:  Restaging of non-small-cell lung cancer. Diagnosed 2014. Chemotherapy ongoing. Right lung  surgery. Hysterectomy.  EXAM: CT CHEST WITH CONTRAST  TECHNIQUE: Multidetector CT imaging of the chest was performed during intravenous contrast administration.  CONTRAST:  58mL OMNIPAQUE IOHEXOL 300 MG/ML  SOLN  COMPARISON:  Plain film 08/29/2013. PET 07/01/2013. Chest CT 06/19/2013.  FINDINGS: Lungs/Pleura: Status post right upper lobectomy. Mild centrilobular emphysema. High right upper lobe ground-glass nodule measures 6 mm on image 13. This may represent the 3 mm ground-glass nodule on image 23 of the prior exam. Possibly mildly enlarged.  4 mm left lower lobe lung nodule on image 38 which is similar to on the prior exam. Immediately cephalad vague 2 mm nodule on image 35 is also felt to be similar.  5 mm ground-glass opacity in the superior segment left lower lobe on image 26 is unchanged.  A left upper lobe 2 mm nodule on image 18 is not readily apparent on the prior.  No pleural fluid.  Heart/Mediastinum: No supraclavicular adenopathy. Aortic and branch vessel atherosclerosis. Normal heart size, without pericardial effusion. No central pulmonary embolism, on this non-dedicated study. No mediastinal or hilar adenopathy.  Upper Abdomen: Mild cirrhosis. Scarred kidneys bilaterally. Normal adrenal glands. Atherosclerosis at the origin of bilateral renal arteries.  Bones/Musculoskeletal: Surgical defects of anterior right ribs. Sclerosis in the anterolateral right fourth rib is presumably due to postsurgical healing. Mild superior inferior endplate irregularity at T9 is unchanged.  IMPRESSION: 1. Surgical changes of right upper lobectomy, without locally recurrent or metastatic disease. 2. Centrilobular emphysema with scattered smaller pulmonary nodules. The majority of these are similar. Right lower lobe and left apical nodules may be new/ enlarged as detailed above. These warrant followup attention. 3. Mild cirrhosis. 4. Similar mild T9 compression deformity.   Electronically Signed   By: Abigail Miyamoto M.D.    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  Doing well postoperatively without limitations Completed Adjuvant chemotherapy with systemic chemotherapy in the form of cisplatin at 75 mg per meter squared and Alimta 500 mg per meter squared given every 3 weeks for a total of 4 cycles Plan to see back in 6 month, because of the question of 3 mm lung nodules Dr. Julien Nordmann has arranged for a CT scan in 3 months. I will plan to review that and see the patient back in 6 months or sooner as necessary.  Meghan Isaac MD      [address] [place] [address] Office [phone]   Beeper [phone]

## 2014-01-27 ENCOUNTER — Telehealth: Payer: Self-pay | Admitting: *Deleted

## 2014-01-27 NOTE — Telephone Encounter (Signed)
Pt called wanting to know whether Dr Vista Mink recommended pt getting a colonoscopy.  Per PCP is recommending it and she wanted to see what Dr Vista Mink thinks.  Per Dr Vista Mink, he recommends she have a colonoscopy done.  SLJ

## 2014-02-24 ENCOUNTER — Other Ambulatory Visit (HOSPITAL_BASED_OUTPATIENT_CLINIC_OR_DEPARTMENT_OTHER): Payer: Medicare Other

## 2014-02-24 ENCOUNTER — Encounter (HOSPITAL_COMMUNITY): Payer: Self-pay

## 2014-02-24 ENCOUNTER — Ambulatory Visit (HOSPITAL_COMMUNITY)
Admission: RE | Admit: 2014-02-24 | Discharge: 2014-02-24 | Disposition: A | Discharge status: Home / Self Care | Payer: Medicare Other | Source: Ambulatory Visit | Attending: Internal Medicine | Admitting: Internal Medicine

## 2014-02-24 DIAGNOSIS — N289 Disorder of kidney and ureter, unspecified: Secondary | ICD-10-CM | POA: Insufficient documentation

## 2014-02-24 DIAGNOSIS — K228 Other specified diseases of esophagus: Secondary | ICD-10-CM | POA: Insufficient documentation

## 2014-02-24 DIAGNOSIS — K224 Dyskinesia of esophagus: Secondary | ICD-10-CM | POA: Insufficient documentation

## 2014-02-24 DIAGNOSIS — Z9221 Personal history of antineoplastic chemotherapy: Secondary | ICD-10-CM | POA: Insufficient documentation

## 2014-02-24 DIAGNOSIS — C3410 Malignant neoplasm of upper lobe, unspecified bronchus or lung: Secondary | ICD-10-CM

## 2014-02-24 DIAGNOSIS — Z902 Acquired absence of lung [part of]: Secondary | ICD-10-CM | POA: Diagnosis not present

## 2014-02-24 DIAGNOSIS — K2289 Other specified disease of esophagus: Secondary | ICD-10-CM | POA: Insufficient documentation

## 2014-02-24 DIAGNOSIS — C3490 Malignant neoplasm of unspecified part of unspecified bronchus or lung: Secondary | ICD-10-CM | POA: Diagnosis present

## 2014-02-24 DIAGNOSIS — M479 Spondylosis, unspecified: Secondary | ICD-10-CM | POA: Insufficient documentation

## 2014-02-24 DIAGNOSIS — J438 Other emphysema: Secondary | ICD-10-CM | POA: Diagnosis not present

## 2014-02-24 DIAGNOSIS — J984 Other disorders of lung: Secondary | ICD-10-CM | POA: Insufficient documentation

## 2014-02-24 LAB — COMPREHENSIVE METABOLIC PANEL (CC13)
ALK PHOS: 85 U/L (ref 40–150)
ALT: 21 U/L (ref 0–55)
AST: 29 U/L (ref 5–34)
Albumin: 4.1 g/dL (ref 3.5–5.0)
Anion Gap: 11 mEq/L (ref 3–11)
BUN: 20.4 mg/dL (ref 7.0–26.0)
CO2: 31 mEq/L — ABNORMAL HIGH (ref 22–29)
Calcium: 9.7 mg/dL (ref 8.4–10.4)
Chloride: 93 mEq/L — ABNORMAL LOW (ref 98–109)
Creatinine: 1.2 mg/dL — ABNORMAL HIGH (ref 0.6–1.1)
GLUCOSE: 93 mg/dL (ref 70–140)
Potassium: 3.6 mEq/L (ref 3.5–5.1)
Sodium: 135 mEq/L — ABNORMAL LOW (ref 136–145)
Total Bilirubin: 0.37 mg/dL (ref 0.20–1.20)
Total Protein: 7 g/dL (ref 6.4–8.3)

## 2014-02-24 LAB — CBC WITH DIFFERENTIAL/PLATELET
BASO%: 0.2 % (ref 0.0–2.0)
BASOS ABS: 0 10*3/uL (ref 0.0–0.1)
EOS%: 4.5 % (ref 0.0–7.0)
Eosinophils Absolute: 0.2 10*3/uL (ref 0.0–0.5)
HCT: 36 % (ref 34.8–46.6)
HEMOGLOBIN: 12.9 g/dL (ref 11.6–15.9)
LYMPH%: 54 % — AB (ref 14.0–49.7)
MCH: 33.9 pg (ref 25.1–34.0)
MCHC: 35.8 g/dL (ref 31.5–36.0)
MCV: 94.5 fL (ref 79.5–101.0)
MONO#: 0.3 10*3/uL (ref 0.1–0.9)
MONO%: 6.9 % (ref 0.0–14.0)
NEUT#: 1.4 10*3/uL — ABNORMAL LOW (ref 1.5–6.5)
NEUT%: 34.4 % — ABNORMAL LOW (ref 38.4–76.8)
PLATELETS: 203 10*3/uL (ref 145–400)
RBC: 3.81 10*6/uL (ref 3.70–5.45)
RDW: 11.6 % (ref 11.2–14.5)
WBC: 4.2 10*3/uL (ref 3.9–10.3)
lymph#: 2.3 10*3/uL (ref 0.9–3.3)

## 2014-02-24 MED ORDER — IOHEXOL 300 MG/ML  SOLN
80.0000 mL | Freq: Once | INTRAMUSCULAR | Status: AC | PRN
  Administered 2014-02-24: 80 mL via INTRAVENOUS

## 2014-03-03 ENCOUNTER — Encounter: Payer: Self-pay | Admitting: Internal Medicine

## 2014-03-03 ENCOUNTER — Telehealth: Payer: Self-pay | Admitting: Internal Medicine

## 2014-03-03 ENCOUNTER — Ambulatory Visit (HOSPITAL_BASED_OUTPATIENT_CLINIC_OR_DEPARTMENT_OTHER): Payer: Medicare Other | Admitting: Internal Medicine

## 2014-03-03 VITALS — BP 160/77 | HR 72 | Temp 98.3°F | Resp 18 | Ht 64.0 in | Wt 137.8 lb

## 2014-03-03 DIAGNOSIS — C3410 Malignant neoplasm of upper lobe, unspecified bronchus or lung: Secondary | ICD-10-CM

## 2014-03-03 NOTE — Telephone Encounter (Signed)
gv pt appt schedule for jan 2016. central will call pt w/ct.

## 2014-03-03 NOTE — Progress Notes (Signed)
Portal [phone]   Fax:([phone]) [phone]  OFFICE PROGRESS NOTE  [name], [address], [place] [address]  DIAGNOSIS: Cancer of upper lobe of lung  Primary site: Lung (Right)  Staging method: AJCC 7th Edition  Clinical: (T1a, N0)  Pathologic: Stage IA (T1a, N0, cM0) signed by [name], MD on 07/09/2013 1:58 PM  Summary: Stage IA (T1a, N0, cM0)  Lung cancer, Right upper lobe  Primary site: Lung (Right)  Pathologic: Stage IB (T2a, N0, cM0) signed by [name], MD on 07/09/2013 1:54 PM  Summary: Stage IB (T2a, N0, cM0)   PRIOR THERAPY:  1) Status post right video-assisted thoracoscopy, wedge resection, right upper lobe with completion of right upper lobectomy with lymph node dissection under the care of Dr. Servando Snare on 07/08/2013. 2) Adjuvant chemotherapy with systemic chemotherapy in the form of cisplatin at 75 mg per meter squared and Alimta 500 mg per meter squared given every 3 weeks for a total of 4 cycles, status post 4 cycles. Last cycle was given 10/23/2013.  CURRENT THERAPY: Observation.  CHEMOTHERAPY INTENT: Curative  CURRENT # OF CHEMOTHERAPY CYCLES: 0 CURRENT ANTIEMETICS: Aloxi, dexamethasone, Compazine  CURRENT SMOKING STATUS: Former smoker, quit 08/08/1978  ORAL CHEMOTHERAPY AND CONSENT: n/a  CURRENT BISPHOSPHONATES USE: none  PAIN MANAGEMENT: Percocent  NARCOTICS INDUCED CONSTIPATION: none  LIVING WILL AND CODE STATUS: ?    INTERVAL HISTORY: [name] 68 y.o. female returns to the clinic today for followup visit accompanied by her husband. She has been on the patient was lost few months and doing very well. The patient is feeling fine today with no specific complaints.  The patient denied having any nausea or vomiting. She has no fever or chills. She has no significant chest pain, shortness of breath, cough or hemoptysis. She has repeat CT scan of the chest performed recently and she  is here for evaluation and discussion of her scan results.  MEDICAL HISTORY: Past Medical History  Diagnosis Date  . Hyperlipidemia     takes Zocor daily  . Cavitating mass in right upper lung lobe   . Lung nodules     right upper lobe  . GERD (gastroesophageal reflux disease)     takes Nexium daily  . Hypertension     takes Amlodipine daily and Chlorthalidone as well  . Dizziness     occasionally   . Arthritis     back   . History of colon polyps   . Diverticulosis   . Family history of kidney infection     pt history-saw Dr.Wrenn  . Nocturia   . Lung cancer, Right upper lobe     ALLERGIES:  has No Known Allergies.  MEDICATIONS:  Current Outpatient Prescriptions  Medication Sig Dispense Refill  . amLODipine (NORVASC) 5 MG tablet Take 5 mg by mouth daily.      . chlorthalidone (HYGROTON) 25 MG tablet Take 25 mg by mouth daily.      . Cholecalciferol (VITAMIN D-3 PO) Take 1 tablet by mouth 2 (two) times a week.       . esomeprazole (NEXIUM) 40 MG capsule Take 40 mg by mouth daily before breakfast.      . folic acid (FOLVITE) 1 MG tablet TAKE 1 TABLET (1 MG TOTAL) BY MOUTH DAILY.  30 tablet  3  . Multiple Vitamins-Minerals (PRESERVISION AREDS PO) Take 1 tablet by mouth daily.       . simvastatin (  ZOCOR) 20 MG tablet Take 20 mg by mouth every evening.       No current facility-administered medications for this visit.    SURGICAL HISTORY:  Past Surgical History  Procedure Laterality Date  . Cardiovascular stress test  06/29/2009    EF 79%, NO ISCHEMIA  . Tracheostomy      at age 58 or 5 d/t polyps on vocal cord  . Abdominal hysterectomy      at age 32;partial   . Colonoscopy    . Video bronchoscopy N/A 07/08/2013    Procedure: VIDEO BRONCHOSCOPY;  Surgeon: [name], MD;  Location: [facility];  Service: Thoracic;  Laterality: N/A;  . Video assisted thoracoscopy (vats)/wedge resection Right 07/08/2013    Procedure: VIDEO ASSISTED THORACOSCOPY (VATS)/WEDGE RESECTION;   Surgeon: [name], MD;  Location: [facility];  Service: Thoracic;  Laterality: Right;    REVIEW OF SYSTEMS:  Constitutional: negative Eyes: negative Ears, nose, mouth, throat, and face: negative Respiratory: negative Cardiovascular: negative Gastrointestinal: negative Genitourinary:negative Integument/breast: negative Hematologic/lymphatic: negative Musculoskeletal:negative Neurological: negative Behavioral/Psych: positive for sleep disturbance Endocrine: negative Allergic/Immunologic: negative   PHYSICAL EXAMINATION: General appearance: alert, cooperative and no distress Head: Normocephalic, without obvious abnormality, atraumatic Neck: no adenopathy, no JVD, supple, symmetrical, trachea midline and thyroid not enlarged, symmetric, no tenderness/mass/nodules Lymph nodes: Cervical, supraclavicular, and axillary nodes normal. Resp: clear to auscultation bilaterally Back: symmetric, no curvature. ROM normal. No CVA tenderness. Cardio: regular rate and rhythm, S1, S2 normal, no murmur, click, rub or gallop GI: soft, non-tender; bowel sounds normal; no masses,  no organomegaly Extremities: extremities normal, atraumatic, no cyanosis or edema  ECOG PERFORMANCE STATUS: 1 - Symptomatic but completely ambulatory  Blood pressure 160/77, pulse 72, temperature 98.3 F (36.8 C), temperature source Oral, resp. rate 18, height 5\' 4"  (1.626 m), weight 137 lb 12.8 oz (62.506 kg).  LABORATORY DATA: Lab Results  Component Value Date   WBC 4.2 02/24/2014   HGB 12.9 02/24/2014   HCT 36.0 02/24/2014   MCV 94.5 02/24/2014   PLT 203 02/24/2014      Chemistry      Component Value Date/Time   NA 135* 02/24/2014 0944   NA 134* 07/14/2013 0452   K 3.6 02/24/2014 0944   K 3.5 07/14/2013 0452   CL 93* 07/14/2013 0452   CO2 31* 02/24/2014 0944   CO2 32 07/14/2013 0452   BUN 20.4 02/24/2014 0944   BUN 18 07/14/2013 0452   CREATININE 1.2* 02/24/2014 0944   CREATININE 0.87 07/14/2013 0452      Component  Value Date/Time   CALCIUM 9.7 02/24/2014 0944   CALCIUM 9.4 07/14/2013 0452   ALKPHOS 85 02/24/2014 0944   ALKPHOS 89 07/10/2013 0400   AST 29 02/24/2014 0944   AST 31 07/10/2013 0400   ALT 21 02/24/2014 0944   ALT 21 07/10/2013 0400   BILITOT 0.37 02/24/2014 0944   BILITOT 0.3 07/10/2013 0400       RADIOGRAPHIC STUDIES: Ct Chest W Contrast  02/24/2014   CLINICAL DATA:  Lung cancer, chemotherapy complete.  EXAM: CT CHEST WITH CONTRAST  TECHNIQUE: Multidetector CT imaging of the chest was performed during intravenous contrast administration.  CONTRAST:  45mL OMNIPAQUE IOHEXOL 300 MG/ML  SOLN  COMPARISON:  11/20/2013 and 06/19/2013.  FINDINGS: No pathologically enlarged mediastinal, hilar or axillary lymph nodes. Heart size normal. No pericardial effusion. Esophageal dilatation can be seen with dysmotility.  Postoperative changes of right upper lobectomy with scarring and volume loss in the right hemi  thorax. Mild centrilobular emphysema. Left lower lobe nodules measure up to 4 mm (series 5, image 38), stable from 06/19/2013. No pleural fluid. Airway is otherwise unremarkable.  Incidental imaging of the upper abdomen shows irregularity of the liver margin. Visualized portions of the liver and adrenal glands are otherwise unremarkable. Renal cortical scarring bilaterally. Visualized portions of the spleen, pancreas, stomach and bowel are grossly unremarkable. No upper abdominal adenopathy. No worrisome lytic or sclerotic lesions. Degenerative changes are seen in the spine. Thoracotomy changes on the right. Mild compression of a lower thoracic vertebral body, unchanged.  IMPRESSION: 1. Postoperative changes of right upper lobectomy without evidence of metastatic disease. 2. Left lower lobe nodules are stable from 06/19/2013. 3. Marginal irregularity of the liver is indicative of cirrhosis.   Electronically Signed   By: Lorin Picket M.D.   On: 02/24/2014 12:19   ASSESSMENT AND PLAN: This is a very pleasant 68  years old white female with synchronous non-small cell lung cancer presenting with stage IA as well as a stage IB status post right upper lobectomy with lymph node dissection and currently undergoing adjuvant chemotherapy with cisplatin and Alimta status post 4 cycles. The patient is tolerating her treatment fairly well with no significant adverse effects. The recent CT scan of the chest showed no evidence for disease progression. I discussed the scan results with the patient and her husband today. I recommended for her to continue on observation with repeat CT scan of the chest in 6 months for reevaluation of her disease. She was advised to call immediately she has any concerning symptoms in the interval. The patient voices understanding of current disease status and treatment options and is in agreement with the current care plan.  All questions were answered. The patient knows to call the clinic with any problems, questions or concerns. We can certainly see the patient much sooner if necessary.  Disclaimer: This note was dictated with voice recognition software. Similar sounding words can inadvertently be transcribed and may not be corrected upon review.

## 2014-03-19 ENCOUNTER — Other Ambulatory Visit: Payer: Self-pay | Admitting: Internal Medicine

## 2014-03-19 DIAGNOSIS — C3410 Malignant neoplasm of upper lobe, unspecified bronchus or lung: Secondary | ICD-10-CM

## 2014-06-05 ENCOUNTER — Encounter: Payer: Self-pay | Admitting: Cardiothoracic Surgery

## 2014-06-05 ENCOUNTER — Ambulatory Visit (INDEPENDENT_AMBULATORY_CARE_PROVIDER_SITE_OTHER): Payer: Medicare Other | Admitting: Cardiothoracic Surgery

## 2014-06-05 VITALS — BP 154/82 | HR 77 | Ht 64.0 in | Wt 141.0 lb

## 2014-06-05 DIAGNOSIS — Z902 Acquired absence of lung [part of]: Secondary | ICD-10-CM

## 2014-06-05 DIAGNOSIS — Z9889 Other specified postprocedural states: Secondary | ICD-10-CM

## 2014-06-05 DIAGNOSIS — C3411 Malignant neoplasm of upper lobe, right bronchus or lung: Secondary | ICD-10-CM

## 2014-06-05 NOTE — Progress Notes (Signed)
[address]       [place] [address]             [phone]                     [name] [facility] Record #[id number] Date of Birth: [birth year]  Meghan Dy, MD Meghan Jacobson, MD  Chief Complaint:   PostOp Follow Up Visit 07/08/2013  OPERATIVE REPORT  PREOPERATIVE DIAGNOSIS: Right upper lobe lung mass.  POSTOPERATIVE DIAGNOSIS: Carcinoma of the right upper lobe.  PROCEDURE PERFORMED: Bronchoscopy, right video-assisted thoracoscopy,  wedge resection, right upper lobe, with completion of right upper  lobectomy, lymph node dissection, and placement of On-Q device.  SURGEON: Meghan Bal, MD    Adjuvant chemotherapy with systemic chemotherapy in the form of cisplatin at 75 mg per meter squared and Alimta 500 mg per meter squared given every 3 weeks for a total of 4 cycles, status post 4 cycles. Last cycle was given 10/23/2013.   PATH: ALK EGFR negative , [facility]  mRNA testing completed see scanned document Diagnosis 1. 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 - PERINEURAL INVASION IS IDENTIFIED. - THE SURGICAL RESECTION MARGINS ARE NEGATIVE FOR ADENOCARCINOMA. - SEE ONCOLOGY TABLE BELOW. 2. 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 - ATYPICAL ADENOMATOUS HYPERPLASIA. - THERE IS NO EVIDENCE OF CARCINOMA IN 2 OF 2 LYMPH NODES (0/2). - THE SURGICAL RESECTION MARGINS ARE NEGATIVE FOR CARCINOMA. - SEE ONCOLOGY TABLE BELOW. 3. Lymph node, biopsy, 10 R - THERE IS NO EVIDENCE OF CARCINOMA IN 1 OF 1 LYMPH NODE (0/1). 1 of 4 FINAL for [name], [name] [phone]) Diagnosis(continued) 4. Lymph node, biopsy, 11 R - THERE IS NO EVIDENCE OF CARCINOMA IN 1 OF 1 LYMPH NODE (0/1). 5. Lymph node, biopsy, 12 R - THERE IS NO EVIDENCE OF CARCINOMA IN 1 OF 1 LYMPH NODE  (0/1) 6. Lymph node, biopsy, 13 R - THERE IS NO EVIDENCE OF CARCINOMA IN 1 OF 1 LYMPH NODE (0/1). 7. Lymph node, biopsy, 4 R - THERE IS NO EVIDENCE OF CARCINOMA IN 1 OF 1 LYMPH NODE (0/1). Microscopic Comment 1. LUNG (Part 1) Specimen, including laterality: Right upper lobe Procedure: Wedge resection followed by completion lobectomy Specimen integrity (intact/disrupted): Intact Tumor site: Right upper lobe Tumor focality: Adenocarcinoma is unifocal Maximum tumor size (cm): 4.3 cm (gross measurement) Histologic type: Adenocarcinoma. Grade: Well differentiated (low grade Margins: Negative for adenocarcinoma Distance to closest margin (cm): 0.8 cm to the nearest stapled margin (gross measurement) Visceral pleura invasion: Not identified Tumor extension: Confined to lung parenchyma Treatment effect (if treated with neoadjuvant therapy): N/A Lymph -Vascular invasion: Not identified Lymph nodes: Number examined - 7; Number N1 nodes positive 0; Number N2 nodes positive 0 TNM code: pT2a, pN0 Ancillary studies: A block will be sent for EGFR and ALK studies and the results reported separately. Comments: The vast majority of the adenocarcinoma has a lepidic growth pattern (so called in-situ adenocarcinoma). However, there are a few small foci of stromal invasion identified. 2. LUNG (Part 2) Specimen, including laterality: Right upper lobe Procedure: Completion lobectomy Specimen integrity (intact/disrupted): Intact Tumor site: Perihilar Tumor focality: Squamous cell carcinoma is unifocal Maximum tumor size (cm): 1.6 cm (gross measurement) Histologic type: Squamous cell carcinoma Grade: Well differentiated (low grade) Margins: Negative for carcinoma Distance to closest margin (  cm): 1.5 cm to the bronchial resection margin (gross measurement) Visceral pleura invasion: Not identified. Tumor extension: Confined to lung parenchyma Treatment effect (if treated with neoadjuvant therapy):  N/A Lymph -Vascular invasion: Not identified Lymph nodes: Number examined - 7; Number N1 nodes positive 0; Number N2 nodes positive 0 TNM code: pT1a, pN0 Ancillary studies: N/A Comments: The squamous cell carcinoma is predominantly in-situ. However, there is a single focus suspicious for early invasion.   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 She is making good progress postoperatively. Increasing her physical activity appropriately level.  She denies any significant shortness of breath. Has had no fever chills. Her incisions are healing well.    Zubrod Score: At the time of surgery this patient's most appropriate activity status/level should be described as: $RemoveBefor'[x]'YQzNwGuDreKX$     0    Normal activity, no symptoms $RemoveBef'[]'GdcMGJuMVX$     1    Restricted in physical strenuous activity but ambulatory, able to do out light work $RemoveBe'[]'BwNlUdSRu$     2    Ambulatory and capable of self care, unable to do work activities, up and about >50 % of waking hours                              '[]'$     3    Only limited self care, in bed greater than 50% of waking hours $RemoveBefo'[]'VSsKcSryfCT$     4    Completely disabled, no self care, confined to bed or chair $Remove'[]'pYcNKwA$     5    Moribund   History  Smoking status  . Former Smoker  . Types: Cigarettes  . Quit date: 08/08/1978  Smokeless tobacco  . Never Used    Comment: quit 35 yrs ago       No Known Allergies  Current Outpatient Prescriptions  Medication Sig Dispense Refill  . amLODipine (NORVASC) 5 MG tablet Take 5 mg by mouth daily.      . chlorthalidone (HYGROTON) 25 MG tablet Take 25 mg by mouth daily.      . Cholecalciferol (VITAMIN D-3 PO) Take 1 tablet by mouth 2 (two) times a week.       . esomeprazole (NEXIUM) 40 MG capsule Take 40 mg by mouth daily before breakfast.      . folic acid (FOLVITE) 1 MG tablet TAKE 1  TABLET (1 MG TOTAL) BY MOUTH DAILY.  30 tablet  3  . Multiple Vitamins-Minerals (PRESERVISION AREDS PO) Take 1 tablet by mouth daily.       Marland Kitchen oxyCODONE-acetaminophen (PERCOCET/ROXICET) 5-325 MG per tablet Take 1-2 tablets by mouth every 8 (eight) hours as needed for severe pain.  30 tablet  0  . simvastatin (ZOCOR) 20 MG tablet Take 20 mg by mouth every evening.           Physical Exam: BP 154/82  Pulse 77  Ht $R'5\' 4"'NX$  (1.626 m)  Wt 141 lb (63.957 kg)  BMI 24.19 kg/m2  SpO2 92%  General appearance: alert and cooperative Neurologic: intact Heart: regular rate and rhythm, S1, S2 normal, no murmur, click, rub or gallop Lungs: clear to auscultation bilaterally Abdomen: soft, non-tender; bowel sounds normal; no masses,  no organomegaly Extremities: extremities normal, atraumatic, no cyanosis or edema and Homans  sign is negative, no sign of DVT Wound: Right chest port sites/incisions healing well, sutures removed Patient has no cervical or supraclavicular adenopathy  Diagnostic Studies & Laboratory data:         Recent Radiology Findings: EXAM:  02/2014  CT CHEST WITH CONTRAST  TECHNIQUE:  Multidetector CT imaging of the chest was performed during  intravenous contrast administration.  CONTRAST: 58mL OMNIPAQUE IOHEXOL 300 MG/ML SOLN  COMPARISON: 11/20/2013 and 06/19/2013.  FINDINGS:  No pathologically enlarged mediastinal, hilar or axillary lymph  nodes. Heart size normal. No pericardial effusion. Esophageal  dilatation can be seen with dysmotility.  Postoperative changes of right upper lobectomy with scarring and  volume loss in the right hemi thorax. Mild centrilobular emphysema.  Left lower lobe nodules measure up to 4 mm (series 5, image 38),  stable from 06/19/2013. No pleural fluid. Airway is otherwise  unremarkable.  Incidental imaging of the upper abdomen shows irregularity of the  liver margin. Visualized portions of the liver and adrenal glands  are otherwise  unremarkable. Renal cortical scarring bilaterally.  Visualized portions of the spleen, pancreas, stomach and bowel are  grossly unremarkable. No upper abdominal adenopathy. No worrisome  lytic or sclerotic lesions. Degenerative changes are seen in the  spine. Thoracotomy changes on the right. Mild compression of a lower  thoracic vertebral body, unchanged.  IMPRESSION:  1. Postoperative changes of right upper lobectomy without evidence  of metastatic disease.  2. Left lower lobe nodules are stable from 06/19/2013.  3. Marginal irregularity of the liver is indicative of cirrhosis.  Electronically Signed  By: Lorin Picket M.D.  On: 02/24/2014 12:19  Ct Chest W Contrast  11/20/2013   CLINICAL DATA:  Restaging of non-small-cell lung cancer. Diagnosed 2014. Chemotherapy ongoing. Right lung surgery. Hysterectomy.  EXAM: CT CHEST WITH CONTRAST  TECHNIQUE: Multidetector CT imaging of the chest was performed during intravenous contrast administration.  CONTRAST:  21mL OMNIPAQUE IOHEXOL 300 MG/ML  SOLN  COMPARISON:  Plain film 08/29/2013. PET 07/01/2013. Chest CT 06/19/2013.  FINDINGS: Lungs/Pleura: Status post right upper lobectomy. Mild centrilobular emphysema. High right upper lobe ground-glass nodule measures 6 mm on image 13. This may represent the 3 mm ground-glass nodule on image 23 of the prior exam. Possibly mildly enlarged.  4 mm left lower lobe lung nodule on image 38 which is similar to on the prior exam. Immediately cephalad vague 2 mm nodule on image 35 is also felt to be similar.  5 mm ground-glass opacity in the superior segment left lower lobe on image 26 is unchanged.  A left upper lobe 2 mm nodule on image 18 is not readily apparent on the prior.  No pleural fluid.  Heart/Mediastinum: No supraclavicular adenopathy. Aortic and branch vessel atherosclerosis. Normal heart size, without pericardial effusion. No central pulmonary embolism, on this non-dedicated study. No mediastinal or hilar  adenopathy.  Upper Abdomen: Mild cirrhosis. Scarred kidneys bilaterally. Normal adrenal glands. Atherosclerosis at the origin of bilateral renal arteries.  Bones/Musculoskeletal: Surgical defects of anterior right ribs. Sclerosis in the anterolateral right fourth rib is presumably due to postsurgical healing. Mild superior inferior endplate irregularity at T9 is unchanged.  IMPRESSION: 1. Surgical changes of right upper lobectomy, without locally recurrent or metastatic disease. 2. Centrilobular emphysema with scattered smaller pulmonary nodules. The majority of these are similar. Right lower lobe and left apical nodules may be new/ enlarged as detailed above. These warrant followup attention. 3. Mild cirrhosis. 4. Similar mild T9 compression deformity.   Electronically Signed  By: Abigail Miyamoto M.D.   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  Doing well postoperatively without limitations Completed Adjuvant chemotherapy with systemic chemotherapy in the form of cisplatin at 75 mg per meter squared and Alimta 500 mg per meter squared given every 3 weeks for a total of 4 cycles Plan to see back in 8 month,  Medical oncology has ordered follow-up CT scan  [name] MD      [address] [place] [address] Office [phone]   Beeper [phone]

## 2014-06-20 ENCOUNTER — Other Ambulatory Visit: Payer: Self-pay

## 2014-06-20 DIAGNOSIS — R5381 Other malaise: Secondary | ICD-10-CM

## 2014-06-25 ENCOUNTER — Other Ambulatory Visit: Payer: Self-pay

## 2014-06-25 DIAGNOSIS — M810 Age-related osteoporosis without current pathological fracture: Secondary | ICD-10-CM

## 2014-06-25 DIAGNOSIS — Z1231 Encounter for screening mammogram for malignant neoplasm of breast: Secondary | ICD-10-CM

## 2014-07-18 ENCOUNTER — Other Ambulatory Visit: Payer: Medicare Other

## 2014-07-18 ENCOUNTER — Ambulatory Visit: Payer: Medicare Other

## 2014-08-28 ENCOUNTER — Telehealth: Payer: Self-pay | Admitting: Medical Oncology

## 2014-08-28 ENCOUNTER — Other Ambulatory Visit (HOSPITAL_BASED_OUTPATIENT_CLINIC_OR_DEPARTMENT_OTHER): Payer: Medicare Other

## 2014-08-28 ENCOUNTER — Ambulatory Visit (HOSPITAL_COMMUNITY)
Admission: RE | Admit: 2014-08-28 | Discharge: 2014-08-28 | Disposition: A | Discharge status: Home / Self Care | Payer: Medicare Other | Source: Ambulatory Visit | Attending: Internal Medicine | Admitting: Internal Medicine

## 2014-08-28 DIAGNOSIS — Z9221 Personal history of antineoplastic chemotherapy: Secondary | ICD-10-CM | POA: Diagnosis not present

## 2014-08-28 DIAGNOSIS — Z902 Acquired absence of lung [part of]: Secondary | ICD-10-CM | POA: Insufficient documentation

## 2014-08-28 DIAGNOSIS — K7460 Unspecified cirrhosis of liver: Secondary | ICD-10-CM | POA: Insufficient documentation

## 2014-08-28 DIAGNOSIS — Z08 Encounter for follow-up examination after completed treatment for malignant neoplasm: Secondary | ICD-10-CM | POA: Diagnosis present

## 2014-08-28 DIAGNOSIS — C3410 Malignant neoplasm of upper lobe, unspecified bronchus or lung: Secondary | ICD-10-CM

## 2014-08-28 DIAGNOSIS — E876 Hypokalemia: Secondary | ICD-10-CM

## 2014-08-28 DIAGNOSIS — R918 Other nonspecific abnormal finding of lung field: Secondary | ICD-10-CM | POA: Diagnosis not present

## 2014-08-28 LAB — COMPREHENSIVE METABOLIC PANEL (CC13)
ALBUMIN: 4.5 g/dL (ref 3.5–5.0)
ALT: 21 U/L (ref 0–55)
AST: 30 U/L (ref 5–34)
Alkaline Phosphatase: 88 U/L (ref 40–150)
Anion Gap: 12 mEq/L — ABNORMAL HIGH (ref 3–11)
BUN: 20.3 mg/dL (ref 7.0–26.0)
CALCIUM: 9.5 mg/dL (ref 8.4–10.4)
CHLORIDE: 94 meq/L — AB (ref 98–109)
CO2: 31 meq/L — AB (ref 22–29)
Creatinine: 1.4 mg/dL — ABNORMAL HIGH (ref 0.6–1.1)
EGFR: 38 mL/min/{1.73_m2} — AB (ref >90)
GLUCOSE: 104 mg/dL (ref 70–140)
Potassium: 3 mEq/L — CL (ref 3.5–5.1)
Sodium: 137 mEq/L (ref 136–145)
Total Bilirubin: 0.74 mg/dL (ref 0.20–1.20)
Total Protein: 7.2 g/dL (ref 6.4–8.3)

## 2014-08-28 LAB — CBC WITH DIFFERENTIAL/PLATELET
BASO%: 0.5 % (ref 0.0–2.0)
BASOS ABS: 0 10*3/uL (ref 0.0–0.1)
EOS%: 4.3 % (ref 0.0–7.0)
Eosinophils Absolute: 0.3 10*3/uL (ref 0.0–0.5)
HEMATOCRIT: 39.2 % (ref 34.8–46.6)
HGB: 13.7 g/dL (ref 11.6–15.9)
LYMPH%: 40.6 % (ref 14.0–49.7)
MCH: 33.1 pg (ref 25.1–34.0)
MCHC: 34.9 g/dL (ref 31.5–36.0)
MCV: 94.7 fL (ref 79.5–101.0)
MONO#: 0.6 10*3/uL (ref 0.1–0.9)
MONO%: 9.8 % (ref 0.0–14.0)
NEUT#: 2.8 10*3/uL (ref 1.5–6.5)
NEUT%: 44.8 % (ref 38.4–76.8)
Platelets: 205 10*3/uL (ref 145–400)
RBC: 4.14 10*6/uL (ref 3.70–5.45)
RDW: 12.4 % (ref 11.2–14.5)
WBC: 6.3 10*3/uL (ref 3.9–10.3)
lymph#: 2.6 10*3/uL (ref 0.9–3.3)

## 2014-08-28 MED ORDER — IOHEXOL 300 MG/ML  SOLN
100.0000 mL | Freq: Once | INTRAMUSCULAR | Status: AC | PRN
  Administered 2014-08-28: 60 mL via INTRAVENOUS

## 2014-08-29 MED ORDER — POTASSIUM CHLORIDE CRYS ER 20 MEQ PO TBCR: 20.0000 meq | EXTENDED_RELEASE_TABLET | Freq: Every day | ORAL | Status: DC

## 2014-08-29 NOTE — Telephone Encounter (Signed)
Per Adrena I called in kdur to pts local pharmacy and pt notified.  Dr Mancel Bale notified via inbasket

## 2014-08-29 NOTE — Telephone Encounter (Signed)
Faxed cmp to Dr Mancel Bale

## 2014-08-29 NOTE — Progress Notes (Signed)
Quick Note:  Call patient with the result and order K Dur 20 meq po qd X 7 days ______

## 2014-09-04 ENCOUNTER — Telehealth: Payer: Self-pay | Admitting: Internal Medicine

## 2014-09-04 ENCOUNTER — Encounter: Payer: Self-pay | Admitting: Internal Medicine

## 2014-09-04 ENCOUNTER — Ambulatory Visit (HOSPITAL_BASED_OUTPATIENT_CLINIC_OR_DEPARTMENT_OTHER): Payer: Medicare Other | Admitting: Internal Medicine

## 2014-09-04 VITALS — BP 150/74 | HR 80 | Temp 98.6°F | Resp 18 | Ht 64.0 in | Wt 144.9 lb

## 2014-09-04 DIAGNOSIS — C3411 Malignant neoplasm of upper lobe, right bronchus or lung: Secondary | ICD-10-CM

## 2014-09-04 NOTE — Telephone Encounter (Signed)
Gave avs & calendar for July/August.

## 2014-09-04 NOTE — Progress Notes (Signed)
[facility] [phone]   [phone]  OFFICE PROGRESS NOTE  [name], [place], [place] [address]  DIAGNOSIS: Cancer of upper lobe of lung  Primary site: Lung (Right)  Staging method: AJCC 7th Edition  Clinical: (T1a, N0)  Pathologic: Stage IA (T1a, N0, cM0) signed by [name], MD on 07/09/2013 1:58 PM  Summary: Stage IA (T1a, N0, cM0)  Lung cancer, Right upper lobe  Primary site: Lung (Right)  Pathologic: Stage IB (T2a, N0, cM0) signed by [name], MD on 07/09/2013 1:54 PM  Summary: Stage IB (T2a, N0, cM0)   PRIOR THERAPY:  1) Status post right video-assisted thoracoscopy, wedge resection, right upper lobe with completion of right upper lobectomy with lymph node dissection under the care of Dr. Servando Snare on 07/08/2013. 2) Adjuvant chemotherapy with systemic chemotherapy in the form of cisplatin at 75 mg per meter squared and Alimta 500 mg per meter squared given every 3 weeks for a total of 4 cycles, status post 4 cycles. Last cycle was given 10/23/2013.  CURRENT THERAPY: Observation.  CHEMOTHERAPY INTENT: Curative  CURRENT # OF CHEMOTHERAPY CYCLES: 0 CURRENT ANTIEMETICS: Aloxi, dexamethasone, Compazine  CURRENT SMOKING STATUS: Former smoker, quit 08/08/1978  ORAL CHEMOTHERAPY AND CONSENT: n/a  CURRENT BISPHOSPHONATES USE: none  PAIN MANAGEMENT: Percocent  NARCOTICS INDUCED CONSTIPATION: none  LIVING WILL AND CODE STATUS: ?    INTERVAL HISTORY: [name] 69 y.o. female returns to the clinic today for followup visit accompanied by her husband. The patient is feeling fine today with no specific complaints. The patient denied having any nausea or vomiting. She has no fever or chills. She has no significant chest pain, shortness of breath, cough or hemoptysis. She has repeat CT scan of the chest performed recently and she is here for evaluation and discussion of her scan results.  MEDICAL  HISTORY: Past Medical History  Diagnosis Date  . Hyperlipidemia     takes Zocor daily  . Cavitating mass in right upper lung lobe   . Lung nodules     right upper lobe  . GERD (gastroesophageal reflux disease)     takes Nexium daily  . Hypertension     takes Amlodipine daily and Chlorthalidone as well  . Dizziness     occasionally   . Arthritis     back   . History of colon polyps   . Diverticulosis   . Family history of kidney infection     pt history-saw Dr.Wrenn  . Nocturia   . Lung cancer, Right upper lobe     ALLERGIES:  has No Known Allergies.  MEDICATIONS:  Current Outpatient Prescriptions  Medication Sig Dispense Refill  . amLODipine (NORVASC) 5 MG tablet Take 5 mg by mouth daily.    . chlorthalidone (HYGROTON) 25 MG tablet Take 25 mg by mouth daily.    . Cholecalciferol (VITAMIN D-3 PO) Take 1 tablet by mouth 2 (two) times a week.     . esomeprazole (NEXIUM) 40 MG capsule Take 40 mg by mouth daily before breakfast.    . folic acid (FOLVITE) 1 MG tablet TAKE 1 TABLET (1 MG TOTAL) BY MOUTH DAILY. 30 tablet 5  . Multiple Vitamins-Minerals (PRESERVISION AREDS PO) Take 1 tablet by mouth daily.     . potassium chloride SA (K-DUR,KLOR-CON) 20 MEQ tablet Take 1 tablet (20 mEq total) by mouth daily. 7 tablet 0  . simvastatin (ZOCOR) 20 MG tablet Take 20  mg by mouth every evening.     No current facility-administered medications for this visit.    SURGICAL HISTORY:  Past Surgical History  Procedure Laterality Date  . Cardiovascular stress test  06/29/2009    EF 79%, NO ISCHEMIA  . Tracheostomy      at age 22 or 5 d/t polyps on vocal cord  . Abdominal hysterectomy      at age 49;partial   . Colonoscopy    . Video bronchoscopy N/A 07/08/2013    Procedure: VIDEO BRONCHOSCOPY;  Surgeon: [name], MD;  Location: [facility] OR;  Service: Thoracic;  Laterality: N/A;  . Video assisted thoracoscopy (vats)/wedge resection Right 07/08/2013    Procedure: VIDEO ASSISTED  THORACOSCOPY (VATS)/WEDGE RESECTION;  Surgeon: [name], MD;  Location: [facility];  Service: Thoracic;  Laterality: Right;    REVIEW OF SYSTEMS:  Constitutional: negative Eyes: negative Ears, nose, mouth, throat, and face: negative Respiratory: negative Cardiovascular: negative Gastrointestinal: negative Genitourinary:negative Integument/breast: negative Hematologic/lymphatic: negative Musculoskeletal:negative Neurological: negative Behavioral/Psych: positive for sleep disturbance Endocrine: negative Allergic/Immunologic: negative   PHYSICAL EXAMINATION: General appearance: alert, cooperative and no distress Head: Normocephalic, without obvious abnormality, atraumatic Neck: no adenopathy, no JVD, supple, symmetrical, trachea midline and thyroid not enlarged, symmetric, no tenderness/mass/nodules Lymph nodes: Cervical, supraclavicular, and axillary nodes normal. Resp: clear to auscultation bilaterally Back: symmetric, no curvature. ROM normal. No CVA tenderness. Cardio: regular rate and rhythm, S1, S2 normal, no murmur, click, rub or gallop GI: soft, non-tender; bowel sounds normal; no masses,  no organomegaly Extremities: extremities normal, atraumatic, no cyanosis or edema  ECOG PERFORMANCE STATUS: 1 - Symptomatic but completely ambulatory  Blood pressure 150/74, pulse 80, temperature 98.6 F (37 C), temperature source Oral, resp. rate 18, height 5\' 4"  (1.626 m), weight 144 lb 14.4 oz (65.726 kg), SpO2 98 %.  LABORATORY DATA: Lab Results  Component Value Date   WBC 6.3 08/28/2014   HGB 13.7 08/28/2014   HCT 39.2 08/28/2014   MCV 94.7 08/28/2014   PLT 205 08/28/2014      Chemistry      Component Value Date/Time   NA 137 08/28/2014 0807   NA 134* 07/14/2013 0452   K 3.0* 08/28/2014 0807   K 3.5 07/14/2013 0452   CL 93* 07/14/2013 0452   CO2 31* 08/28/2014 0807   CO2 32 07/14/2013 0452   BUN 20.3 08/28/2014 0807   BUN 18 07/14/2013 0452   CREATININE 1.4*  08/28/2014 0807   CREATININE 0.87 07/14/2013 0452      Component Value Date/Time   CALCIUM 9.5 08/28/2014 0807   CALCIUM 9.4 07/14/2013 0452   ALKPHOS 88 08/28/2014 0807   ALKPHOS 89 07/10/2013 0400   AST 30 08/28/2014 0807   AST 31 07/10/2013 0400   ALT 21 08/28/2014 0807   ALT 21 07/10/2013 0400   BILITOT 0.74 08/28/2014 0807   BILITOT 0.3 07/10/2013 0400       RADIOGRAPHIC STUDIES: Ct Chest W Contrast  08/28/2014   CLINICAL DATA:  Subsequent encounter for restaging of lung cancer. Right upper lobectomy. Chemotherapy complete.  EXAM: CT CHEST WITH CONTRAST  TECHNIQUE: Multidetector CT imaging of the chest was performed during intravenous contrast administration.  CONTRAST:  12mL OMNIPAQUE IOHEXOL 300 MG/ML  SOLN  COMPARISON:  02/2014 CT  FINDINGS: Mediastinum/Nodes: No supraclavicular adenopathy. Aortic atherosclerosis. Normal heart size, without pericardial effusion. No central pulmonary embolism, on this non-dedicated study. No mediastinal or hilar adenopathy. Fluid level within a mildly dilated thoracic esophagus on image 31.  Lungs/Pleura: Surgical changes of right upper lobectomy. Mild centrilobular emphysema.  Minimal left lower lobe nodularity is similar, including on images 37 and 40. Minimal right apical nodularity at 3 mm on image 15 is unchanged back to 11/20/2013. There is also a 3 mm left upper lobe nodule on image 28 which is similar back to 11/20/2013.  No pleural fluid.  Upper abdomen: Mild cirrhosis. Normal imaged portions of the spleen, stomach, pancreas, adrenal glands. Scarred kidneys bilaterally. Abdominal aortic and branch vessel atherosclerosis.  Musculoskeletal: S-shaped thoracolumbar spine curvature.  IMPRESSION: 1. Right upper lobectomy, without recurrent or metastatic disease. 2. Stable scattered pulmonary nodules, favored to be benign. 3. Esophageal air fluid level suggests dysmotility or gastroesophageal reflux. 4. Mild cirrhosis.   Electronically Signed   By: Abigail Miyamoto M.D.   On: 08/28/2014 11:22   ASSESSMENT AND PLAN: This is a very pleasant 69 years old white female with synchronous non-small cell lung cancer presenting with stage IA as well as a stage IB status post right upper lobectomy with lymph node dissection and currently undergoing adjuvant chemotherapy with cisplatin and Alimta status post 4 cycles completed in March 2015. The patient is tolerating her treatment fairly well with no significant adverse effects. The recent CT scan of the chest showed no evidence for disease progression. I discussed the scan results with the patient and her husband today.  I recommended for her to continue on observation with repeat CT scan of the chest in 6 months for reevaluation of her disease. She was advised to call immediately she has any concerning symptoms in the interval. The patient voices understanding of current disease status and treatment options and is in agreement with the current care plan.  All questions were answered. The patient knows to call the clinic with any problems, questions or concerns. We can certainly see the patient much sooner if necessary.  Disclaimer: This note was dictated with voice recognition software. Similar sounding words can inadvertently be transcribed and may not be corrected upon review.

## 2014-09-17 ENCOUNTER — Other Ambulatory Visit: Payer: Self-pay | Admitting: Internal Medicine

## 2015-02-05 ENCOUNTER — Ambulatory Visit: Payer: Medicare Other | Admitting: Cardiothoracic Surgery

## 2015-03-05 ENCOUNTER — Encounter (HOSPITAL_COMMUNITY): Payer: Self-pay

## 2015-03-05 ENCOUNTER — Other Ambulatory Visit (HOSPITAL_BASED_OUTPATIENT_CLINIC_OR_DEPARTMENT_OTHER): Payer: Medicare Other

## 2015-03-05 ENCOUNTER — Ambulatory Visit (HOSPITAL_COMMUNITY)
Admission: RE | Admit: 2015-03-05 | Discharge: 2015-03-05 | Disposition: A | Discharge status: Home / Self Care | Payer: Medicare Other | Source: Ambulatory Visit | Attending: Internal Medicine | Admitting: Internal Medicine

## 2015-03-05 DIAGNOSIS — C3411 Malignant neoplasm of upper lobe, right bronchus or lung: Secondary | ICD-10-CM

## 2015-03-05 DIAGNOSIS — Z85118 Personal history of other malignant neoplasm of bronchus and lung: Secondary | ICD-10-CM | POA: Diagnosis not present

## 2015-03-05 DIAGNOSIS — Z08 Encounter for follow-up examination after completed treatment for malignant neoplasm: Secondary | ICD-10-CM | POA: Insufficient documentation

## 2015-03-05 LAB — COMPREHENSIVE METABOLIC PANEL (CC13)
ALT: 19 U/L (ref 0–55)
ANION GAP: 12 meq/L — AB (ref 3–11)
AST: 29 U/L (ref 5–34)
Albumin: 4.4 g/dL (ref 3.5–5.0)
Alkaline Phosphatase: 76 U/L (ref 40–150)
BUN: 29.3 mg/dL — AB (ref 7.0–26.0)
CHLORIDE: 96 meq/L — AB (ref 98–109)
CO2: 30 meq/L — AB (ref 22–29)
CREATININE: 1.4 mg/dL — AB (ref 0.6–1.1)
Calcium: 10.2 mg/dL (ref 8.4–10.4)
EGFR: 38 mL/min/{1.73_m2} — ABNORMAL LOW (ref >90)
Glucose: 102 mg/dl (ref 70–140)
POTASSIUM: 3.4 meq/L — AB (ref 3.5–5.1)
SODIUM: 138 meq/L (ref 136–145)
TOTAL PROTEIN: 7 g/dL (ref 6.4–8.3)
Total Bilirubin: 0.63 mg/dL (ref 0.20–1.20)

## 2015-03-05 LAB — CBC WITH DIFFERENTIAL/PLATELET
BASO%: 0.7 % (ref 0.0–2.0)
Basophils Absolute: 0 10*3/uL (ref 0.0–0.1)
EOS%: 4 % (ref 0.0–7.0)
Eosinophils Absolute: 0.3 10*3/uL (ref 0.0–0.5)
HCT: 38.7 % (ref 34.8–46.6)
HGB: 13.5 g/dL (ref 11.6–15.9)
LYMPH#: 2.5 10*3/uL (ref 0.9–3.3)
LYMPH%: 35.2 % (ref 14.0–49.7)
MCH: 33.9 pg (ref 25.1–34.0)
MCHC: 34.9 g/dL (ref 31.5–36.0)
MCV: 97.1 fL (ref 79.5–101.0)
MONO#: 0.6 10*3/uL (ref 0.1–0.9)
MONO%: 7.7 % (ref 0.0–14.0)
NEUT%: 52.4 % (ref 38.4–76.8)
NEUTROS ABS: 3.8 10*3/uL (ref 1.5–6.5)
PLATELETS: 237 10*3/uL (ref 145–400)
RBC: 3.98 10*6/uL (ref 3.70–5.45)
RDW: 13.4 % (ref 11.2–14.5)
WBC: 7.2 10*3/uL (ref 3.9–10.3)

## 2015-03-05 MED ORDER — IOHEXOL 300 MG/ML  SOLN
100.0000 mL | Freq: Once | INTRAMUSCULAR | Status: AC | PRN
  Administered 2015-03-05: 60 mL via INTRAVENOUS

## 2015-03-12 ENCOUNTER — Ambulatory Visit (HOSPITAL_BASED_OUTPATIENT_CLINIC_OR_DEPARTMENT_OTHER): Payer: Medicare Other | Admitting: Internal Medicine

## 2015-03-12 ENCOUNTER — Encounter: Payer: Self-pay | Admitting: Cardiothoracic Surgery

## 2015-03-12 ENCOUNTER — Ambulatory Visit (INDEPENDENT_AMBULATORY_CARE_PROVIDER_SITE_OTHER): Payer: Medicare Other | Admitting: Cardiothoracic Surgery

## 2015-03-12 ENCOUNTER — Encounter: Payer: Self-pay | Admitting: Internal Medicine

## 2015-03-12 ENCOUNTER — Telehealth: Payer: Self-pay | Admitting: Internal Medicine

## 2015-03-12 VITALS — BP 160/68 | HR 83 | Temp 98.3°F | Resp 18 | Ht 64.0 in | Wt 142.3 lb

## 2015-03-12 VITALS — BP 170/76 | HR 81 | Resp 16 | Ht 64.0 in | Wt 142.0 lb

## 2015-03-12 DIAGNOSIS — N183 Chronic kidney disease, stage 3 unspecified: Secondary | ICD-10-CM

## 2015-03-12 DIAGNOSIS — Z9889 Other specified postprocedural states: Secondary | ICD-10-CM | POA: Diagnosis not present

## 2015-03-12 DIAGNOSIS — C3411 Malignant neoplasm of upper lobe, right bronchus or lung: Secondary | ICD-10-CM

## 2015-03-12 DIAGNOSIS — Z902 Acquired absence of lung [part of]: Secondary | ICD-10-CM

## 2015-03-12 HISTORY — DX: Chronic kidney disease, stage 3 unspecified: N18.30

## 2015-03-12 NOTE — Telephone Encounter (Signed)
Pt confirmed labs/ov per 08/04 POF, gave pt avs and calendar.... KJ °

## 2015-03-12 NOTE — Progress Notes (Signed)
[facility] [phone]) [phone]   [phone]  OFFICE PROGRESS NOTE  [name], [place], [name] [address]  DIAGNOSIS: Cancer of upper lobe of lung  Primary site: Lung (Right)  Staging method: AJCC 7th Edition  Clinical: (T1a, N0)  Pathologic: Stage IA (T1a, N0, cM0) signed by [name], MD on 07/09/2013 1:58 PM  Summary: Stage IA (T1a, N0, cM0)  Lung cancer, Right upper lobe  Primary site: Lung (Right)  Pathologic: Stage IB (T2a, N0, cM0) signed by [name], MD on 07/09/2013 1:54 PM  Summary: Stage IB (T2a, N0, cM0)   PRIOR THERAPY:  1) Status post right video-assisted thoracoscopy, wedge resection, right upper lobe with completion of right upper lobectomy with lymph node dissection under the care of Dr. Servando Snare on 07/08/2013. 2) Adjuvant chemotherapy with systemic chemotherapy in the form of cisplatin at 75 mg per meter squared and Alimta 500 mg per meter squared given every 3 weeks for a total of 4 cycles, status post 4 cycles. Last cycle was given 10/23/2013.  CURRENT THERAPY: Observation.  CHEMOTHERAPY INTENT: Curative  CURRENT # OF CHEMOTHERAPY CYCLES: 0 CURRENT ANTIEMETICS: Aloxi, dexamethasone, Compazine  CURRENT SMOKING STATUS: Former smoker, quit 08/08/1978  ORAL CHEMOTHERAPY AND CONSENT: n/a  CURRENT BISPHOSPHONATES USE: none  PAIN MANAGEMENT: Percocent  NARCOTICS INDUCED CONSTIPATION: none  LIVING WILL AND CODE STATUS: ?    INTERVAL HISTORY: [name] 69 y.o. female returns to the clinic today for 6 months followup visit accompanied by her husband. The patient is feeling fine today with no specific complaints. The patient denied having any nausea or vomiting. She has no fever or chills. She has no significant chest pain, shortness of breath, cough or hemoptysis. She has repeat CT scan of the chest performed recently and she is here for evaluation and discussion of her scan  results.  MEDICAL HISTORY: Past Medical History  Diagnosis Date  . Hyperlipidemia     takes Zocor daily  . Cavitating mass in right upper lung lobe   . Lung nodules     right upper lobe  . GERD (gastroesophageal reflux disease)     takes Nexium daily  . Hypertension     takes Amlodipine daily and Chlorthalidone as well  . Dizziness     occasionally   . Arthritis     back   . History of colon polyps   . Diverticulosis   . Family history of kidney infection     pt history-saw Dr.Wrenn  . Nocturia   . Lung cancer, Right upper lobe     ALLERGIES:  has No Known Allergies.  MEDICATIONS:  Current Outpatient Prescriptions  Medication Sig Dispense Refill  . alendronate (FOSAMAX) 70 MG tablet 70 mg once a week.   4  . amLODipine (NORVASC) 5 MG tablet Take 5 mg by mouth daily.    . chlorthalidone (HYGROTON) 25 MG tablet Take 25 mg by mouth daily.    . Cholecalciferol (VITAMIN D-3 PO) Take 1 tablet by mouth 2 (two) times a week.     . esomeprazole (NEXIUM) 40 MG capsule Take 40 mg by mouth daily before breakfast.    . folic acid (FOLVITE) 1 MG tablet TAKE 1 TABLET (1 MG TOTAL) BY MOUTH DAILY. 30 tablet 5  . LORazepam (ATIVAN) 0.5 MG tablet Take 0.5 mg by mouth at bedtime as needed. for sleep  5  . Multiple Vitamins-Minerals (PRESERVISION AREDS PO) Take 1  tablet by mouth daily.     . potassium chloride SA (K-DUR,KLOR-CON) 20 MEQ tablet Take 1 tablet (20 mEq total) by mouth daily. 7 tablet 0  . simvastatin (ZOCOR) 20 MG tablet Take 20 mg by mouth every evening.     No current facility-administered medications for this visit.    SURGICAL HISTORY:  Past Surgical History  Procedure Laterality Date  . Cardiovascular stress test  06/29/2009    EF 79%, NO ISCHEMIA  . Tracheostomy      at age 52 or 5 d/t polyps on vocal cord  . Abdominal hysterectomy      at age 42;partial   . Colonoscopy    . Video bronchoscopy N/A 07/08/2013    Procedure: VIDEO BRONCHOSCOPY;  Surgeon: [name], MD;  Location: [facility] OR;  Service: Thoracic;  Laterality: N/A;  . Video assisted thoracoscopy (vats)/wedge resection Right 07/08/2013    Procedure: VIDEO ASSISTED THORACOSCOPY (VATS)/WEDGE RESECTION;  Surgeon: [name], MD;  Location: [facility];  Service: Thoracic;  Laterality: Right;    REVIEW OF SYSTEMS:  A comprehensive review of systems was negative.   PHYSICAL EXAMINATION: General appearance: alert, cooperative and no distress Head: Normocephalic, without obvious abnormality, atraumatic Neck: no adenopathy, no JVD, supple, symmetrical, trachea midline and thyroid not enlarged, symmetric, no tenderness/mass/nodules Lymph nodes: Cervical, supraclavicular, and axillary nodes normal. Resp: clear to auscultation bilaterally Back: symmetric, no curvature. ROM normal. No CVA tenderness. Cardio: regular rate and rhythm, S1, S2 normal, no murmur, click, rub or gallop GI: soft, non-tender; bowel sounds normal; no masses,  no organomegaly Extremities: extremities normal, atraumatic, no cyanosis or edema  ECOG PERFORMANCE STATUS: 1 - Symptomatic but completely ambulatory  Blood pressure 160/68, pulse 83, temperature 98.3 F (36.8 C), temperature source Oral, resp. rate 18, height '5\' 4"'$  (1.626 m), weight 142 lb 4.8 oz (64.547 kg), SpO2 100 %.  LABORATORY DATA: Lab Results  Component Value Date   WBC 7.2 03/05/2015   HGB 13.5 03/05/2015   HCT 38.7 03/05/2015   MCV 97.1 03/05/2015   PLT 237 03/05/2015      Chemistry      Component Value Date/Time   NA 138 03/05/2015 0756   NA 134* 07/14/2013 0452   K 3.4* 03/05/2015 0756   K 3.5 07/14/2013 0452   CL 93* 07/14/2013 0452   CO2 30* 03/05/2015 0756   CO2 32 07/14/2013 0452   BUN 29.3* 03/05/2015 0756   BUN 18 07/14/2013 0452   CREATININE 1.4* 03/05/2015 0756   CREATININE 0.87 07/14/2013 0452      Component Value Date/Time   CALCIUM 10.2 03/05/2015 0756   CALCIUM 9.4 07/14/2013 0452   ALKPHOS 76 03/05/2015 0756    ALKPHOS 89 07/10/2013 0400   AST 29 03/05/2015 0756   AST 31 07/10/2013 0400   ALT 19 03/05/2015 0756   ALT 21 07/10/2013 0400   BILITOT 0.63 03/05/2015 0756   BILITOT 0.3 07/10/2013 0400       RADIOGRAPHIC STUDIES: Ct Chest W Contrast  03/05/2015   CLINICAL DATA:  Right upper lobe lung cancer diagnosed December 2014. Chemotherapy completed March 2015. Right upper lobectomy. Restaging.  EXAM: CT CHEST WITH CONTRAST  TECHNIQUE: Multidetector CT imaging of the chest was performed during intravenous contrast administration.  CONTRAST:  47m OMNIPAQUE IOHEXOL 300 MG/ML  SOLN  COMPARISON:  08/28/2014  FINDINGS: Mediastinum/Nodes: Aortic and branch vessel atherosclerotic calcification noted. No pathologic adenopathy.  Lungs/Pleura: Right upper lobectomy. Mild findings of centrilobular emphysema.  Sub  solid 4 by 5 mm nodule in the left lower lobe, image 41 series 5, no change from 06/19/2013. 4 mm sub solid nodule in the left lower lobe on image 39 series 5, no change from 06/19/2013. 3 mm ground-glass nodular density in the left upper lobe, no change from 06/19/2013.  Upper abdomen: Unremarkable  Musculoskeletal: Levoconvex lumbar scoliosis is partially imaged, with rotary component.  IMPRESSION: 1. No recurrent malignancy identified. 2. Several tiny sub solid left pulmonary nodules are unchanged the earliest comparable reference exam from 06/19/2013, and highly likely to be benign. These may merit attention on followup studies. 3. Atherosclerosis. 4. Levoconvex lumbar scoliosis.   Electronically Signed   By: Van Clines M.D.   On: 03/05/2015 09:31   ASSESSMENT AND PLAN: This is a very pleasant 69 years old white female with synchronous non-small cell lung cancer presenting with stage IA as well as a stage IB status post right upper lobectomy with lymph node dissection and currently undergoing adjuvant chemotherapy with cisplatin and Alimta status post 4 cycles completed in March 2015. The patient is  tolerating her treatment fairly well with no significant adverse effects. Restaging CT scan of the chest showed no evidence for disease recurrence. I discussed the scan results with the patient and her husband today.  I recommended for her to continue on observation with repeat CT scan of the chest in 6 months for reevaluation of her disease. She was advised to call immediately she has any concerning symptoms in the interval. The patient voices understanding of current disease status and treatment options and is in agreement with the current care plan.  All questions were answered. The patient knows to call the clinic with any problems, questions or concerns. We can certainly see the patient much sooner if necessary.  Disclaimer: This note was dictated with voice recognition software. Similar sounding words can inadvertently be transcribed and may not be corrected upon review.

## 2015-03-12 NOTE — Progress Notes (Signed)
[address]       [name] [address]             [phone]                     [name] [name] [facility] Medical Record #[id number] Date of Birth: Jun 06, 1946  Curt Bears, MD [name], MD  Chief Complaint:   PostOp Follow Up Visit 07/08/2013  OPERATIVE REPORT  PREOPERATIVE DIAGNOSIS: Right upper lobe lung mass.  POSTOPERATIVE DIAGNOSIS: Carcinoma of the right upper lobe.  PROCEDURE PERFORMED: Bronchoscopy, right video-assisted thoracoscopy,  wedge resection, right upper lobe, with completion of right upper  lobectomy, lymph node dissection, and placement of On-Q device.  SURGEON: Meghan Bal, MD    Adjuvant chemotherapy with systemic chemotherapy in the form of cisplatin at 75 mg per meter squared and Alimta 500 mg per meter squared given every 3 weeks for a total of 4 cycles, status post 4 cycles. Last cycle was given 10/23/2013.   PATH: ALK EGFR negative , [facility]  mRNA testing completed see scanned document Diagnosis 1. 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 - PERINEURAL INVASION IS IDENTIFIED. - THE SURGICAL RESECTION MARGINS ARE NEGATIVE FOR ADENOCARCINOMA. - SEE ONCOLOGY TABLE BELOW. 2. 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 - ATYPICAL ADENOMATOUS HYPERPLASIA. - THERE IS NO EVIDENCE OF CARCINOMA IN 2 OF 2 LYMPH NODES (0/2). - THE SURGICAL RESECTION MARGINS ARE NEGATIVE FOR CARCINOMA. - SEE ONCOLOGY TABLE BELOW. 3. Lymph node, biopsy, 10 R - THERE IS NO EVIDENCE OF CARCINOMA IN 1 OF 1 LYMPH NODE (0/1). 1 of 4 FINAL for [name], [name] ANN [phone]) Diagnosis(continued) 4. Lymph node, biopsy, 11 R - THERE IS NO EVIDENCE OF CARCINOMA IN 1 OF 1 LYMPH NODE (0/1). 5. Lymph node, biopsy, 12 R - THERE IS NO EVIDENCE OF CARCINOMA IN 1 OF 1 LYMPH NODE  (0/1) 6. Lymph node, biopsy, 13 R - THERE IS NO EVIDENCE OF CARCINOMA IN 1 OF 1 LYMPH NODE (0/1). 7. Lymph node, biopsy, 4 R - THERE IS NO EVIDENCE OF CARCINOMA IN 1 OF 1 LYMPH NODE (0/1). Microscopic Comment 1. LUNG (Part 1) Specimen, including laterality: Right upper lobe Procedure: Wedge resection followed by completion lobectomy Specimen integrity (intact/disrupted): Intact Tumor site: Right upper lobe Tumor focality: Adenocarcinoma is unifocal Maximum tumor size (cm): 4.3 cm (gross measurement) Histologic type: Adenocarcinoma. Grade: Well differentiated (low grade Margins: Negative for adenocarcinoma Distance to closest margin (cm): 0.8 cm to the nearest stapled margin (gross measurement) Visceral pleura invasion: Not identified Tumor extension: Confined to lung parenchyma Treatment effect (if treated with neoadjuvant therapy): N/A Lymph -Vascular invasion: Not identified Lymph nodes: Number examined - 7; Number N1 nodes positive 0; Number N2 nodes positive 0 TNM code: pT2a, pN0 Ancillary studies: A block will be sent for EGFR and ALK studies and the results reported separately. Comments: The vast majority of the adenocarcinoma has a lepidic growth pattern (so called in-situ adenocarcinoma). However, there are a few small foci of stromal invasion identified. 2. LUNG (Part 2) Specimen, including laterality: Right upper lobe Procedure: Completion lobectomy Specimen integrity (intact/disrupted): Intact Tumor site: Perihilar Tumor focality: Squamous cell carcinoma is unifocal Maximum tumor size (cm): 1.6 cm (gross measurement) Histologic type: Squamous cell carcinoma Grade: Well differentiated (low grade) Margins: Negative for carcinoma Distance to closest margin (  cm): 1.5 cm to the bronchial resection margin (gross measurement) Visceral pleura invasion: Not identified. Tumor extension: Confined to lung parenchyma Treatment effect (if treated with neoadjuvant therapy):  N/A Lymph -Vascular invasion: Not identified Lymph nodes: Number examined - 7; Number N1 nodes positive 0; Number N2 nodes positive 0 TNM code: pT1a, pN0 Ancillary studies: N/A Comments: The squamous cell carcinoma is predominantly in-situ. However, there is a single focus suspicious for early invasion.   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 She is making good progress postoperatively. Increasing her physical activity appropriately level.  She denies any significant shortness of breath. Has had no fever chills. Her incisions are healing well.    Zubrod Score: At the time of surgery this patient's most appropriate activity status/level should be described as: [x]    0    Normal activity, no symptoms []    1    Restricted in physical strenuous activity but ambulatory, able to do out light work []    2    Ambulatory and capable of self care, unable to do work activities, up and about >50 % of waking hours                              []    3    Only limited self care, in bed greater than 50% of waking hours []    4    Completely disabled, no self care, confined to bed or chair []    5    Moribund   History  Smoking status  . Former Smoker  . Types: Cigarettes  . Quit date: 08/08/1978  Smokeless tobacco  . Never Used    Comment: quit 35 yrs ago       No Known Allergies  Current Outpatient Prescriptions  Medication Sig Dispense Refill  . amLODipine (NORVASC) 5 MG tablet Take 5 mg by mouth daily.      . chlorthalidone (HYGROTON) 25 MG tablet Take 25 mg by mouth daily.      . Cholecalciferol (VITAMIN D-3 PO) Take 1 tablet by mouth 2 (two) times a week.       . esomeprazole (NEXIUM) 40 MG capsule Take 40 mg by mouth daily before breakfast.      . folic acid (FOLVITE) 1 MG tablet TAKE 1  TABLET (1 MG TOTAL) BY MOUTH DAILY.  30 tablet  3  . Multiple Vitamins-Minerals (PRESERVISION AREDS PO) Take 1 tablet by mouth daily.       Marland Kitchen oxyCODONE-acetaminophen (PERCOCET/ROXICET) 5-325 MG per tablet Take 1-2 tablets by mouth every 8 (eight) hours as needed for severe pain.  30 tablet  0  . simvastatin (ZOCOR) 20 MG tablet Take 20 mg by mouth every evening.           Physical Exam: BP 170/76 mmHg  Pulse 81  Resp 16  Ht 5' 4" (1.626 m)  Wt 142 lb (64.411 kg)  BMI 24.36 kg/m2  SpO2 98%  General appearance: alert and cooperative Neurologic: intact Heart: regular rate and rhythm, S1, S2 normal, no murmur, click, rub or gallop Lungs: clear to auscultation bilaterally Abdomen: soft, non-tender; bowel sounds normal; no masses,  no organomegaly Extremities: extremities normal, atraumatic, no cyanosis  or edema and Homans sign is negative, no sign of DVT Wound: Right chest port sites/incisions healing well, sutures removed Patient has no cervical or supraclavicular adenopathy  Diagnostic Studies & Laboratory data:         Recent Radiology Findings: Ct Chest W Contrast  03/05/2015   CLINICAL DATA:  Right upper lobe lung cancer diagnosed December 2014. Chemotherapy completed March 2015. Right upper lobectomy. Restaging.  EXAM: CT CHEST WITH CONTRAST  TECHNIQUE: Multidetector CT imaging of the chest was performed during intravenous contrast administration.  CONTRAST:  41m OMNIPAQUE IOHEXOL 300 MG/ML  SOLN  COMPARISON:  08/28/2014  FINDINGS: Mediastinum/Nodes: Aortic and branch vessel atherosclerotic calcification noted. No pathologic adenopathy.  Lungs/Pleura: Right upper lobectomy. Mild findings of centrilobular emphysema.  Sub solid 4 by 5 mm nodule in the left lower lobe, image 41 series 5, no change from 06/19/2013. 4 mm sub solid nodule in the left lower lobe on image 39 series 5, no change from 06/19/2013. 3 mm ground-glass nodular density in the left upper lobe, no change from  06/19/2013.  Upper abdomen: Unremarkable  Musculoskeletal: Levoconvex lumbar scoliosis is partially imaged, with rotary component.  IMPRESSION: 1. No recurrent malignancy identified. 2. Several tiny sub solid left pulmonary nodules are unchanged the earliest comparable reference exam from 06/19/2013, and highly likely to be benign. These may merit attention on followup studies. 3. Atherosclerosis. 4. Levoconvex lumbar scoliosis.   Electronically Signed   By: WVan ClinesM.D.   On: 03/05/2015 09:31   EXAM:  02/2014  CT CHEST WITH CONTRAST  TECHNIQUE:  Multidetector CT imaging of the chest was performed during  intravenous contrast administration.  CONTRAST: 871mOMNIPAQUE IOHEXOL 300 MG/ML SOLN  COMPARISON: 11/20/2013 and 06/19/2013.  FINDINGS:  No pathologically enlarged mediastinal, hilar or axillary lymph  nodes. Heart size normal. No pericardial effusion. Esophageal  dilatation can be seen with dysmotility.  Postoperative changes of right upper lobectomy with scarring and  volume loss in the right hemi thorax. Mild centrilobular emphysema.  Left lower lobe nodules measure up to 4 mm (series 5, image 38),  stable from 06/19/2013. No pleural fluid. Airway is otherwise  unremarkable.  Incidental imaging of the upper abdomen shows irregularity of the  liver margin. Visualized portions of the liver and adrenal glands  are otherwise unremarkable. Renal cortical scarring bilaterally.  Visualized portions of the spleen, pancreas, stomach and bowel are  grossly unremarkable. No upper abdominal adenopathy. No worrisome  lytic or sclerotic lesions. Degenerative changes are seen in the  spine. Thoracotomy changes on the right. Mild compression of a lower  thoracic vertebral body, unchanged.  IMPRESSION:  1. Postoperative changes of right upper lobectomy without evidence  of metastatic disease.  2. Left lower lobe nodules are stable from 06/19/2013.  3. Marginal irregularity of the liver  is indicative of cirrhosis.  Electronically Signed  By: MeLorin Picket.D.  On: 02/24/2014 12:19  Ct Chest W Contrast  11/20/2013   CLINICAL DATA:  Restaging of non-small-cell lung cancer. Diagnosed 2014. Chemotherapy ongoing. Right lung surgery. Hysterectomy.  EXAM: CT CHEST WITH CONTRAST  TECHNIQUE: Multidetector CT imaging of the chest was performed during intravenous contrast administration.  CONTRAST:  8053mMNIPAQUE IOHEXOL 300 MG/ML  SOLN  COMPARISON:  Plain film 08/29/2013. PET 07/01/2013. Chest CT 06/19/2013.  FINDINGS: Lungs/Pleura: Status post right upper lobectomy. Mild centrilobular emphysema. High right upper lobe ground-glass nodule measures 6 mm on image 13. This may represent the 3 mm ground-glass nodule on image 23 of the  prior exam. Possibly mildly enlarged.  4 mm left lower lobe lung nodule on image 38 which is similar to on the prior exam. Immediately cephalad vague 2 mm nodule on image 35 is also felt to be similar.  5 mm ground-glass opacity in the superior segment left lower lobe on image 26 is unchanged.  A left upper lobe 2 mm nodule on image 18 is not readily apparent on the prior.  No pleural fluid.  Heart/Mediastinum: No supraclavicular adenopathy. Aortic and branch vessel atherosclerosis. Normal heart size, without pericardial effusion. No central pulmonary embolism, on this non-dedicated study. No mediastinal or hilar adenopathy.  Upper Abdomen: Mild cirrhosis. Scarred kidneys bilaterally. Normal adrenal glands. Atherosclerosis at the origin of bilateral renal arteries.  Bones/Musculoskeletal: Surgical defects of anterior right ribs. Sclerosis in the anterolateral right fourth rib is presumably due to postsurgical healing. Mild superior inferior endplate irregularity at T9 is unchanged.  IMPRESSION: 1. Surgical changes of right upper lobectomy, without locally recurrent or metastatic disease. 2. Centrilobular emphysema with scattered smaller pulmonary nodules. The majority of  these are similar. Right lower lobe and left apical nodules may be new/ enlarged as detailed above. These warrant followup attention. 3. Mild cirrhosis. 4. Similar mild T9 compression deformity.   Electronically Signed   By: Abigail Miyamoto M.D.   On: 11/20/2013 09:36   Recent Labs: Lab Results  Component Value Date   WBC 7.2 03/05/2015   HGB 13.5 03/05/2015   HCT 38.7 03/05/2015   PLT 237 03/05/2015   GLUCOSE 102 03/05/2015   ALT 19 03/05/2015   AST 29 03/05/2015   NA 138 03/05/2015   K 3.4* 03/05/2015   CL 93* 07/14/2013   CREATININE 1.4* 03/05/2015   BUN 29.3* 03/05/2015   CO2 30* 03/05/2015   INR 0.89 07/03/2013   Chronic Kidney Disease   Stage I     GFR >90  Stage II    GFR 60-89  Stage IIIA GFR 45-59  Stage IIIB GFR 30-44  Stage IV   GFR 15-29  Stage V    GFR  <15  Lab Results  Component Value Date   CREATININE 1.4* 03/05/2015   Estimated Creatinine Clearance: 32.7 mL/min (by C-G formula based on Cr of 1.4).   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

## 2015-09-09 ENCOUNTER — Encounter (HOSPITAL_COMMUNITY): Payer: Self-pay

## 2015-09-09 ENCOUNTER — Ambulatory Visit (HOSPITAL_COMMUNITY)
Admission: RE | Admit: 2015-09-09 | Discharge: 2015-09-09 | Disposition: A | Discharge status: Home / Self Care | Payer: Medicare Other | Source: Ambulatory Visit | Attending: Internal Medicine | Admitting: Internal Medicine

## 2015-09-09 ENCOUNTER — Other Ambulatory Visit (HOSPITAL_BASED_OUTPATIENT_CLINIC_OR_DEPARTMENT_OTHER): Payer: Medicare Other

## 2015-09-09 DIAGNOSIS — C3411 Malignant neoplasm of upper lobe, right bronchus or lung: Secondary | ICD-10-CM | POA: Insufficient documentation

## 2015-09-09 DIAGNOSIS — Z9889 Other specified postprocedural states: Secondary | ICD-10-CM | POA: Diagnosis not present

## 2015-09-09 LAB — CBC WITH DIFFERENTIAL/PLATELET
BASO%: 0.4 % (ref 0.0–2.0)
Basophils Absolute: 0 10*3/uL (ref 0.0–0.1)
EOS%: 3.3 % (ref 0.0–7.0)
Eosinophils Absolute: 0.2 10*3/uL (ref 0.0–0.5)
HCT: 38.2 % (ref 34.8–46.6)
HGB: 13.5 g/dL (ref 11.6–15.9)
LYMPH%: 35.8 % (ref 14.0–49.7)
MCH: 33.7 pg (ref 25.1–34.0)
MCHC: 35.3 g/dL (ref 31.5–36.0)
MCV: 95.3 fL (ref 79.5–101.0)
MONO#: 0.5 10*3/uL (ref 0.1–0.9)
MONO%: 7.7 % (ref 0.0–14.0)
NEUT#: 3.7 10*3/uL (ref 1.5–6.5)
NEUT%: 52.8 % (ref 38.4–76.8)
Platelets: 219 10*3/uL (ref 145–400)
RBC: 4.01 10*6/uL (ref 3.70–5.45)
RDW: 12.6 % (ref 11.2–14.5)
WBC: 6.9 10*3/uL (ref 3.9–10.3)
lymph#: 2.5 10*3/uL (ref 0.9–3.3)

## 2015-09-09 LAB — COMPREHENSIVE METABOLIC PANEL
ALT: 21 U/L (ref 0–55)
AST: 25 U/L (ref 5–34)
Albumin: 4.2 g/dL (ref 3.5–5.0)
Alkaline Phosphatase: 90 U/L (ref 40–150)
Anion Gap: 14 mEq/L — ABNORMAL HIGH (ref 3–11)
BUN: 19.6 mg/dL (ref 7.0–26.0)
CHLORIDE: 94 meq/L — AB (ref 98–109)
CO2: 28 meq/L (ref 22–29)
CREATININE: 1.2 mg/dL — AB (ref 0.6–1.1)
Calcium: 9.8 mg/dL (ref 8.4–10.4)
EGFR: 48 mL/min/{1.73_m2} — AB (ref >90)
Glucose: 95 mg/dl (ref 70–140)
Potassium: 3.5 mEq/L (ref 3.5–5.1)
Sodium: 136 mEq/L (ref 136–145)
Total Bilirubin: 0.61 mg/dL (ref 0.20–1.20)
Total Protein: 7.2 g/dL (ref 6.4–8.3)

## 2015-09-09 MED ORDER — IOHEXOL 300 MG/ML  SOLN
75.0000 mL | Freq: Once | INTRAMUSCULAR | Status: AC | PRN
  Administered 2015-09-09: 75 mL via INTRAVENOUS

## 2015-09-10 ENCOUNTER — Encounter: Payer: Medicare Other | Admitting: Cardiothoracic Surgery

## 2015-09-16 ENCOUNTER — Encounter: Payer: Self-pay | Admitting: Internal Medicine

## 2015-09-16 ENCOUNTER — Ambulatory Visit (HOSPITAL_BASED_OUTPATIENT_CLINIC_OR_DEPARTMENT_OTHER): Payer: Medicare Other | Admitting: Internal Medicine

## 2015-09-16 ENCOUNTER — Telehealth: Payer: Self-pay | Admitting: Internal Medicine

## 2015-09-16 VITALS — BP 166/79 | HR 77 | Temp 98.2°F | Resp 18 | Ht 64.0 in | Wt 145.7 lb

## 2015-09-16 DIAGNOSIS — C3411 Malignant neoplasm of upper lobe, right bronchus or lung: Secondary | ICD-10-CM

## 2015-09-16 NOTE — Telephone Encounter (Signed)
Pt confirmed labs/ov per 02/08 POF, gave pt AVS and Calendar... KJ

## 2015-09-16 NOTE — Progress Notes (Signed)
[facility] [phone]   [phone]  OFFICE PROGRESS NOTE  [name], [place], [place] [address]  DIAGNOSIS: Cancer of upper lobe of lung  Primary site: Lung (Right)  Staging method: AJCC 7th Edition  Clinical: (T1a, N0)  Pathologic: Stage IA (T1a, N0, cM0) signed by [name], MD on 07/09/2013 1:58 PM  Summary: Stage IA (T1a, N0, cM0)  Lung cancer, Right upper lobe  Primary site: Lung (Right)  Pathologic: Stage IB (T2a, N0, cM0) signed by [name], MD on 07/09/2013 1:54 PM  Summary: Stage IB (T2a, N0, cM0)   PRIOR THERAPY:  1) Status post right video-assisted thoracoscopy, wedge resection, right upper lobe with completion of right upper lobectomy with lymph node dissection under the care of Dr. Servando Snare on 07/08/2013. 2) Adjuvant chemotherapy with systemic chemotherapy in the form of cisplatin at 75 mg per meter squared and Alimta 500 mg per meter squared given every 3 weeks for a total of 4 cycles, status post 4 cycles. Last cycle was given 10/23/2013.  CURRENT THERAPY: Observation.  CHEMOTHERAPY INTENT: Curative  CURRENT # OF CHEMOTHERAPY CYCLES: 0 CURRENT ANTIEMETICS: Aloxi, dexamethasone, Compazine  CURRENT SMOKING STATUS: Former smoker, quit 08/08/1978  ORAL CHEMOTHERAPY AND CONSENT: n/a  CURRENT BISPHOSPHONATES USE: none  PAIN MANAGEMENT: Percocent  NARCOTICS INDUCED CONSTIPATION: none  LIVING WILL AND CODE STATUS: ?    INTERVAL HISTORY: [name] 70 y.o. female returns to the clinic today for 6 months followup visit accompanied by her husband. The patient is feeling fine today with no specific complaints. She continues to have elevated blood pressure when she comes to the [facility] but much better at home. The patient denied having any nausea or vomiting. She has no fever or chills. She has no significant chest pain, shortness of breath, cough or hemoptysis. No significant  weight loss or night sweats. She has repeat CT scan of the chest performed recently and she is here for evaluation and discussion of her scan results.  MEDICAL HISTORY: Past Medical History  Diagnosis Date  . Hyperlipidemia     takes Zocor daily  . Cavitating mass in right upper lung lobe   . Lung nodules     right upper lobe  . GERD (gastroesophageal reflux disease)     takes Nexium daily  . Hypertension     takes Amlodipine daily and Chlorthalidone as well  . Dizziness     occasionally   . Arthritis     back   . History of colon polyps   . Diverticulosis   . Family history of kidney infection     pt history-saw Dr.Wrenn  . Nocturia   . Lung cancer, Right upper lobe   . CKD (chronic kidney disease) stage 3, GFR 30-59 ml/min 03/12/2015    ALLERGIES:  has No Known Allergies.  MEDICATIONS:  Current Outpatient Prescriptions  Medication Sig Dispense Refill  . alendronate (FOSAMAX) 70 MG tablet     . amLODipine (NORVASC) 5 MG tablet Take 5 mg by mouth daily.    . chlorthalidone (HYGROTON) 25 MG tablet Take 25 mg by mouth daily.    . Cholecalciferol (VITAMIN D-3 PO) Take 1 tablet by mouth 2 (two) times a week.     . ciprofloxacin (CIPRO) 250 MG tablet Take 250 mg by mouth 2 (two) times daily.  0  . esomeprazole (NEXIUM) 40 MG capsule Take 40 mg by mouth daily before breakfast.    .  folic acid (FOLVITE) 1 MG tablet TAKE 1 TABLET (1 MG TOTAL) BY MOUTH DAILY. 30 tablet 5  . KLOR-CON 10 10 MEQ tablet Take 10 mEq by mouth daily.  4  . LORazepam (ATIVAN) 0.5 MG tablet Take 0.5 mg by mouth at bedtime as needed. for sleep  5  . Multiple Vitamins-Minerals (PRESERVISION AREDS PO) Take 1 tablet by mouth daily.     . predniSONE (DELTASONE) 10 MG tablet Take 10 mg by mouth 2 (two) times daily.  0  . simvastatin (ZOCOR) 20 MG tablet Take 20 mg by mouth every evening.     No current facility-administered medications for this visit.    SURGICAL HISTORY:  Past Surgical History  Procedure  Laterality Date  . Cardiovascular stress test  06/29/2009    EF 79%, NO ISCHEMIA  . Tracheostomy      at age 60 or 5 d/t polyps on vocal cord  . Abdominal hysterectomy      at age 17;partial   . Colonoscopy    . Video bronchoscopy N/A 07/08/2013    Procedure: VIDEO BRONCHOSCOPY;  Surgeon: [name], MD;  Location: [facility] OR;  Service: Thoracic;  Laterality: N/A;  . Video assisted thoracoscopy (vats)/wedge resection Right 07/08/2013    Procedure: VIDEO ASSISTED THORACOSCOPY (VATS)/WEDGE RESECTION;  Surgeon: [name], MD;  Location: [facility];  Service: Thoracic;  Laterality: Right;    REVIEW OF SYSTEMS:  A comprehensive review of systems was negative.   PHYSICAL EXAMINATION: General appearance: alert, cooperative and no distress Head: Normocephalic, without obvious abnormality, atraumatic Neck: no adenopathy, no JVD, supple, symmetrical, trachea midline and thyroid not enlarged, symmetric, no tenderness/mass/nodules Lymph nodes: Cervical, supraclavicular, and axillary nodes normal. Resp: clear to auscultation bilaterally Back: symmetric, no curvature. ROM normal. No CVA tenderness. Cardio: regular rate and rhythm, S1, S2 normal, no murmur, click, rub or gallop GI: soft, non-tender; bowel sounds normal; no masses,  no organomegaly Extremities: extremities normal, atraumatic, no cyanosis or edema  ECOG PERFORMANCE STATUS: 1 - Symptomatic but completely ambulatory  Blood pressure 166/79, pulse 77, temperature 98.2 F (36.8 C), temperature source Oral, resp. rate 18, height '5\' 4"'$  (1.626 m), weight 145 lb 11.2 oz (66.089 kg), SpO2 100 %.  LABORATORY DATA: Lab Results  Component Value Date   WBC 6.9 09/09/2015   HGB 13.5 09/09/2015   HCT 38.2 09/09/2015   MCV 95.3 09/09/2015   PLT 219 09/09/2015      Chemistry      Component Value Date/Time   NA 136 09/09/2015 0837   NA 134* 07/14/2013 0452   K 3.5 09/09/2015 0837   K 3.5 07/14/2013 0452   CL 93* 07/14/2013 0452   CO2  28 09/09/2015 0837   CO2 32 07/14/2013 0452   BUN 19.6 09/09/2015 0837   BUN 18 07/14/2013 0452   CREATININE 1.2* 09/09/2015 0837   CREATININE 0.87 07/14/2013 0452      Component Value Date/Time   CALCIUM 9.8 09/09/2015 0837   CALCIUM 9.4 07/14/2013 0452   ALKPHOS 90 09/09/2015 0837   ALKPHOS 89 07/10/2013 0400   AST 25 09/09/2015 0837   AST 31 07/10/2013 0400   ALT 21 09/09/2015 0837   ALT 21 07/10/2013 0400   BILITOT 0.61 09/09/2015 0837   BILITOT 0.3 07/10/2013 0400       RADIOGRAPHIC STUDIES: Ct Chest W Contrast  09/09/2015  CLINICAL DATA:  Restaging right lung cancer. Initial diagnosis 2014. Status post right upper lobe lobectomy. EXAM: CT CHEST WITH  CONTRAST TECHNIQUE: Multidetector CT imaging of the chest was performed during intravenous contrast administration. CONTRAST:  71m OMNIPAQUE IOHEXOL 300 MG/ML  SOLN COMPARISON:  Multiple prior chest CTs. The most recent is 03/05/2015. FINDINGS: Mediastinum/Nodes: No chest wall mass, breast mass, supraclavicular or axillary lymphadenopathy. The thyroid gland is grossly normal. The heart is normal in size. No pericardial effusion. The aorta is normal in caliber. No dissection. The pulmonary arteries are normal. No mediastinal or hilar mass or adenopathy. The esophagus is grossly normal. Lungs/Pleura: Stable emphysematous changes. Stable surgical changes from a right upper lobe lobectomy. No findings for recurrent tumor. Stable tiny sub 3 mm subsolid nodules are again demonstrated. These are considered benign given their stability since 2015. No new pulmonary lesions. No acute pulmonary findings. No pleural effusion. Upper abdomen: No significant upper abdominal findings. Small hiatal hernia noted. The adrenal glands are normal in stable. Musculoskeletal: Stable scoliosis and degenerative changes involving the spine. No findings for metastatic bone disease. IMPRESSION: 1. Stable surgical changes from a right upper lobe lobectomy. No findings  for recurrent tumor or metastatic disease. 2. A few small scattered stable sub 3 mm sub solid nodules are unchanged since 2015 and considered benign. 3. Stable emphysematous changes.  No acute pulmonary findings. Electronically Signed   By: PMarijo SanesM.D.   On: 09/09/2015 10:36   ASSESSMENT AND PLAN: This is a very pleasant 70years old white female with synchronous non-small cell lung cancer presenting with stage IA as well as a stage IB status post right upper lobectomy with lymph node dissection and currently undergoing adjuvant chemotherapy with cisplatin and Alimta status post 4 cycles completed in March 2015. The patient has no complaints today. Her CT scan of the chest performed on 09/09/2015 showed no evidence for disease recurrence. I discussed the scan results with the patient and her husband today.  I recommended for her to continue on observation with repeat CT scan of the chest in 6 months for reevaluation of her disease. She was advised to call immediately she has any concerning symptoms in the interval. The patient voices understanding of current disease status and treatment options and is in agreement with the current care plan.  All questions were answered. The patient knows to call the clinic with any problems, questions or concerns. We can certainly see the patient much sooner if necessary.  Disclaimer: This note was dictated with voice recognition software. Similar sounding words can inadvertently be transcribed and may not be corrected upon review.

## 2015-09-17 ENCOUNTER — Ambulatory Visit (INDEPENDENT_AMBULATORY_CARE_PROVIDER_SITE_OTHER): Payer: Medicare Other | Admitting: Cardiothoracic Surgery

## 2015-09-17 ENCOUNTER — Encounter: Payer: Self-pay | Admitting: Cardiothoracic Surgery

## 2015-09-17 VITALS — BP 140/75 | HR 90 | Resp 20 | Ht 64.0 in | Wt 145.0 lb

## 2015-09-17 DIAGNOSIS — R918 Other nonspecific abnormal finding of lung field: Secondary | ICD-10-CM

## 2015-09-17 DIAGNOSIS — Z902 Acquired absence of lung [part of]: Secondary | ICD-10-CM | POA: Diagnosis not present

## 2015-09-17 DIAGNOSIS — C3411 Malignant neoplasm of upper lobe, right bronchus or lung: Secondary | ICD-10-CM

## 2015-09-17 NOTE — Progress Notes (Signed)
[address]       [place] [address]             [phone]                     [name] [facility] Medical Record #[id number] Date of Birth: Jun 06, 1946  Meghan Bears, MD [name], MD  Chief Complaint:   PostOp Follow Up Visit 07/08/2013  OPERATIVE REPORT  PREOPERATIVE DIAGNOSIS: Right upper lobe lung mass.  POSTOPERATIVE DIAGNOSIS: Carcinoma of the right upper lobe.  PROCEDURE PERFORMED: Bronchoscopy, right video-assisted thoracoscopy,  wedge resection, right upper lobe, with completion of right upper  lobectomy, lymph node dissection, and placement of On-Q device.  SURGEON: Meghan Bal, MD    Adjuvant chemotherapy with systemic chemotherapy in the form of cisplatin at 75 mg per meter squared and Alimta 500 mg per meter squared given every 3 weeks for a total of 4 cycles, status post 4 cycles. Last cycle was given 10/23/2013.   PATH: ALK EGFR negative , [facility]  mRNA testing completed see scanned document Diagnosis 1. 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 - PERINEURAL INVASION IS IDENTIFIED. - THE SURGICAL RESECTION MARGINS ARE NEGATIVE FOR ADENOCARCINOMA. - SEE ONCOLOGY TABLE BELOW. 2. 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 - ATYPICAL ADENOMATOUS HYPERPLASIA. - THERE IS NO EVIDENCE OF CARCINOMA IN 2 OF 2 LYMPH NODES (0/2). - THE SURGICAL RESECTION MARGINS ARE NEGATIVE FOR CARCINOMA. - SEE ONCOLOGY TABLE BELOW. 3. Lymph node, biopsy, 10 R - THERE IS NO EVIDENCE OF CARCINOMA IN 1 OF 1 LYMPH NODE (0/1). 1 of 4 FINAL for [name] ANN [phone]) Diagnosis(continued) 4. Lymph node, biopsy, 11 R - THERE IS NO EVIDENCE OF CARCINOMA IN 1 OF 1 LYMPH NODE (0/1). 5. Lymph node, biopsy, 12 R - THERE IS NO EVIDENCE OF CARCINOMA IN 1 OF 1 LYMPH NODE  (0/1) 6. Lymph node, biopsy, 13 R - THERE IS NO EVIDENCE OF CARCINOMA IN 1 OF 1 LYMPH NODE (0/1). 7. Lymph node, biopsy, 4 R - THERE IS NO EVIDENCE OF CARCINOMA IN 1 OF 1 LYMPH NODE (0/1). Microscopic Comment 1. LUNG (Part 1) Specimen, including laterality: Right upper lobe Procedure: Wedge resection followed by completion lobectomy Specimen integrity (intact/disrupted): Intact Tumor site: Right upper lobe Tumor focality: Adenocarcinoma is unifocal Maximum tumor size (cm): 4.3 cm (gross measurement) Histologic type: Adenocarcinoma. Grade: Well differentiated (low grade Margins: Negative for adenocarcinoma Distance to closest margin (cm): 0.8 cm to the nearest stapled margin (gross measurement) Visceral pleura invasion: Not identified Tumor extension: Confined to lung parenchyma Treatment effect (if treated with neoadjuvant therapy): N/A Lymph -Vascular invasion: Not identified Lymph nodes: Number examined - 7; Number N1 nodes positive 0; Number N2 nodes positive 0 TNM code: pT2a, pN0 Ancillary studies: A block will be sent for EGFR and ALK studies and the results reported separately. Comments: The vast majority of the adenocarcinoma has a lepidic growth pattern (so called in-situ adenocarcinoma). However, there are a few small foci of stromal invasion identified. 2. LUNG (Part 2) Specimen, including laterality: Right upper lobe Procedure: Completion lobectomy Specimen integrity (intact/disrupted): Intact Tumor site: Perihilar Tumor focality: Squamous cell carcinoma is unifocal Maximum tumor size (cm): 1.6 cm (gross measurement) Histologic type: Squamous cell carcinoma Grade: Well differentiated (low grade) Margins: Negative for carcinoma Distance to closest margin (  cm): 1.5 cm to the bronchial resection margin (gross measurement) Visceral pleura invasion: Not identified. Tumor extension: Confined to lung parenchyma Treatment effect (if treated with neoadjuvant therapy):  N/A Lymph -Vascular invasion: Not identified Lymph nodes: Number examined - 7; Number N1 nodes positive 0; Number N2 nodes positive 0 TNM code: pT1a, pN0 Ancillary studies: N/A Comments: The squamous cell carcinoma is predominantly in-situ. However, there is a single focus suspicious for early invasion.   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 She is making good progress postoperatively. Increasing her physical activity appropriately level.  She denies any significant shortness of breath. Has had no fever chills. Her incisions are healing well.    Zubrod Score: At the time of surgery this patient's most appropriate activity status/level should be described as: '[x]'$     0    Normal activity, no symptoms '[]'$     1    Restricted in physical strenuous activity but ambulatory, able to do out light work '[]'$     2    Ambulatory and capable of self care, unable to do work activities, up and about >50 % of waking hours                              '[]'$     3    Only limited self care, in bed greater than 50% of waking hours '[]'$     4    Completely disabled, no self care, confined to bed or chair '[]'$     5    Moribund   History  Smoking status  . Former Smoker  . Types: Cigarettes  . Quit date: 08/08/1978  Smokeless tobacco  . Never Used    Comment: quit 35 yrs ago       No Known Allergies  Current Outpatient Prescriptions  Medication Sig Dispense Refill  . amLODipine (NORVASC) 5 MG tablet Take 5 mg by mouth daily.      . chlorthalidone (HYGROTON) 25 MG tablet Take 25 mg by mouth daily.      . Cholecalciferol (VITAMIN D-3 PO) Take 1 tablet by mouth 2 (two) times a week.       . esomeprazole (NEXIUM) 40 MG capsule Take 40 mg by mouth daily before breakfast.      . folic acid (FOLVITE) 1 MG tablet TAKE 1  TABLET (1 MG TOTAL) BY MOUTH DAILY.  30 tablet  3  . Multiple Vitamins-Minerals (PRESERVISION AREDS PO) Take 1 tablet by mouth daily.       Marland Kitchen oxyCODONE-acetaminophen (PERCOCET/ROXICET) 5-325 MG per tablet Take 1-2 tablets by mouth every 8 (eight) hours as needed for severe pain.  30 tablet  0  . simvastatin (ZOCOR) 20 MG tablet Take 20 mg by mouth every evening.           Physical Exam: BP 140/75 mmHg  Pulse 90  Resp 20  Ht '5\' 4"'$  (1.626 m)  Wt 145 lb (65.772 kg)  BMI 24.88 kg/m2  SpO2 98%  General appearance: alert and cooperative Neurologic: intact Heart: regular rate and rhythm, S1, S2 normal, no murmur, click, rub or gallop Lungs: clear to auscultation bilaterally Abdomen: soft, non-tender; bowel sounds normal; no masses,  no organomegaly Extremities: extremities normal, atraumatic, no cyanosis  or edema and Homans sign is negative, no sign of DVT Wound: Right chest port sites/incisions healing well, sutures removed Patient has no cervical or supraclavicular adenopathy  Diagnostic Studies & Laboratory data:         Recent Radiology Findings: Ct Chest W Contrast  09/09/2015  CLINICAL DATA:  Restaging right lung cancer. Initial diagnosis 2014. Status post right upper lobe lobectomy. EXAM: CT CHEST WITH CONTRAST TECHNIQUE: Multidetector CT imaging of the chest was performed during intravenous contrast administration. CONTRAST:  68m OMNIPAQUE IOHEXOL 300 MG/ML  SOLN COMPARISON:  Multiple prior chest CTs. The most recent is 03/05/2015. FINDINGS: Mediastinum/Nodes: No chest wall mass, breast mass, supraclavicular or axillary lymphadenopathy. The thyroid gland is grossly normal. The heart is normal in size. No pericardial effusion. The aorta is normal in caliber. No dissection. The pulmonary arteries are normal. No mediastinal or hilar mass or adenopathy. The esophagus is grossly normal. Lungs/Pleura: Stable emphysematous changes. Stable surgical changes from a right upper lobe lobectomy. No  findings for recurrent tumor. Stable tiny sub 3 mm subsolid nodules are again demonstrated. These are considered benign given their stability since 2015. No new pulmonary lesions. No acute pulmonary findings. No pleural effusion. Upper abdomen: No significant upper abdominal findings. Small hiatal hernia noted. The adrenal glands are normal in stable. Musculoskeletal: Stable scoliosis and degenerative changes involving the spine. No findings for metastatic bone disease. IMPRESSION: 1. Stable surgical changes from a right upper lobe lobectomy. No findings for recurrent tumor or metastatic disease. 2. A few small scattered stable sub 3 mm sub solid nodules are unchanged since 2015 and considered benign. 3. Stable emphysematous changes.  No acute pulmonary findings. Electronically Signed   By: PMarijo SanesM.D.   On: 09/09/2015 10:36   Ct Chest W Contrast  03/05/2015   CLINICAL DATA:  Right upper lobe lung cancer diagnosed December 2014. Chemotherapy completed March 2015. Right upper lobectomy. Restaging.  EXAM: CT CHEST WITH CONTRAST  TECHNIQUE: Multidetector CT imaging of the chest was performed during intravenous contrast administration.  CONTRAST:  6105mOMNIPAQUE IOHEXOL 300 MG/ML  SOLN  COMPARISON:  08/28/2014  FINDINGS: Mediastinum/Nodes: Aortic and branch vessel atherosclerotic calcification noted. No pathologic adenopathy.  Lungs/Pleura: Right upper lobectomy. Mild findings of centrilobular emphysema.  Sub solid 4 by 5 mm nodule in the left lower lobe, image 41 series 5, no change from 06/19/2013. 4 mm sub solid nodule in the left lower lobe on image 39 series 5, no change from 06/19/2013. 3 mm ground-glass nodular density in the left upper lobe, no change from 06/19/2013.  Upper abdomen: Unremarkable  Musculoskeletal: Levoconvex lumbar scoliosis is partially imaged, with rotary component.  IMPRESSION: 1. No recurrent malignancy identified. 2. Several tiny sub solid left pulmonary nodules are unchanged the  earliest comparable reference exam from 06/19/2013, and highly likely to be benign. These may merit attention on followup studies. 3. Atherosclerosis. 4. Levoconvex lumbar scoliosis.   Electronically Signed   By: WaVan Clines.D.   On: 03/05/2015 09:31    Recent Labs: Lab Results  Component Value Date   WBC 6.9 09/09/2015   HGB 13.5 09/09/2015   HCT 38.2 09/09/2015   PLT 219 09/09/2015   GLUCOSE 95 09/09/2015   ALT 21 09/09/2015   AST 25 09/09/2015   NA 136 09/09/2015   K 3.5 09/09/2015   CL 93* 07/14/2013   CREATININE 1.2* 09/09/2015   BUN 19.6 09/09/2015   CO2 28 09/09/2015   INR 0.89 07/03/2013   Chronic Kidney  Disease   Stage I     GFR >90  Stage II    GFR 60-89  Stage IIIA GFR 45-59  Stage IIIB GFR 30-44  Stage IV   GFR 15-29  Stage V    GFR  <15  Lab Results  Component Value Date   CREATININE 1.2* 09/09/2015   Estimated Creatinine Clearance: 40.7 mL/min (by C-G formula based on Cr of 1.2).   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

## 2015-09-20 ENCOUNTER — Encounter (HOSPITAL_COMMUNITY): Payer: Self-pay | Admitting: Emergency Medicine

## 2015-09-20 ENCOUNTER — Emergency Department (HOSPITAL_COMMUNITY)
Admission: EM | Admit: 2015-09-20 | Discharge: 2015-09-20 | Disposition: A | Discharge status: Home / Self Care | Payer: Medicare Other | Attending: Emergency Medicine | Admitting: Emergency Medicine

## 2015-09-20 ENCOUNTER — Emergency Department (HOSPITAL_COMMUNITY): Payer: Medicare Other

## 2015-09-20 DIAGNOSIS — N183 Chronic kidney disease, stage 3 (moderate): Secondary | ICD-10-CM | POA: Insufficient documentation

## 2015-09-20 DIAGNOSIS — S300XXA Contusion of lower back and pelvis, initial encounter: Secondary | ICD-10-CM | POA: Diagnosis not present

## 2015-09-20 DIAGNOSIS — M1990 Unspecified osteoarthritis, unspecified site: Secondary | ICD-10-CM | POA: Diagnosis not present

## 2015-09-20 DIAGNOSIS — K219 Gastro-esophageal reflux disease without esophagitis: Secondary | ICD-10-CM | POA: Diagnosis not present

## 2015-09-20 DIAGNOSIS — Z85118 Personal history of other malignant neoplasm of bronchus and lung: Secondary | ICD-10-CM | POA: Insufficient documentation

## 2015-09-20 DIAGNOSIS — Y92099 Unspecified place in other non-institutional residence as the place of occurrence of the external cause: Secondary | ICD-10-CM | POA: Diagnosis not present

## 2015-09-20 DIAGNOSIS — Z79899 Other long term (current) drug therapy: Secondary | ICD-10-CM | POA: Diagnosis not present

## 2015-09-20 DIAGNOSIS — S2232XA Fracture of one rib, left side, initial encounter for closed fracture: Secondary | ICD-10-CM | POA: Diagnosis not present

## 2015-09-20 DIAGNOSIS — Y9389 Activity, other specified: Secondary | ICD-10-CM | POA: Diagnosis not present

## 2015-09-20 DIAGNOSIS — Z87891 Personal history of nicotine dependence: Secondary | ICD-10-CM | POA: Insufficient documentation

## 2015-09-20 DIAGNOSIS — Z86010 Personal history of colonic polyps: Secondary | ICD-10-CM | POA: Diagnosis not present

## 2015-09-20 DIAGNOSIS — I129 Hypertensive chronic kidney disease with stage 1 through stage 4 chronic kidney disease, or unspecified chronic kidney disease: Secondary | ICD-10-CM | POA: Diagnosis not present

## 2015-09-20 DIAGNOSIS — W01198A Fall on same level from slipping, tripping and stumbling with subsequent striking against other object, initial encounter: Secondary | ICD-10-CM | POA: Diagnosis not present

## 2015-09-20 DIAGNOSIS — Y998 Other external cause status: Secondary | ICD-10-CM | POA: Insufficient documentation

## 2015-09-20 DIAGNOSIS — E785 Hyperlipidemia, unspecified: Secondary | ICD-10-CM | POA: Diagnosis not present

## 2015-09-20 DIAGNOSIS — S29001A Unspecified injury of muscle and tendon of front wall of thorax, initial encounter: Secondary | ICD-10-CM | POA: Diagnosis present

## 2015-09-20 DIAGNOSIS — Z8709 Personal history of other diseases of the respiratory system: Secondary | ICD-10-CM | POA: Insufficient documentation

## 2015-09-20 MED ORDER — OXYCODONE-ACETAMINOPHEN 5-325 MG PO TABS: 1.0000 | ORAL_TABLET | ORAL | Status: DC | PRN

## 2015-09-20 MED ORDER — OXYCODONE-ACETAMINOPHEN 5-325 MG PO TABS
2.0000 | ORAL_TABLET | Freq: Once | ORAL | Status: AC
  Administered 2015-09-20: 2 via ORAL
  Filled 2015-09-20: qty 2

## 2015-09-20 MED ORDER — METHOCARBAMOL 500 MG PO TABS: 500.0000 mg | ORAL_TABLET | Freq: Three times a day (TID) | ORAL | Status: DC | PRN

## 2015-09-20 MED ORDER — DIAZEPAM 5 MG PO TABS
5.0000 mg | ORAL_TABLET | Freq: Once | ORAL | Status: AC
  Administered 2015-09-20: 5 mg via ORAL
  Filled 2015-09-20: qty 1

## 2015-09-20 NOTE — ED Provider Notes (Signed)
CSN: [id number]     Arrival date & time 09/20/15  0745 History   First MD Initiated Contact with Patient 09/20/15 0750     Chief Complaint  Patient presents with  . Fall  . Rib Injury      HPI  Patient presents for evaluation of left flank back pain after a fall 2 nights ago. Today is Sunday. On Friday night she states that they have been out. She came home and went to bed. During the night she got up to use the restroom and lost her balance. She fell with her left side against a bedside table. Has pain in the left flank and left lower back with visible bruising. Pain with breathing. No cough or hemoptysis. No abdominal pain or nausea or vomiting. She is having some spasms of pain and pain with bending twisting moving or touching  Did not strike her head. No neck pain. Other family symptoms. No radiation of pain to the legs.  Past Medical History  Diagnosis Date  . Hyperlipidemia     takes Zocor daily  . Cavitating mass in right upper lung lobe   . Lung nodules     right upper lobe  . GERD (gastroesophageal reflux disease)     takes Nexium daily  . Hypertension     takes Amlodipine daily and Chlorthalidone as well  . Dizziness     occasionally   . Arthritis     back   . History of colon polyps   . Diverticulosis   . Family history of kidney infection     pt history-saw Dr.Wrenn  . Nocturia   . Lung cancer, Right upper lobe   . CKD (chronic kidney disease) stage 3, GFR 30-59 ml/min 03/12/2015   Past Surgical History  Procedure Laterality Date  . Cardiovascular stress test  06/29/2009    EF 79%, NO ISCHEMIA  . Tracheostomy      at age 75 or 5 d/t polyps on vocal cord  . Abdominal hysterectomy      at age 40;partial   . Colonoscopy    . Video bronchoscopy N/A 07/08/2013    Procedure: VIDEO BRONCHOSCOPY;  Surgeon: [name], MD;  Location: [facility] OR;  Service: Thoracic;  Laterality: N/A;  . Video assisted thoracoscopy (vats)/wedge resection Right 07/08/2013    Procedure:  VIDEO ASSISTED THORACOSCOPY (VATS)/WEDGE RESECTION;  Surgeon: [name], MD;  Location: [facility];  Service: Thoracic;  Laterality: Right;   Family History  Problem Relation Age of Onset  . Heart attack Father   . Cancer Brother     stomach   Social History  Substance Use Topics  . Smoking status: Former Smoker    Types: Cigarettes    Quit date: 08/08/1978  . Smokeless tobacco: Never Used     Comment: quit 35 yrs ago  . Alcohol Use: Yes     Comment: several beers a day and shots of crown   OB History    No data available     Review of Systems  Constitutional: Negative for fever, chills, diaphoresis, appetite change and fatigue.  HENT: Negative for mouth sores, sore throat and trouble swallowing.   Eyes: Negative for visual disturbance.  Respiratory: Negative for cough, chest tightness, shortness of breath and wheezing.        Pain with breathing left flank.  Cardiovascular: Negative for chest pain.  Gastrointestinal: Negative for nausea, vomiting, abdominal pain, diarrhea and abdominal distention.  Endocrine: Negative for polydipsia, polyphagia and  polyuria.  Genitourinary: Positive for flank pain. Negative for dysuria, frequency, hematuria and decreased urine volume.  Musculoskeletal: Positive for back pain. Negative for gait problem.  Skin: Negative for color change, pallor and rash.  Neurological: Negative for dizziness, syncope, light-headedness and headaches.  Hematological: Does not bruise/bleed easily.  Psychiatric/Behavioral: Negative for behavioral problems and confusion.      Allergies  Review of patient's allergies indicates no known allergies.  Home Medications   Prior to Admission medications   Medication Sig Start Date End Date Taking? Authorizing Provider  acetaminophen (TYLENOL) 500 MG tablet Take 500 mg by mouth 3 (three) times daily as needed for mild pain.   Yes Historical Provider, MD  alendronate (FOSAMAX) 70 MG tablet Take 70 mg by mouth  every Monday.  09/14/15  Yes Historical Provider, MD  amLODipine (NORVASC) 5 MG tablet Take 5 mg by mouth daily.   Yes Historical Provider, MD  chlorthalidone (HYGROTON) 25 MG tablet Take 12.5 mg by mouth daily. 07/23/15  Yes Historical Provider, MD  esomeprazole (NEXIUM) 40 MG capsule Take 40 mg by mouth daily before breakfast.   Yes Historical Provider, MD  KLOR-CON 10 10 MEQ tablet Take 10 mEq by mouth daily. 06/25/15  Yes Historical Provider, MD  LORazepam (ATIVAN) 0.5 MG tablet Take 0.5 mg by mouth at bedtime as needed. for sleep 08/20/14  Yes Historical Provider, MD  Multiple Vitamins-Minerals (PRESERVISION AREDS PO) Take 1 tablet by mouth daily.    Yes Historical Provider, MD  simvastatin (ZOCOR) 20 MG tablet Take 20 mg by mouth every evening.   Yes Historical Provider, MD  methocarbamol (ROBAXIN) 500 MG tablet Take 1 tablet (500 mg total) by mouth 3 (three) times daily between meals as needed. 09/20/15   Tanna Furry, MD  oxyCODONE-acetaminophen (PERCOCET/ROXICET) 5-325 MG tablet Take 1-2 tablets by mouth every 4 (four) hours as needed. 09/20/15   Tanna Furry, MD   BP 153/87 mmHg  Pulse 98  Temp(Src) 97.8 F (36.6 C) (Oral)  SpO2 97% Physical Exam  Constitutional: She is oriented to person, place, and time. She appears well-developed and well-nourished. No distress.  HENT:  Head: Normocephalic.  Eyes: Conjunctivae are normal. Pupils are equal, round, and reactive to light. No scleral icterus.  Neck: Normal range of motion. Neck supple. No thyromegaly present.  Cardiovascular: Normal rate and regular rhythm.  Exam reveals no gallop and no friction rub.   No murmur heard. Pulmonary/Chest: Effort normal and breath sounds normal. No respiratory distress. She has no wheezes. She has no rales.    Abdominal: Soft. Bowel sounds are normal. She exhibits no distension. There is no tenderness. There is no rebound.  Musculoskeletal: Normal range of motion.       Back:  Neurological: She is alert  and oriented to person, place, and time.  Skin: Skin is warm and dry. No rash noted.  Psychiatric: She has a normal mood and affect. Her behavior is normal.    ED Course  Procedures (including critical care time) Labs Review Labs Reviewed - No data to display  Imaging Review Dg Ribs Unilateral W/chest Left  09/20/2015  CLINICAL DATA:  Pain following fall one day prior EXAM: LEFT RIBS AND CHEST - 3+ VIEW COMPARISON:  Chest radiograph November 28, 2013; chest CT September 09, 2015 FINDINGS: Frontal chest as well as oblique and cone-down lower rib images were obtained. There is stable scarring in both lung bases, slightly more on the right than on the left. There is no edema or  consolidation. The heart size and pulmonary vascularity are normal. There is postoperative change on the right. No adenopathy is apparent. There is lower thoracic levoscoliosis with degenerative change. There is no demonstrable pneumothorax or effusion. There is a mildly displaced fracture of the anterolateral left twelfth rib. There is a nondisplaced fracture of the antrerolateral left eleventh rib. No other fractures are evident. IMPRESSION: Mildly displaced fracture anterolateral left twelfth rib. Nondisplaced fracture anterolateral left eleventh rib. No pneumothorax or appreciable effusion. Areas of lung scarring but no edema or consolidation. Electronically Signed   By: Lowella Grip III M.D.   On: 09/20/2015 09:02   Dg Lumbar Spine Complete  09/20/2015  CLINICAL DATA:  Fall getting up to go the bathroom. Chronic back pain from scoliosis. EXAM: LUMBAR SPINE - COMPLETE 4+ VIEW COMPARISON:  Lumbar spine radiographs - 09/16/2009; PET-CT - 07/01/2013 FINDINGS: There are 5 non rib-bearing lumbar type vertebral bodies. Moderate to severe scoliotic curvature of the lumbar spine, with dominant caudal component convex to the left measuring approximately 41 degrees (as measured from the superior endplate of R42 to the inferior endplate  of L1), unchanged since the 04/14/2010 examination. There is approximately 1.2 cm of left lateral deviation of the L3 vertebral body in relation to the L4 vertebral body, potentially accentuated due to obliquity. There is straightening expected lumbar lordosis. No definite anterolisthesis or retrolisthesis given obliquity. Lumbar vertebral body heights are grossly preserved given obliquity. Interval progression of now moderate to severe multilevel lumbar spine DDD, worse about central concavity of the scoliotic curvature, most conspicuous about the right-side of the L1-L2, L2-L3 and L4-L5 intervertebral disc spaces. Posteriorly directed disc osteophyte complex are seen at multiple levels, worse at L2-L3 and L4-L5 with posterior disc osteophytosis. Limited visualization the bilateral SI joints and hips is normal. Paucity of bowel gas without evidence of enteric obstruction. Calcified atherosclerotic plaque within the abdominal aorta. IMPRESSION: 1. No acute findings. 2. Moderate to severe scoliotic curvature of the lumbar spine measuring approximately 41 degrees, unchanged since the 04/2010 examination. 3. Interval progression of now moderate to severe multilevel lumbar spine DDD worse about the central concavity of the scoliotic curvature. Electronically Signed   By: Sandi Mariscal M.D.   On: 09/20/2015 09:09   I have personally reviewed and evaluated these images and lab results as part of my medical decision-making.   EKG Interpretation None      MDM   Final diagnoses:  Rib fracture, left, closed, initial encounter    No crepitus. Extensive bruising. No direct spinal process tenderness. Plan will be x-rays pain control and spasmodic. Reevaluation after x-rays.  Patient with significant relief after medications. Breathing without pain. 2 inferior left rib fractures. Marked scoliosis of the spine and degenerative changes but no acute abdomen amount is noted. Instructed in use of a Nissen a spirometer.  One was dispensed to her for home use. Plan is home, do not rapper tape the ribs. Cough and deep breathe or use spirometer 10 each hour for pulmonary toilet. There was worsening pain fever shortness of breath.    Tanna Furry, MD 09/20/15 1044

## 2015-09-20 NOTE — Discharge Instructions (Signed)
Cough and deep breathe, or use spirometer every hour while awake to keep lungs inflated.  Rib Fracture A rib fracture is a break or crack in one of the bones of the ribs. The ribs are a group of long, curved bones that wrap around your chest and attach to your spine. They protect your lungs and other organs in the chest cavity. A broken or cracked rib is often painful, but most do not cause other problems. Most rib fractures heal on their own over time. However, rib fractures can be more serious if multiple ribs are broken or if broken ribs move out of place and push against other structures. CAUSES   A direct blow to the chest. For example, this could happen during contact sports, a car accident, or a fall against a hard object.  Repetitive movements with high force, such as pitching a baseball or having severe coughing spells. SYMPTOMS   Pain when you breathe in or cough.  Pain when someone presses on the injured area. DIAGNOSIS  Your caregiver will perform a physical exam. Various imaging tests may be ordered to confirm the diagnosis and to look for related injuries. These tests may include a chest X-ray, computed tomography (CT), magnetic resonance imaging (MRI), or a bone scan. TREATMENT  Rib fractures usually heal on their own in 1-3 months. The longer healing period is often associated with a continued cough or other aggravating activities. During the healing period, pain control is very important. Medication is usually given to control pain. Hospitalization or surgery may be needed for more severe injuries, such as those in which multiple ribs are broken or the ribs have moved out of place.  HOME CARE INSTRUCTIONS   Avoid strenuous activity and any activities or movements that cause pain. Be careful during activities and avoid bumping the injured rib.  Gradually increase activity as directed by your caregiver.  Only take over-the-counter or prescription medications as directed by your  caregiver. Do not take other medications without asking your caregiver first.  Apply ice to the injured area for the first 1-2 days after you have been treated or as directed by your caregiver. Applying ice helps to reduce inflammation and pain.  Put ice in a plastic bag.  Place a towel between your skin and the bag.   Leave the ice on for 15-20 minutes at a time, every 2 hours while you are awake.  Perform deep breathing as directed by your caregiver. This will help prevent pneumonia, which is a common complication of a broken rib. Your caregiver may instruct you to:  Take deep breaths several times a day.  Try to cough several times a day, holding a pillow against the injured area.  Use a device called an incentive spirometer to practice deep breathing several times a day.  Drink enough fluids to keep your urine clear or pale yellow. This will help you avoid constipation.   Do not wear a rib belt or binder. These restrict breathing, which can lead to pneumonia.  SEEK IMMEDIATE MEDICAL CARE IF:   You have a fever.   You have difficulty breathing or shortness of breath.   You develop a continual cough, or you cough up thick or bloody sputum.  You feel sick to your stomach (nausea), throw up (vomit), or have abdominal pain.   You have worsening pain not controlled with medications.  MAKE SURE YOU:  Understand these instructions.  Will watch your condition.  Will get help right away if  you are not doing well or get worse.   This information is not intended to replace advice given to you by your health care provider. Make sure you discuss any questions you have with your health care provider.   Document Released: 07/25/2005 Document Revised: 03/27/2013 Document Reviewed: 09/26/2012 Elsevier Interactive Patient Education [employer].

## 2015-09-20 NOTE — ED Notes (Signed)
Pt from home with c/o left sided rib and left sided back pain s/p fall this past Friday night.  Pt has bruising over most of left side with pain and tenderness.  Lungs clear.  NAD, A&O.

## 2016-03-15 ENCOUNTER — Other Ambulatory Visit (HOSPITAL_BASED_OUTPATIENT_CLINIC_OR_DEPARTMENT_OTHER): Payer: Medicare Other

## 2016-03-15 ENCOUNTER — Ambulatory Visit (HOSPITAL_COMMUNITY)
Admission: RE | Admit: 2016-03-15 | Discharge: 2016-03-15 | Disposition: A | Discharge status: Home / Self Care | Payer: Medicare Other | Source: Ambulatory Visit | Attending: Internal Medicine | Admitting: Internal Medicine

## 2016-03-15 DIAGNOSIS — Z902 Acquired absence of lung [part of]: Secondary | ICD-10-CM | POA: Insufficient documentation

## 2016-03-15 DIAGNOSIS — C3411 Malignant neoplasm of upper lobe, right bronchus or lung: Secondary | ICD-10-CM

## 2016-03-15 LAB — CBC WITH DIFFERENTIAL/PLATELET
BASO%: 0.5 % (ref 0.0–2.0)
Basophils Absolute: 0 10*3/uL (ref 0.0–0.1)
EOS ABS: 0.4 10*3/uL (ref 0.0–0.5)
EOS%: 5.5 % (ref 0.0–7.0)
HCT: 35.5 % (ref 34.8–46.6)
HGB: 12.6 g/dL (ref 11.6–15.9)
LYMPH%: 36.2 % (ref 14.0–49.7)
MCH: 33.2 pg (ref 25.1–34.0)
MCHC: 35.5 g/dL (ref 31.5–36.0)
MCV: 93.7 fL (ref 79.5–101.0)
MONO#: 0.6 10*3/uL (ref 0.1–0.9)
MONO%: 9.4 % (ref 0.0–14.0)
NEUT%: 48.4 % (ref 38.4–76.8)
NEUTROS ABS: 3.1 10*3/uL (ref 1.5–6.5)
Platelets: 221 10*3/uL (ref 145–400)
RBC: 3.79 10*6/uL (ref 3.70–5.45)
RDW: 12.8 % (ref 11.2–14.5)
WBC: 6.4 10*3/uL (ref 3.9–10.3)
lymph#: 2.3 10*3/uL (ref 0.9–3.3)

## 2016-03-15 LAB — COMPREHENSIVE METABOLIC PANEL
ALT: 18 U/L (ref 0–55)
AST: 22 U/L (ref 5–34)
Albumin: 4.1 g/dL (ref 3.5–5.0)
Alkaline Phosphatase: 84 U/L (ref 40–150)
Anion Gap: 11 mEq/L (ref 3–11)
BUN: 16.8 mg/dL (ref 7.0–26.0)
CO2: 28 meq/L (ref 22–29)
Calcium: 9.8 mg/dL (ref 8.4–10.4)
Chloride: 95 mEq/L — ABNORMAL LOW (ref 98–109)
Creatinine: 1.1 mg/dL (ref 0.6–1.1)
EGFR: 53 mL/min/{1.73_m2} — AB (ref >90)
GLUCOSE: 88 mg/dL (ref 70–140)
POTASSIUM: 3.8 meq/L (ref 3.5–5.1)
SODIUM: 134 meq/L — AB (ref 136–145)
TOTAL PROTEIN: 6.8 g/dL (ref 6.4–8.3)
Total Bilirubin: 0.56 mg/dL (ref 0.20–1.20)

## 2016-03-15 MED ORDER — IOPAMIDOL (ISOVUE-300) INJECTION 61%
75.0000 mL | Freq: Once | INTRAVENOUS | Status: AC | PRN
  Administered 2016-03-15: 75 mL via INTRAVENOUS

## 2016-03-22 ENCOUNTER — Ambulatory Visit (HOSPITAL_BASED_OUTPATIENT_CLINIC_OR_DEPARTMENT_OTHER): Payer: Medicare Other | Admitting: Internal Medicine

## 2016-03-22 ENCOUNTER — Encounter: Payer: Self-pay | Admitting: Internal Medicine

## 2016-03-22 ENCOUNTER — Telehealth: Payer: Self-pay | Admitting: Internal Medicine

## 2016-03-22 VITALS — BP 211/97 | HR 80 | Temp 99.1°F | Resp 18 | Ht 64.0 in | Wt 146.9 lb

## 2016-03-22 DIAGNOSIS — I10 Essential (primary) hypertension: Secondary | ICD-10-CM

## 2016-03-22 DIAGNOSIS — C3411 Malignant neoplasm of upper lobe, right bronchus or lung: Secondary | ICD-10-CM

## 2016-03-22 MED ORDER — CLONIDINE HCL 0.1 MG PO TABS
0.2000 mg | ORAL_TABLET | Freq: Once | ORAL | Status: AC
  Administered 2016-03-22: 0.2 mg via ORAL

## 2016-03-22 MED ORDER — CLONIDINE HCL 0.1 MG PO TABS
ORAL_TABLET | ORAL | Status: AC
  Filled 2016-03-22: qty 2

## 2016-03-22 NOTE — Progress Notes (Signed)
Call placed to pt's PCP Dr. [name] [phone] pt will be seen upon arrival today to reassess Blood Pressure.

## 2016-03-22 NOTE — Progress Notes (Signed)
[facility] Telephone:([phone]) [phone]   Fax:(336) [phone]  OFFICE PROGRESS NOTE  [name], [place], [place] [address]  DIAGNOSIS: Cancer of upper lobe of lung  Primary site: Lung (Right)  Staging method: AJCC 7th Edition  Clinical: (T1a, N0)  Pathologic: Stage IA (T1a, N0, cM0) signed by [name], MD on 07/09/2013 1:58 PM  Summary: Stage IA (T1a, N0, cM0)  Lung cancer, Right upper lobe  Primary site: Lung (Right)  Pathologic: Stage IB (T2a, N0, cM0) signed by [name], MD on 07/09/2013 1:54 PM  Summary: Stage IB (T2a, N0, cM0)   PRIOR THERAPY:  1) Status post right video-assisted thoracoscopy, wedge resection, right upper lobe with completion of right upper lobectomy with lymph node dissection under the care of Dr. Servando Snare on 07/08/2013. 2) Adjuvant chemotherapy with systemic chemotherapy in the form of cisplatin at 75 mg per meter squared and Alimta 500 mg per meter squared given every 3 weeks for a total of 4 cycles, status post 4 cycles. Last cycle was given 10/23/2013.  CURRENT THERAPY: Observation.  CHEMOTHERAPY INTENT: Curative  CURRENT # OF CHEMOTHERAPY CYCLES: 0 CURRENT ANTIEMETICS: Aloxi, dexamethasone, Compazine  CURRENT SMOKING STATUS: Former smoker, quit 08/08/1978  ORAL CHEMOTHERAPY AND CONSENT: n/a  CURRENT BISPHOSPHONATES USE: none  PAIN MANAGEMENT: Percocent  NARCOTICS INDUCED CONSTIPATION: none  LIVING WILL AND CODE STATUS: ?    INTERVAL HISTORY: [name] 70 y.o. female returns to the clinic today for 6 months followup visit accompanied by her husband. The patient is feeling fine today with no specific complaints. The patient denied having any nausea or vomiting. She has no fever or chills. She has no significant chest pain, shortness of breath, cough or hemoptysis. No significant weight loss or night sweats. Her blood pressure is still not well-controlled. She is scheduled to see her  primary care physician next week for reevaluation. She has repeat CT scan of the chest performed recently and she is here for evaluation and discussion of her scan results.  MEDICAL HISTORY: Past Medical History:  Diagnosis Date  . Arthritis    back   . Cavitating mass in right upper lung lobe   . CKD (chronic kidney disease) stage 3, GFR 30-59 ml/min 03/12/2015  . Diverticulosis   . Dizziness    occasionally   . Family history of kidney infection    pt history-saw Dr.Wrenn  . GERD (gastroesophageal reflux disease)    takes Nexium daily  . History of colon polyps   . Hyperlipidemia    takes Zocor daily  . Hypertension    takes Amlodipine daily and Chlorthalidone as well  . Lung cancer, Right upper lobe   . Lung nodules    right upper lobe  . Nocturia     ALLERGIES:  has No Known Allergies.  MEDICATIONS:  Current Outpatient Prescriptions  Medication Sig Dispense Refill  . acetaminophen (TYLENOL) 500 MG tablet Take 500 mg by mouth 3 (three) times daily as needed for mild pain.    Marland Kitchen alendronate (FOSAMAX) 70 MG tablet Take 70 mg by mouth every Monday.     Marland Kitchen amLODipine (NORVASC) 5 MG tablet Take 5 mg by mouth daily.    . chlorthalidone (HYGROTON) 25 MG tablet Take 12.5 mg by mouth daily.    Marland Kitchen esomeprazole (NEXIUM) 40 MG capsule Take 40 mg by mouth daily before breakfast.    . KLOR-CON 10 10 MEQ tablet Take 10 mEq by  mouth daily.  4  . LORazepam (ATIVAN) 0.5 MG tablet Take 0.5 mg by mouth at bedtime as needed. for sleep  5  . methocarbamol (ROBAXIN) 500 MG tablet Take 1 tablet (500 mg total) by mouth 3 (three) times daily between meals as needed. 20 tablet 0  . Multiple Vitamins-Minerals (PRESERVISION AREDS PO) Take 1 tablet by mouth daily.     Marland Kitchen oxyCODONE-acetaminophen (PERCOCET/ROXICET) 5-325 MG tablet Take 1-2 tablets by mouth every 4 (four) hours as needed. 30 tablet 0  . simvastatin (ZOCOR) 20 MG tablet Take 20 mg by mouth every evening.     No current facility-administered  medications for this visit.     SURGICAL HISTORY:  Past Surgical History:  Procedure Laterality Date  . ABDOMINAL HYSTERECTOMY     at age 25;partial   . CARDIOVASCULAR STRESS TEST  06/29/2009   EF 79%, NO ISCHEMIA  . COLONOSCOPY    . TRACHEOSTOMY     at age 15 or 5 d/t polyps on vocal cord  . VIDEO ASSISTED THORACOSCOPY (VATS)/WEDGE RESECTION Right 07/08/2013   Procedure: VIDEO ASSISTED THORACOSCOPY (VATS)/WEDGE RESECTION;  Surgeon: [name], MD;  Location: [facility];  Service: Thoracic;  Laterality: Right;  Marland Kitchen VIDEO BRONCHOSCOPY N/A 07/08/2013   Procedure: VIDEO BRONCHOSCOPY;  Surgeon: [name], MD;  Location: [facility] OR;  Service: Thoracic;  Laterality: N/A;    REVIEW OF SYSTEMS:  A comprehensive review of systems was negative.   PHYSICAL EXAMINATION: General appearance: alert, cooperative and no distress Head: Normocephalic, without obvious abnormality, atraumatic Neck: no adenopathy, no JVD, supple, symmetrical, trachea midline and thyroid not enlarged, symmetric, no tenderness/mass/nodules Lymph nodes: Cervical, supraclavicular, and axillary nodes normal. Resp: clear to auscultation bilaterally Back: symmetric, no curvature. ROM normal. No CVA tenderness. Cardio: regular rate and rhythm, S1, S2 normal, no murmur, click, rub or gallop GI: soft, non-tender; bowel sounds normal; no masses,  no organomegaly Extremities: extremities normal, atraumatic, no cyanosis or edema  ECOG PERFORMANCE STATUS: 1 - Symptomatic but completely ambulatory  Blood pressure (!) 180/76, pulse 80, temperature 99.1 F (37.3 C), temperature source Oral, resp. rate 18, height '5\' 4"'$  (1.626 m), weight 146 lb 14.4 oz (66.6 kg), SpO2 99 %.  LABORATORY DATA: Lab Results  Component Value Date   WBC 6.4 03/15/2016   HGB 12.6 03/15/2016   HCT 35.5 03/15/2016   MCV 93.7 03/15/2016   PLT 221 03/15/2016      Chemistry      Component Value Date/Time   NA 134 (L) 03/15/2016 0753   K 3.8 03/15/2016  0753   CL 93 (L) 07/14/2013 0452   CO2 28 03/15/2016 0753   BUN 16.8 03/15/2016 0753   CREATININE 1.1 03/15/2016 0753      Component Value Date/Time   CALCIUM 9.8 03/15/2016 0753   ALKPHOS 84 03/15/2016 0753   AST 22 03/15/2016 0753   ALT 18 03/15/2016 0753   BILITOT 0.56 03/15/2016 0753       RADIOGRAPHIC STUDIES: Ct Chest W Contrast  Result Date: 03/15/2016 CLINICAL DATA:  Followup lung cancer EXAM: CT CHEST WITH CONTRAST TECHNIQUE: Multidetector CT imaging of the chest was performed during intravenous contrast administration. CONTRAST:  41m ISOVUE-300 IOPAMIDOL (ISOVUE-300) INJECTION 61% COMPARISON:  09/09/2015 FINDINGS: Cardiovascular: The heart size appears normal. There is no pericardial effusion identified. Aortic atherosclerosis. Mediastinum/Nodes: The trachea appears patent and is midline. Unremarkable appearance of the esophagus. There is no mediastinal or hilar adenopathy identified. No supraclavicular or axillary adenopathy. Lungs/Pleura: A small amount  of scratch set there are postsurgical changes from right upper lobectomy. Mild changes of centrilobular emphysema noted. Tiny nodule in the left upper lobe measures 5 mm, image 49 of series 5. Unchanged from previous exam. The tiny Upper Abdomen: The liver and spleen appear normal. Normal appearance of the adrenal glands. The visualized portions of the pancreas appear normal. Chronic appearing cortical scarring is noted involving both kidneys. Musculoskeletal: Scoliosis deformity is identified involving the thoracic spine. No aggressive lytic or sclerotic bone lesions identified. IMPRESSION: 1. Stable CT of the chest. 2. Postsurgical changes from right upper lobectomy. No findings for recurrent tumor or metastatic disease. Electronically Signed   By: Kerby Moors M.D.   On: 03/15/2016 10:14   ASSESSMENT AND PLAN: This is a very pleasant 70 years old white female with synchronous non-small cell lung cancer presenting with stage IA as  well as a stage IB status post right upper lobectomy with lymph node dissection and currently undergoing adjuvant chemotherapy with cisplatin and Alimta status post 4 cycles completed in March 2015. The patient has no complaints today. Her CT scan of the chest performed on 09/09/2015 showed no evidence for disease recurrence. I discussed the scan results with the patient and her husband today.  I recommended for her to continue on observation with repeat CT scan of the chest in one year for reevaluation of her disease. For hypertension, I will give the patient a dose of clonidine 0.2 mg by mouth 1 and I strongly encouraged the patient to take her blood pressure medication and to reconsult with her primary care physician to adjust her antihypertensive medication. She was advised to call immediately she has any concerning symptoms in the interval. The patient voices understanding of current disease status and treatment options and is in agreement with the current care plan.  All questions were answered. The patient knows to call the clinic with any problems, questions or concerns. We can certainly see the patient much sooner if necessary.  Disclaimer: This note was dictated with voice recognition software. Similar sounding words can inadvertently be transcribed and may not be corrected upon review.

## 2016-03-22 NOTE — Telephone Encounter (Signed)
GAVE PATIENT AVS REPORT AND APPOINTMENTS FOR AUGUST 2018.

## 2016-05-10 ENCOUNTER — Emergency Department (HOSPITAL_COMMUNITY): Payer: Medicare Other

## 2016-05-10 ENCOUNTER — Encounter (HOSPITAL_COMMUNITY): Payer: Self-pay | Admitting: Emergency Medicine

## 2016-05-10 ENCOUNTER — Other Ambulatory Visit: Payer: Self-pay

## 2016-05-10 ENCOUNTER — Inpatient Hospital Stay (HOSPITAL_COMMUNITY)
Admission: EM | Admit: 2016-05-10 | Discharge: 2016-05-13 | DRG: 641 | Disposition: A | Discharge status: Home / Self Care | Payer: Medicare Other | Attending: Internal Medicine | Admitting: Internal Medicine

## 2016-05-10 DIAGNOSIS — Z902 Acquired absence of lung [part of]: Secondary | ICD-10-CM

## 2016-05-10 DIAGNOSIS — I10 Essential (primary) hypertension: Secondary | ICD-10-CM | POA: Diagnosis present

## 2016-05-10 DIAGNOSIS — Z90711 Acquired absence of uterus with remaining cervical stump: Secondary | ICD-10-CM

## 2016-05-10 DIAGNOSIS — Z9221 Personal history of antineoplastic chemotherapy: Secondary | ICD-10-CM

## 2016-05-10 DIAGNOSIS — C3410 Malignant neoplasm of upper lobe, unspecified bronchus or lung: Secondary | ICD-10-CM | POA: Diagnosis present

## 2016-05-10 DIAGNOSIS — R55 Syncope and collapse: Secondary | ICD-10-CM | POA: Diagnosis not present

## 2016-05-10 DIAGNOSIS — N183 Chronic kidney disease, stage 3 unspecified: Secondary | ICD-10-CM | POA: Diagnosis present

## 2016-05-10 DIAGNOSIS — E785 Hyperlipidemia, unspecified: Secondary | ICD-10-CM | POA: Diagnosis present

## 2016-05-10 DIAGNOSIS — I129 Hypertensive chronic kidney disease with stage 1 through stage 4 chronic kidney disease, or unspecified chronic kidney disease: Secondary | ICD-10-CM | POA: Diagnosis present

## 2016-05-10 DIAGNOSIS — E873 Alkalosis: Secondary | ICD-10-CM | POA: Diagnosis present

## 2016-05-10 DIAGNOSIS — Z85118 Personal history of other malignant neoplasm of bronchus and lung: Secondary | ICD-10-CM

## 2016-05-10 DIAGNOSIS — I6523 Occlusion and stenosis of bilateral carotid arteries: Secondary | ICD-10-CM | POA: Diagnosis present

## 2016-05-10 DIAGNOSIS — G2581 Restless legs syndrome: Secondary | ICD-10-CM | POA: Diagnosis present

## 2016-05-10 DIAGNOSIS — Z8249 Family history of ischemic heart disease and other diseases of the circulatory system: Secondary | ICD-10-CM

## 2016-05-10 DIAGNOSIS — Z87891 Personal history of nicotine dependence: Secondary | ICD-10-CM

## 2016-05-10 DIAGNOSIS — E784 Other hyperlipidemia: Secondary | ICD-10-CM | POA: Diagnosis not present

## 2016-05-10 DIAGNOSIS — E876 Hypokalemia: Secondary | ICD-10-CM | POA: Diagnosis not present

## 2016-05-10 DIAGNOSIS — K219 Gastro-esophageal reflux disease without esophagitis: Secondary | ICD-10-CM | POA: Diagnosis present

## 2016-05-10 DIAGNOSIS — E871 Hypo-osmolality and hyponatremia: Secondary | ICD-10-CM | POA: Diagnosis not present

## 2016-05-10 DIAGNOSIS — Z808 Family history of malignant neoplasm of other organs or systems: Secondary | ICD-10-CM

## 2016-05-10 DIAGNOSIS — T502X5A Adverse effect of carbonic-anhydrase inhibitors, benzothiadiazides and other diuretics, initial encounter: Secondary | ICD-10-CM | POA: Diagnosis present

## 2016-05-10 DIAGNOSIS — Z23 Encounter for immunization: Secondary | ICD-10-CM

## 2016-05-10 DIAGNOSIS — Z79899 Other long term (current) drug therapy: Secondary | ICD-10-CM

## 2016-05-10 LAB — I-STAT CHEM 8, ED
BUN: 15 mg/dL (ref 6–20)
CREATININE: 1.1 mg/dL — AB (ref 0.44–1.00)
Calcium, Ion: 1.13 mmol/L — ABNORMAL LOW (ref 1.15–1.40)
Chloride: 83 mmol/L — ABNORMAL LOW (ref 101–111)
Glucose, Bld: 110 mg/dL — ABNORMAL HIGH (ref 65–99)
HEMATOCRIT: 38 % (ref 36.0–46.0)
Hemoglobin: 12.9 g/dL (ref 12.0–15.0)
POTASSIUM: 3 mmol/L — AB (ref 3.5–5.1)
SODIUM: 121 mmol/L — AB (ref 135–145)
TCO2: 26 mmol/L (ref 0–100)

## 2016-05-10 LAB — COMPREHENSIVE METABOLIC PANEL
ALT: 21 U/L (ref 14–54)
ANION GAP: 10 (ref 5–15)
AST: 28 U/L (ref 15–41)
Albumin: 4.3 g/dL (ref 3.5–5.0)
Alkaline Phosphatase: 82 U/L (ref 38–126)
BUN: 14 mg/dL (ref 6–20)
CHLORIDE: 85 mmol/L — AB (ref 101–111)
CO2: 27 mmol/L (ref 22–32)
CREATININE: 1.07 mg/dL — AB (ref 0.44–1.00)
Calcium: 9.6 mg/dL (ref 8.9–10.3)
GFR, EST AFRICAN AMERICAN: 60 mL/min — AB (ref >60)
GFR, EST NON AFRICAN AMERICAN: 51 mL/min — AB (ref >60)
Glucose, Bld: 110 mg/dL — ABNORMAL HIGH (ref 65–99)
POTASSIUM: 3.1 mmol/L — AB (ref 3.5–5.1)
SODIUM: 122 mmol/L — AB (ref 135–145)
Total Bilirubin: 0.7 mg/dL (ref 0.3–1.2)
Total Protein: 6.7 g/dL (ref 6.5–8.1)

## 2016-05-10 LAB — CBC WITH DIFFERENTIAL/PLATELET
Basophils Absolute: 0 10*3/uL (ref 0.0–0.1)
Basophils Relative: 0 %
Eosinophils Absolute: 0.1 10*3/uL (ref 0.0–0.7)
Eosinophils Relative: 2 %
HEMATOCRIT: 36.5 % (ref 36.0–46.0)
HEMOGLOBIN: 12.8 g/dL (ref 12.0–15.0)
LYMPHS ABS: 1.2 10*3/uL (ref 0.7–4.0)
LYMPHS PCT: 20 %
MCH: 32.3 pg (ref 26.0–34.0)
MCHC: 35.1 g/dL (ref 30.0–36.0)
MCV: 92.2 fL (ref 78.0–100.0)
MONOS PCT: 7 %
Monocytes Absolute: 0.4 10*3/uL (ref 0.1–1.0)
NEUTROS ABS: 4.2 10*3/uL (ref 1.7–7.7)
NEUTROS PCT: 71 %
Platelets: 209 10*3/uL (ref 150–400)
RBC: 3.96 MIL/uL (ref 3.87–5.11)
RDW: 11.9 % (ref 11.5–15.5)
WBC: 6 10*3/uL (ref 4.0–10.5)

## 2016-05-10 LAB — URINE MICROSCOPIC-ADD ON

## 2016-05-10 LAB — I-STAT ARTERIAL BLOOD GAS, ED
ACID-BASE EXCESS: 4 mmol/L — AB (ref 0.0–2.0)
BICARBONATE: 25.5 mmol/L (ref 20.0–28.0)
O2 Saturation: 99 %
PH ART: 7.539 — AB (ref 7.350–7.450)
PO2 ART: 101 mmHg (ref 83.0–108.0)
TCO2: 26 mmol/L (ref 0–100)
pCO2 arterial: 29.7 mmHg — ABNORMAL LOW (ref 32.0–48.0)

## 2016-05-10 LAB — URINALYSIS, ROUTINE W REFLEX MICROSCOPIC
BILIRUBIN URINE: NEGATIVE
Glucose, UA: NEGATIVE mg/dL
KETONES UR: NEGATIVE mg/dL
NITRITE: NEGATIVE
PH: 7 (ref 5.0–8.0)
Protein, ur: NEGATIVE mg/dL
SPECIFIC GRAVITY, URINE: 1.01 (ref 1.005–1.030)

## 2016-05-10 LAB — BASIC METABOLIC PANEL
ANION GAP: 12 (ref 5–15)
BUN: 13 mg/dL (ref 6–20)
CO2: 26 mmol/L (ref 22–32)
Calcium: 9.6 mg/dL (ref 8.9–10.3)
Chloride: 84 mmol/L — ABNORMAL LOW (ref 101–111)
Creatinine, Ser: 1.03 mg/dL — ABNORMAL HIGH (ref 0.44–1.00)
GFR, EST NON AFRICAN AMERICAN: 54 mL/min — AB (ref >60)
GLUCOSE: 109 mg/dL — AB (ref 65–99)
POTASSIUM: 2.9 mmol/L — AB (ref 3.5–5.1)
Sodium: 122 mmol/L — ABNORMAL LOW (ref 135–145)

## 2016-05-10 LAB — I-STAT VENOUS BLOOD GAS, ED
Acid-Base Excess: 2 mmol/L (ref 0.0–2.0)
BICARBONATE: 27.5 mmol/L (ref 20.0–28.0)
O2 Saturation: 57 %
PCO2 VEN: 44.3 mmHg (ref 44.0–60.0)
PH VEN: 7.402 (ref 7.250–7.430)
PO2 VEN: 30 mmHg — AB (ref 32.0–45.0)
TCO2: 29 mmol/L (ref 0–100)

## 2016-05-10 LAB — NA AND K (SODIUM & POTASSIUM), RAND UR
Potassium Urine: 47 mmol/L
Sodium, Ur: 37 mmol/L

## 2016-05-10 LAB — OSMOLALITY: Osmolality: 253 mOsm/kg — ABNORMAL LOW (ref 275–295)

## 2016-05-10 LAB — MAGNESIUM: MAGNESIUM: 1.9 mg/dL (ref 1.7–2.4)

## 2016-05-10 LAB — I-STAT TROPONIN, ED: TROPONIN I, POC: 0 ng/mL (ref 0.00–0.08)

## 2016-05-10 LAB — OSMOLALITY, URINE: OSMOLALITY UR: 262 mosm/kg — AB (ref 300–900)

## 2016-05-10 LAB — CREATININE, URINE, RANDOM: Creatinine, Urine: 50.16 mg/dL

## 2016-05-10 LAB — PHOSPHORUS: PHOSPHORUS: 2.8 mg/dL (ref 2.5–4.6)

## 2016-05-10 LAB — ETHANOL: Alcohol, Ethyl (B): <5 mg/dL (ref <5)

## 2016-05-10 LAB — CBG MONITORING, ED: Glucose-Capillary: 121 mg/dL — ABNORMAL HIGH (ref 65–99)

## 2016-05-10 MED ORDER — LORAZEPAM 1 MG PO TABS
1.0000 mg | ORAL_TABLET | Freq: Four times a day (QID) | ORAL | Status: DC | PRN
  Administered 2016-05-11 – 2016-05-12 (×3): 1 mg via ORAL
  Filled 2016-05-10 (×3): qty 1

## 2016-05-10 MED ORDER — INFLUENZA VAC SPLIT QUAD 0.5 ML IM SUSY
0.5000 mL | PREFILLED_SYRINGE | INTRAMUSCULAR | Status: AC
  Administered 2016-05-13: 0.5 mL via INTRAMUSCULAR
  Filled 2016-05-10: qty 0.5

## 2016-05-10 MED ORDER — POTASSIUM CHLORIDE CRYS ER 20 MEQ PO TBCR
40.0000 meq | EXTENDED_RELEASE_TABLET | Freq: Once | ORAL | Status: AC
Start: 2016-05-10 — End: 2016-05-10
  Administered 2016-05-10: 40 meq via ORAL
  Filled 2016-05-10: qty 2

## 2016-05-10 MED ORDER — PANTOPRAZOLE SODIUM 40 MG PO TBEC
40.0000 mg | DELAYED_RELEASE_TABLET | Freq: Every day | ORAL | Status: DC
  Administered 2016-05-11 – 2016-05-13 (×3): 40 mg via ORAL
  Filled 2016-05-10 (×3): qty 1

## 2016-05-10 MED ORDER — THIAMINE HCL 100 MG/ML IJ SOLN: 100.0000 mg | Freq: Every day | INTRAMUSCULAR | Status: DC

## 2016-05-10 MED ORDER — SODIUM CHLORIDE 0.9 % IV SOLN
INTRAVENOUS | Status: DC
Start: 2016-05-10 — End: 2016-05-10

## 2016-05-10 MED ORDER — SODIUM CHLORIDE 0.9% FLUSH
3.0000 mL | Freq: Two times a day (BID) | INTRAVENOUS | Status: DC
  Administered 2016-05-10 – 2016-05-12 (×4): 3 mL via INTRAVENOUS

## 2016-05-10 MED ORDER — VITAMIN B-1 100 MG PO TABS
100.0000 mg | ORAL_TABLET | Freq: Every day | ORAL | Status: DC
  Administered 2016-05-10 – 2016-05-13 (×4): 100 mg via ORAL
  Filled 2016-05-10 (×4): qty 1

## 2016-05-10 MED ORDER — LOSARTAN POTASSIUM 50 MG PO TABS
50.0000 mg | ORAL_TABLET | Freq: Every day | ORAL | Status: DC
  Administered 2016-05-11 – 2016-05-13 (×3): 50 mg via ORAL
  Filled 2016-05-10 (×3): qty 1

## 2016-05-10 MED ORDER — LORAZEPAM 1 MG PO TABS: 0.0000 mg | ORAL_TABLET | Freq: Two times a day (BID) | ORAL | Status: DC

## 2016-05-10 MED ORDER — HYDRALAZINE HCL 20 MG/ML IJ SOLN
10.0000 mg | Freq: Four times a day (QID) | INTRAMUSCULAR | Status: DC | PRN
  Administered 2016-05-10: 10 mg via INTRAVENOUS
  Filled 2016-05-10: qty 1

## 2016-05-10 MED ORDER — ACETAMINOPHEN 650 MG RE SUPP: 650.0000 mg | Freq: Four times a day (QID) | RECTAL | Status: DC | PRN

## 2016-05-10 MED ORDER — SIMVASTATIN 20 MG PO TABS
20.0000 mg | ORAL_TABLET | Freq: Every evening | ORAL | Status: DC
  Administered 2016-05-10 – 2016-05-12 (×3): 20 mg via ORAL
  Filled 2016-05-10 (×3): qty 1

## 2016-05-10 MED ORDER — METOPROLOL SUCCINATE ER 50 MG PO TB24
50.0000 mg | ORAL_TABLET | Freq: Every day | ORAL | Status: DC
  Administered 2016-05-11 – 2016-05-13 (×3): 50 mg via ORAL
  Filled 2016-05-10 (×3): qty 1

## 2016-05-10 MED ORDER — FOLIC ACID 1 MG PO TABS
1.0000 mg | ORAL_TABLET | Freq: Every day | ORAL | Status: DC
  Administered 2016-05-10 – 2016-05-13 (×4): 1 mg via ORAL
  Filled 2016-05-10 (×4): qty 1

## 2016-05-10 MED ORDER — LOSARTAN POTASSIUM 25 MG PO TABS
25.0000 mg | ORAL_TABLET | Freq: Once | ORAL | Status: AC
  Administered 2016-05-10: 25 mg via ORAL
  Filled 2016-05-10: qty 1

## 2016-05-10 MED ORDER — LOSARTAN POTASSIUM 25 MG PO TABS: 25.0000 mg | ORAL_TABLET | Freq: Every day | ORAL | Status: DC

## 2016-05-10 MED ORDER — ROPINIROLE HCL 0.5 MG PO TABS: 0.5000 mg | ORAL_TABLET | Freq: Every day | ORAL | Status: DC

## 2016-05-10 MED ORDER — LORAZEPAM 2 MG/ML IJ SOLN: 1.0000 mg | Freq: Four times a day (QID) | INTRAMUSCULAR | Status: DC | PRN

## 2016-05-10 MED ORDER — ENOXAPARIN SODIUM 40 MG/0.4ML ~~LOC~~ SOLN
40.0000 mg | SUBCUTANEOUS | Status: DC
  Administered 2016-05-10 – 2016-05-11 (×2): 40 mg via SUBCUTANEOUS
  Filled 2016-05-10 (×2): qty 0.4

## 2016-05-10 MED ORDER — ACETAMINOPHEN 325 MG PO TABS: 650.0000 mg | ORAL_TABLET | Freq: Four times a day (QID) | ORAL | Status: DC | PRN

## 2016-05-10 MED ORDER — LORAZEPAM 1 MG PO TABS
1.0000 mg | ORAL_TABLET | Freq: Every day | ORAL | Status: DC
  Administered 2016-05-10 – 2016-05-13 (×4): 1 mg via ORAL
  Filled 2016-05-10 (×4): qty 1

## 2016-05-10 MED ORDER — LORAZEPAM 1 MG PO TABS: 0.0000 mg | ORAL_TABLET | Freq: Four times a day (QID) | ORAL | Status: DC

## 2016-05-10 MED ORDER — SODIUM CHLORIDE 0.9 % IV BOLUS (SEPSIS)
500.0000 mL | Freq: Once | INTRAVENOUS | Status: AC
  Administered 2016-05-10: 500 mL via INTRAVENOUS

## 2016-05-10 MED ORDER — ADULT MULTIVITAMIN W/MINERALS CH
1.0000 | ORAL_TABLET | Freq: Every day | ORAL | Status: DC
Start: 2016-05-10 — End: 2016-05-13
  Administered 2016-05-10 – 2016-05-13 (×4): 1 via ORAL
  Filled 2016-05-10 (×4): qty 1

## 2016-05-10 NOTE — Progress Notes (Signed)
Serum osmo LOW at 253 and pt very hypertensive and WAS NOT orthostatic so have dc'd IVFs (not yet started) and ordered a 1500cc/day FR. I have given an extra 25 mg of Cozaar now and increased her daily dose to 50 mg. I have also started prn hydralazine with parameters  Erin Hearing, ANP

## 2016-05-10 NOTE — ED Provider Notes (Signed)
[facility] DEPT Provider Note  CSN: [id number] Arrival Date & Time: 05/10/16 @ 3329  History    Chief Complaint Chief Complaint  Patient presents with  . Loss of Consciousness    HPI [name] is a 70 y.o. female. Patient presents today after being told to come to the ED by her physician on the 22nd of September. Patient had syncope event and passed out while playing slot machine on Sept 22nd. She had immediate return to baseline w/o symptoms of focal weakness or loss of sensation. No shaking spells.  Symptoms of lightheadedness have been present for several years. However syncope events started approximately 3 months ago patient endorses they were on a trip and while on the Bus she "passed out". Patient states she had symptoms of "knowing I was gonna pass out". States she had "tunnel vision" and endorsed generalized fatigue w/o SOB or CP. Did have feeling of heart racing.  No seizure disorders, CAD, or arrhythmias known. Patient had lung cancer diagnosed in right upper lobe that was treated w/ lung resection of RUL and 6 rounds of chemotherapy in November 01, 2013.  Denies GI Bleed. Physical is scheduled for first of November w/ Colonoscopy as she was due in 2013/11/01. Father passed away from massive MI at 73s.   Denies urinary symptoms now. No emesis.   Patient endorses 6 weeks ago her BP meds were switched from amlodipine to Losartan, Metoprolol and Chlorthalidone. Also started Ropinirole 2-3 months ago.  Stress testing in Nov 01, 2008 that was without concerns.  Patient had recent inner ear fluid on the right in PCP office approximately 3 months ago.  Past Medical & Surgical History    Past Medical History:  Diagnosis Date  . Arthritis    back   . Cavitating mass in right upper lung lobe   . CKD (chronic kidney disease) stage 3, GFR 30-59 ml/min 03/12/2015  . Diverticulosis   . Dizziness    occasionally   . Family history of kidney infection    pt history-saw Dr.Wrenn  . GERD  (gastroesophageal reflux disease)    takes Nexium daily  . History of colon polyps   . Hyperlipidemia    takes Zocor daily  . Hypertension    takes Amlodipine daily and Chlorthalidone as well  . Lung cancer, Right upper lobe   . Lung nodules    right upper lobe  . Nocturia    Patient Active Problem List   Diagnosis Date Noted  . CKD (chronic kidney disease) stage 3, GFR 30-59 ml/min 03/12/2015  . Cavitating mass in right upper lung lobe 08/21/2013  . Protein-calorie malnutrition, severe ([place]) 07/10/2013  . Cancer of upper lobe of lung ([place]) 07/09/2013  . S/P lobectomy of lung 07/08/2013  . Hyperlipidemia   . Hypertension   . Lung cancer, Right upper lobe    Past Surgical History:  Procedure Laterality Date  . ABDOMINAL HYSTERECTOMY     at age 40;partial   . CARDIOVASCULAR STRESS TEST  06/29/2009   EF 79%, NO ISCHEMIA  . COLONOSCOPY    . TRACHEOSTOMY     at age 62 or 5 d/t polyps on vocal cord  . VIDEO ASSISTED THORACOSCOPY (VATS)/WEDGE RESECTION Right 07/08/2013   Procedure: VIDEO ASSISTED THORACOSCOPY (VATS)/WEDGE RESECTION;  Surgeon: [name], MD;  Location: [facility];  Service: Thoracic;  Laterality: Right;  Marland Kitchen VIDEO BRONCHOSCOPY N/A 07/08/2013   Procedure: VIDEO BRONCHOSCOPY;  Surgeon: [name], MD;  Location: [facility];  Service: Thoracic;  Laterality: N/A;  Family & Social History    Family History  Problem Relation Age of Onset  . Heart attack Father   . Cancer Brother     stomach   Social History  Substance Use Topics  . Smoking status: Former Smoker    Types: Cigarettes    Quit date: 08/08/1978  . Smokeless tobacco: Never Used     Comment: quit 35 yrs ago  . Alcohol use Yes     Comment: several beers a day and shots of crown    Home Medications    Prior to Admission medications   Medication Sig Start Date End Date Taking? Authorizing Provider  chlorthalidone (HYGROTON) 25 MG tablet Take 25 mg by mouth daily.  07/23/15  Yes Historical  Provider, MD  esomeprazole (NEXIUM) 40 MG capsule Take 40 mg by mouth daily before breakfast.   Yes Historical Provider, MD  KLOR-CON 10 10 MEQ tablet Take 10 mEq by mouth daily. 06/25/15  Yes Historical Provider, MD  LORazepam (ATIVAN) 1 MG tablet Take 1 mg by mouth daily. 04/15/16  Yes Historical Provider, MD  losartan (COZAAR) 25 MG tablet Take 25 mg by mouth daily.   Yes Historical Provider, MD  metoprolol succinate (TOPROL-XL) 50 MG 24 hr tablet Take 50 mg by mouth daily. Take with or immediately following a meal.   Yes Historical Provider, MD  rOPINIRole (REQUIP) 0.25 MG tablet Take 0.5 mg by mouth at bedtime.  01/06/16  Yes Historical Provider, MD  simvastatin (ZOCOR) 20 MG tablet Take 20 mg by mouth every evening.   Yes Historical Provider, MD  methocarbamol (ROBAXIN) 500 MG tablet Take 1 tablet (500 mg total) by mouth 3 (three) times daily between meals as needed. Patient not taking: Reported on 05/10/2016 09/20/15   Tanna Furry, MD  oxyCODONE-acetaminophen (PERCOCET/ROXICET) 5-325 MG tablet Take 1-2 tablets by mouth every 4 (four) hours as needed. Patient not taking: Reported on 05/10/2016 09/20/15   Tanna Furry, MD    Allergies    Review of patient's allergies indicates no known allergies.  I reviewed & agree with nursing's documentation on the patient's past medical, surgical, social & family histories as well as their allergies.  Review of Systems  Complete ROS obtained, and is negative except as stated in HPI.  Physical Exam  Updated Vital Signs BP 166/74 (BP Location: Right Arm)   Pulse 66   Temp 97.9 F (36.6 C) (Oral)   Resp 14   SpO2 100%  I have reviewed the triage vital signs and the nursing notes. Physical Exam CONST: Patient alert, well appearing, in no apparent distress, well hydrated.  EYES: PERRLA. EOMI. Conjunctiva w/o d/c. Lids AT w/o swelling.  ENMT: External Nares & Ears AT w/o swelling. Oropharynx patent. MM moist.  NECK: ROM full w/o rigidity. Trachea  midline. JVD absent.  CVS: +S1/S2 w/o obvious murmur. Lower extremities w/o pitting edema.  RESP: Respiratory effort unlabored w/o retractions & accessory muscle use. BS clear bilaterally.  GI: Soft & ND. +BS x 4. TTP absent. Hernia absent. Guarding & Rebound absent.  BACK: CVA TTP absent bilaterally.  SKIN: Skin warm & dry. Turgor good. No rash.  PSYCH: Alert. Oriented. Affect and mood appropriate.  NEURO: CN II-XII grossly intact. Motor exam symmetric w/ upper & lower extremities 5/5 bilaterally. Sensation grossly intact. EOM reveals no vertical or multidirectional nystagmus. Without vertical dysconjugate gaze or deviation. Romberg negative. Rapid alternating symptoms normal. Heel to shin normal bilaterally. Finger to nose normal bilaterally.  MSK: Joints located &  stable, w/o obvious dislocation & obvious deformity or crepitus absent w/ Cap refill < 2 sec. Peripheral pulses 2+ & equal in all extremities.   ED Treatments & Results   Labs (only abnormal results are displayed) Labs Reviewed  COMPREHENSIVE METABOLIC PANEL - Abnormal; Notable for the following:       Result Value   Sodium 122 (*)    Potassium 3.1 (*)    Chloride 85 (*)    Glucose, Bld 110 (*)    Creatinine, Ser 1.07 (*)    GFR calc non Af Amer 51 (*)    GFR calc Af Amer 60 (*)    All other components within normal limits  CBG MONITORING, ED - Abnormal; Notable for the following:    Glucose-Capillary 121 (*)    All other components within normal limits  I-STAT ARTERIAL BLOOD GAS, ED - Abnormal; Notable for the following:    pH, Arterial 7.539 (*)    pCO2 arterial 29.7 (*)    Acid-Base Excess 4.0 (*)    All other components within normal limits  I-STAT VENOUS BLOOD GAS, ED - Abnormal; Notable for the following:    pO2, Ven 30.0 (*)    All other components within normal limits  I-STAT CHEM 8, ED - Abnormal; Notable for the following:    Sodium 121 (*)    Potassium 3.0 (*)    Chloride 83 (*)    Creatinine, Ser 1.10  (*)    Glucose, Bld 110 (*)    Calcium, Ion 1.13 (*)    All other components within normal limits  URINE CULTURE  CBC WITH DIFFERENTIAL/PLATELET  URINALYSIS, ROUTINE W REFLEX MICROSCOPIC (NOT AT [facility])  BASIC METABOLIC PANEL  OSMOLALITY  CREATININE, URINE, RANDOM  NA AND K (SODIUM & POTASSIUM), RAND UR  OSMOLALITY, URINE  CREATININE, SERUM  I-STAT TROPOININ, ED    EKG   EKG Interpretation  Date/Time:  Tuesday May 10 2016 12:48:52 EDT Ventricular Rate:  67 PR Interval:  188 QRS Duration: 78 QT Interval:  406 QTC Calculation: 429 R Axis:   35 Text Interpretation:  Normal sinus rhythm Normal ECG No significant change since last tracing Confirmed by KNOTT MD, DANIEL [phone]) on 05/10/2016 1:58:58 PM       Radiology Dg Chest 2 View  Result Date: 05/10/2016 CLINICAL DATA:  Recurrent syncopal episodes over the past 5-6 months. History of prior right upper lobectomy. EXAM: CHEST  2 VIEW COMPARISON:  CT chest 03/15/2016.  PA and lateral chest 11/28/2013. FINDINGS: Volume loss in the right chest and linear scarring in the right base consistent with history of lobectomy are again seen. The lungs are otherwise clear. Heart size is normal. No pneumothorax or pleural effusion. No focal bony abnormality. IMPRESSION: No acute disease. Postoperative change right chest is stable in appearance. Electronically Signed   By: Inge Rise M.D.   On: 05/10/2016 14:03    Pertinent labs & imaging results that were available during my care of the patient were independently visualized by me and considered in my medical decision making, please see chart for details.  Procedures (including critical care time) Procedures  Medications Ordered in ED Medications  sodium chloride 0.9 % bolus 500 mL (not administered)    Initial Impression & Plan / ED Course & Results / Final Disposition   Initial Impression & Plan Patient presents emergency department for assessment of intermittent  lightheadedness and episodes of recurrent syncope been present over the past 3 months. Patient endorses no chest pain  no shortness of breath and no history of coronary artery disease or previous arrhythmia. Patient does endorse that her primary care physician recently switched her blood pressure medications from amlodipine to chlorthalidone losartan and metoprolol. Patient also endorses that she had ropinirole started for restless leg at approximately the same time. Patient denies orthostatic symptoms and events do not concern nor they consistent with seizure-like activity as patient has immediate return to baseline without concerns for post ictal event. Patient endorses one episode of symptoms consistent with vertigo however other episodes are not consistent with vertigo. Patient has had no previous stroke has no coronary artery disease does not smoke however has hypertension and hyperlipidemia hyperlipidemia but no diabetes.  Complete neurologic exam performed along with testing of cerebellar function and there is no concern for neurologic deficit. Do not have concern for CVA or TIA this time and symptoms and exam are not consistent with central causes of vertigo such as vertebral basilar insufficiency stroke or dissection. Patient denies headache and no headaches associated with syncopal events therefore do not believe subarachnoid is likely. Patient has not had gastrointestinal bleed and no blood per stool or rectum. Patient had stress test in November 2010 showed normal EF and no ischemic concerns.  ED Course & Results Per exam and history of present illness CT of head ordered to evaluate for mass effect or other mass lesion of brainstem that would cause syncopal events or hyponatremia, however will not obtain CTA or MRA as patient has no posterior circulation symptoms.   CXR unremarkable for acute cardiac disease. ECG reveals no advance heart block no acute STEMI no ischemic ST segment depression or  T-wave inversion that is anatomical. No preexcitation evidence of bronchitis or long QT syndrome.  ABG reveals mild alkalosis however no hypoxia or hypercapnia. CBC unremarkable without leukocytosis or anemia. Upon my review patient's CMP reveals significant hyponatremia at 122 potassium low at 3.1 and hypochloremia at 85. Creatinine mildly elevated at 1.07 however upon comparison to prior creatinine on 03/15/2016 Creatinine was 1.1 without hypo-kalemia and sodium 134 therefore I obtained repeat Metabolic labs and upon review they reveal hyponatremia is consistent.  Final Disposition Reassessment of the patient reveals no concerns for AMS or decline in LOC. They remain HD stable w/o seizure activity. Per my clinical impression at the end of my shift, patient still requires CT head, therefore care assumed by Dr. Jimmye Norman. Please refer to their documentation regarding continued ED course along with final impression and disposition.  Final Clinical Impression & ED Diagnoses   1. Syncope and collapse   2. Hypokalemia   3. Hyponatremia    Patient care discussed with the attending physician, Dr. Laneta Simmers, who oversaw their evaluation & treatment & voiced agreement.  Note: This document was prepared using Dragon voice recognition software and may include unintentional dictation errors.  House Officer: Voncille Lo, MD, Emergency Medicine Resident.   Voncille Lo, MD 05/10/16 1502    Leo Grosser, MD 05/10/16 [id number]

## 2016-05-10 NOTE — H&P (Signed)
History and Physical    [name] EUM:[id number] DOB: May 26, 1946 DOA: 05/10/2016   PCP: [name], MD   Patient coming from/Resides with: Private residence/lives with spouse  Admission status: Observation/telemetry  Chief Complaint: Recurrent syncopal episodes  HPI: [name] is a 70 y.o. female with medical history significant for hypertension recently difficult to control, non-small cell lung carcinoma status post VATS procedure and chemotherapy completed in 2015, dyslipidemia and restless leg syndrome. Patient reports a history of recurrent syncope or near syncopal episodes over the past 6 months. 3 definitive episodes including today's. The first 2 occurred while the patient was sitting there was no prodrome patient completely "blacked out" and after laying supine for 15 minutes returned to baseline. At home patient will begin to feel weak and therefore Lasix down for a few minutes and the episode passes. She has never had a syncopal episode at the house. Because of her symptoms she went to the doctor today and he referred her to the ER for additional evaluation. Of note patient has had several adjustments in her antihypertensive medications in the past 6 weeks; because of lower extremity/ankle edema Norvasc was discontinued in favor of Toprol and Cozaar. She has been on chlorthalidone for many years.  ED Course:  Vital Signs: BP 166/74 (BP Location: Right Arm)   Pulse 66   Temp 97.9 F (36.6 C) (Oral)   Resp 14   SpO2 100%  Two-view chest x-ray: No acute disease CT head without contrast: Diffuse atrophy without any acute changes Lab data: Sodium 121, potassium 3.0, chloride 83, BUN 15, creatinine 1.10, glucose 110, anion gap 12, WBC 6000 with normal differential, hemoglobin 12.8, platelets 209,000, urinalysis unremarkable, urine potassium 47, urine sodium 37, urine creatinine 50, urine and serum osmolality ordered by EDP and pending Medications and treatments:  Normal saline bolus 500 mL 1  Review of Systems:  In addition to the HPI above,  No Fever-chills, myalgias or other constitutional symptoms No Headache, changes with Vision or hearing, new focal weakness, tingling, numbness in any extremity, dizziness, dysarthria or word finding difficulty, gait disturbance or imbalance, tremors or seizure activity No problems swallowing food or Liquids, indigestion/reflux, choking or coughing while eating, abdominal pain with or after eating No Chest pain, Cough or Shortness of Breath, palpitations, orthopnea or DOE No Abdominal pain, N/V, melena,hematochezia, dark tarry stools No dysuria, malodorous urine, hematuria or flank pain No new skin rashes, lesions, masses or bruises, No new joint pains, aches, swelling or redness No recent unintentional weight gain or loss No polyuria, polydypsia or polyphagia   Past Medical History:  Diagnosis Date  . Arthritis    back   . Cavitating mass in right upper lung lobe   . CKD (chronic kidney disease) stage 3, GFR 30-59 ml/min 03/12/2015  . Diverticulosis   . Dizziness    occasionally   . Family history of kidney infection    pt history-saw Dr.Wrenn  . GERD (gastroesophageal reflux disease)    takes Nexium daily  . History of colon polyps   . Hyperlipidemia    takes Zocor daily  . Hypertension    takes Amlodipine daily and Chlorthalidone as well  . Lung cancer, Right upper lobe   . Lung nodules    right upper lobe  . Nocturia     Past Surgical History:  Procedure Laterality Date  . ABDOMINAL HYSTERECTOMY     at age 39;partial   . CARDIOVASCULAR STRESS TEST  06/29/2009   EF  79%, NO ISCHEMIA  . COLONOSCOPY    . TRACHEOSTOMY     at age 68 or 5 d/t polyps on vocal cord  . VIDEO ASSISTED THORACOSCOPY (VATS)/WEDGE RESECTION Right 07/08/2013   Procedure: VIDEO ASSISTED THORACOSCOPY (VATS)/WEDGE RESECTION;  Surgeon: [name], MD;  Location: [place];  Service: Thoracic;  Laterality: Right;  Marland Kitchen  VIDEO BRONCHOSCOPY N/A 07/08/2013   Procedure: VIDEO BRONCHOSCOPY;  Surgeon: [name], MD;  Location: [facility] OR;  Service: Thoracic;  Laterality: N/A;    Social History   Social History  . Marital status: Married    Spouse name: N/A  . Number of children: N/A  . Years of education: N/A   Occupational History  . Not on file.   Social History Main Topics  . Smoking status: Former Smoker    Types: Cigarettes    Quit date: 08/08/1978  . Smokeless tobacco: Never Used     Comment: quit 35 yrs ago  . Alcohol use Yes     Comment: several beers a day and shots of crown  . Drug use: No  . Sexual activity: Not Currently    Birth control/ protection: Surgical   Other Topics Concern  . Not on file   Social History Narrative  . No narrative on file    Mobility: Without assistive devices Work history: Retired   No Known Allergies  Family History  Problem Relation Age of Onset  . Heart attack Father   . Cancer Brother     stomach     Prior to Admission medications   Medication Sig Start Date End Date Taking? Authorizing Provider  chlorthalidone (HYGROTON) 25 MG tablet Take 25 mg by mouth daily.  07/23/15  Yes Historical Provider, MD  esomeprazole (NEXIUM) 40 MG capsule Take 40 mg by mouth daily before breakfast.   Yes Historical Provider, MD  KLOR-CON 10 10 MEQ tablet Take 10 mEq by mouth daily. 06/25/15  Yes Historical Provider, MD  LORazepam (ATIVAN) 1 MG tablet Take 1 mg by mouth daily. 04/15/16  Yes Historical Provider, MD  losartan (COZAAR) 25 MG tablet Take 25 mg by mouth daily.   Yes Historical Provider, MD  metoprolol succinate (TOPROL-XL) 50 MG 24 hr tablet Take 50 mg by mouth daily. Take with or immediately following a meal.   Yes Historical Provider, MD  rOPINIRole (REQUIP) 0.25 MG tablet Take 0.5 mg by mouth at bedtime.  01/06/16  Yes Historical Provider, MD  simvastatin (ZOCOR) 20 MG tablet Take 20 mg by mouth every evening.   Yes Historical Provider, MD    methocarbamol (ROBAXIN) 500 MG tablet Take 1 tablet (500 mg total) by mouth 3 (three) times daily between meals as needed. Patient not taking: Reported on 05/10/2016 09/20/15   Tanna Furry, MD  oxyCODONE-acetaminophen (PERCOCET/ROXICET) 5-325 MG tablet Take 1-2 tablets by mouth every 4 (four) hours as needed. Patient not taking: Reported on 05/10/2016 09/20/15   Tanna Furry, MD    Physical Exam: Vitals:   05/10/16 1249 05/10/16 1250  BP: 166/74 166/74  Pulse: 67 66  Resp: 18 14  Temp: 97.9 F (36.6 C) 97.9 F (36.6 C)  TempSrc: Oral Oral  SpO2: 100% 100%      Constitutional: NAD, calm, comfortable Eyes: PERRL, lids and conjunctivae normal ENMT: Mucous membranes are dry. Posterior pharynx clear of any exudate or lesions.Normal dentition.  Neck: normal, supple, no masses, no thyromegaly Respiratory: clear to auscultation bilaterally, no wheezing, no crackles. Normal respiratory effort. No accessory muscle use.  Cardiovascular: Regular rate and rhythm, no murmurs / rubs / gallops. No extremity edema. 2+ pedal pulses. No carotid bruits.  Abdomen: no tenderness, no masses palpated. No hepatosplenomegaly. Bowel sounds positive.  Musculoskeletal: no clubbing / cyanosis. No joint deformity upper and lower extremities. Good ROM, no contractures. Normal muscle tone.  Skin: no rashes, lesions, ulcers. No induration Neurologic: CN 2-12 grossly intact. Sensation intact, DTR normal. Strength 5/5 x all 4 extremities.  Psychiatric: Normal judgment and insight. Alert and oriented x 3. Normal mood.    Labs on Admission: I have personally reviewed following labs and imaging studies  CBC:  Recent Labs Lab 05/10/16 1330 05/10/16 1439  WBC 6.0  --   NEUTROABS 4.2  --   HGB 12.8 12.9  HCT 36.5 38.0  MCV 92.2  --   PLT 209  --    Basic Metabolic Panel:  Recent Labs Lab 05/10/16 1330 05/10/16 1430 05/10/16 1439  NA 122* 122* 121*  K 3.1* 2.9* 3.0*  CL 85* 84* 83*  CO2 27 26  --    GLUCOSE 110* 109* 110*  BUN '14 13 15  '$ CREATININE 1.07* 1.03* 1.10*  CALCIUM 9.6 9.6  --    GFR: CrCl cannot be calculated (Unknown ideal weight.). Liver Function Tests:  Recent Labs Lab 05/10/16 1330  AST 28  ALT 21  ALKPHOS 82  BILITOT 0.7  PROT 6.7  ALBUMIN 4.3   No results for input(s): LIPASE, AMYLASE in the last 168 hours. No results for input(s): AMMONIA in the last 168 hours. Coagulation Profile: No results for input(s): INR, PROTIME in the last 168 hours. Cardiac Enzymes: No results for input(s): CKTOTAL, CKMB, CKMBINDEX, TROPONINI in the last 168 hours. BNP (last 3 results) No results for input(s): PROBNP in the last 8760 hours. HbA1C: No results for input(s): HGBA1C in the last 72 hours. CBG:  Recent Labs Lab 05/10/16 1253  GLUCAP 121*   Lipid Profile: No results for input(s): CHOL, HDL, LDLCALC, TRIG, CHOLHDL, LDLDIRECT in the last 72 hours. Thyroid Function Tests: No results for input(s): TSH, T4TOTAL, FREET4, T3FREE, THYROIDAB in the last 72 hours. Anemia Panel: No results for input(s): VITAMINB12, FOLATE, FERRITIN, TIBC, IRON, RETICCTPCT in the last 72 hours. Urine analysis:    Component Value Date/Time   COLORURINE YELLOW 05/10/2016 [place] 05/10/2016 1509   LABSPEC 1.010 05/10/2016 1509   PHURINE 7.0 05/10/2016 1509   GLUCOSEU NEGATIVE 05/10/2016 1509   HGBUR TRACE (A) 05/10/2016 1509   BILIRUBINUR NEGATIVE 05/10/2016 1509   KETONESUR NEGATIVE 05/10/2016 1509   PROTEINUR NEGATIVE 05/10/2016 1509   UROBILINOGEN 0.2 07/03/2013 0839   NITRITE NEGATIVE 05/10/2016 1509   LEUKOCYTESUR SMALL (A) 05/10/2016 1509   Sepsis Labs: '@LABRCNTIP'$ (procalcitonin:4,lacticidven:4) )No results found for this or any previous visit (from the past 240 hour(s)).   Radiological Exams on Admission: Dg Chest 2 View  Result Date: 05/10/2016 CLINICAL DATA:  Recurrent syncopal episodes over the past 5-6 months. History of prior right upper  lobectomy. EXAM: CHEST  2 VIEW COMPARISON:  CT chest 03/15/2016.  PA and lateral chest 11/28/2013. FINDINGS: Volume loss in the right chest and linear scarring in the right base consistent with history of lobectomy are again seen. The lungs are otherwise clear. Heart size is normal. No pneumothorax or pleural effusion. No focal bony abnormality. IMPRESSION: No acute disease. Postoperative change right chest is stable in appearance. Electronically Signed   By: Inge Rise M.D.   On: 05/10/2016 14:03   Ct Head  Wo Contrast  Result Date: 05/10/2016 CLINICAL DATA:  Dizziness. Syncopal episodes with transient loss of consciousness. History of lung carcinoma. EXAM: CT HEAD WITHOUT CONTRAST TECHNIQUE: Contiguous axial images were obtained from the base of the skull through the vertex without intravenous contrast. COMPARISON:  June 13, 2013 FINDINGS: Brain: There is mild diffuse atrophy. There is no intracranial mass hemorrhage, extra-axial fluid collection, or midline shift. There is mild small vessel disease in the centra semiovale bilaterally. No acute infarct is evident. Vascular: No hyperdense vessel evident. There is calcification in each carotid siphon region. Skull: The bony calvarium appears intact. No blastic or lytic bone lesions are evident. Sinuses/Orbits: Visualized paranasal sinuses are clear. Orbits appear symmetric bilaterally. Other: Mastoid air cells are clear. IMPRESSION: Atrophy with periventricular small vessel disease, stable. No acute infarct evident. No mass, hemorrhage, or extra-axial fluid collection. Vascular calcification in each carotid siphon region noted. Electronically Signed   By: Lowella Grip III M.D.   On: 05/10/2016 15:12    EKG: (Independently reviewed) sinus rhythm with ventricular rate 67 bpm, QTC 429 ms, no ischemic changes  Assessment/Plan Principal Problem:   Syncope -Recurrent in nature without focal neurological deficits, preceding chest pain shortness of  breath or palpitations; no continence of bowel or bladder therefore suspect orthostatic etiology given history of chronic use of diuretic and use of Requip -Check orthostatic vital signs; ordered by EDP but unfortunately patient was given 500 mL saline bolus prior to obtaining orthostatic vital signs -PH on ABG 7.54 consistent with contraction alkalosis -Echocardiogram -Hold offending medications: Chlorthalidone and Requip  Active Problems:   Acute hyponatremia -New problem -Differential includes secondary to thiazide diuretic versus secondary to regular alcohol usage (beer and liquor) -Less likely secondary to bronchogenic carcinoma noting patient had normal surveillance CT of the chest August 2017 -Agree with SIADH workup-need to calculate FeNa -Given uncontrolled hypertension with associated hypokalemia and hyponatremia may need to explore other causes such as Liddles syndrome, Bartter or Gittelman syndrome -Follow labs and avoid rapid correction -Check urine drug screen and serum alcohol level    CKD (chronic kidney disease) stage 3, GFR 30-59 ml/min -Renal function stable and at baseline of 15/1.10    Uncontrolled hypertension -Has been on chlorthalidone from several years -In the past few months Norvasc has been discontinued in favor of Cozaar and Toprol -There is room to increase Cozaar dosing if needed -See above regarding associated acute hyponatremia and hypokalemia    Acute hypokalemia -Likely from thiazide diuretic -Replace -As above    Hyperlipidemia -Continue Zocor    Lung cancer, Right upper lobe/S/P lobectomy of lung -Surgically treated and completed chemotherapy in 2015 -Followed by Dr. Julien Nordmann with oncology -Last surveillance CT of the chest in August 2017 without evidence of recurrence    Restless legs syndrome -On Requip prior to admission -This can cause orthostasis so have placed on hold for now      DVT prophylaxis: Lovenox Code Status:  Full Family Communication: Husband at bedside Disposition Plan: Anticipate discharge back to preadmission home environment once medically stable Consults called: None     ELLIS,ALLISON L. ANP-BC [facility] Pager [phone]   If 7PM-7AM, please contact night-coverage [email or web address] Password [email or web address]  05/10/2016, 3:52 PM

## 2016-05-10 NOTE — ED Notes (Signed)
Pt given food and beverage per RN. 

## 2016-05-10 NOTE — ED Notes (Signed)
Patient states she say her doctor last week for syncopal episodes (3 in the last 6 months).   Patient states last episode last week and symptoms have resolved.   Her doctor told her to come to ED last week for CT and MRI.

## 2016-05-10 NOTE — ED Triage Notes (Signed)
Pt reports dizzy spells for years but not she synopsizes and out for minutes at a time. Gets diaphoretic when it occurs. Not diabetic. No cardiologist.

## 2016-05-11 ENCOUNTER — Other Ambulatory Visit (HOSPITAL_COMMUNITY): Payer: Medicare Other

## 2016-05-11 DIAGNOSIS — Z90711 Acquired absence of uterus with remaining cervical stump: Secondary | ICD-10-CM | POA: Diagnosis not present

## 2016-05-11 DIAGNOSIS — R55 Syncope and collapse: Secondary | ICD-10-CM

## 2016-05-11 DIAGNOSIS — N183 Chronic kidney disease, stage 3 (moderate): Secondary | ICD-10-CM | POA: Diagnosis present

## 2016-05-11 DIAGNOSIS — E871 Hypo-osmolality and hyponatremia: Secondary | ICD-10-CM | POA: Diagnosis present

## 2016-05-11 DIAGNOSIS — I10 Essential (primary) hypertension: Secondary | ICD-10-CM

## 2016-05-11 DIAGNOSIS — G2581 Restless legs syndrome: Secondary | ICD-10-CM | POA: Diagnosis present

## 2016-05-11 DIAGNOSIS — E876 Hypokalemia: Secondary | ICD-10-CM | POA: Diagnosis present

## 2016-05-11 DIAGNOSIS — Z23 Encounter for immunization: Secondary | ICD-10-CM | POA: Diagnosis not present

## 2016-05-11 DIAGNOSIS — Z87891 Personal history of nicotine dependence: Secondary | ICD-10-CM | POA: Diagnosis not present

## 2016-05-11 DIAGNOSIS — Z85118 Personal history of other malignant neoplasm of bronchus and lung: Secondary | ICD-10-CM | POA: Diagnosis not present

## 2016-05-11 DIAGNOSIS — E785 Hyperlipidemia, unspecified: Secondary | ICD-10-CM | POA: Diagnosis present

## 2016-05-11 DIAGNOSIS — Z9221 Personal history of antineoplastic chemotherapy: Secondary | ICD-10-CM | POA: Diagnosis not present

## 2016-05-11 DIAGNOSIS — Z902 Acquired absence of lung [part of]: Secondary | ICD-10-CM | POA: Diagnosis not present

## 2016-05-11 DIAGNOSIS — T502X5A Adverse effect of carbonic-anhydrase inhibitors, benzothiadiazides and other diuretics, initial encounter: Secondary | ICD-10-CM | POA: Diagnosis present

## 2016-05-11 DIAGNOSIS — E873 Alkalosis: Secondary | ICD-10-CM | POA: Diagnosis present

## 2016-05-11 DIAGNOSIS — I129 Hypertensive chronic kidney disease with stage 1 through stage 4 chronic kidney disease, or unspecified chronic kidney disease: Secondary | ICD-10-CM | POA: Diagnosis present

## 2016-05-11 DIAGNOSIS — K219 Gastro-esophageal reflux disease without esophagitis: Secondary | ICD-10-CM | POA: Diagnosis present

## 2016-05-11 DIAGNOSIS — I6523 Occlusion and stenosis of bilateral carotid arteries: Secondary | ICD-10-CM | POA: Diagnosis present

## 2016-05-11 DIAGNOSIS — Z79899 Other long term (current) drug therapy: Secondary | ICD-10-CM | POA: Diagnosis not present

## 2016-05-11 DIAGNOSIS — Z8249 Family history of ischemic heart disease and other diseases of the circulatory system: Secondary | ICD-10-CM | POA: Diagnosis not present

## 2016-05-11 DIAGNOSIS — Z808 Family history of malignant neoplasm of other organs or systems: Secondary | ICD-10-CM | POA: Diagnosis not present

## 2016-05-11 DIAGNOSIS — E784 Other hyperlipidemia: Secondary | ICD-10-CM

## 2016-05-11 LAB — COMPREHENSIVE METABOLIC PANEL
ALT: 18 U/L (ref 14–54)
AST: 24 U/L (ref 15–41)
Albumin: 3.9 g/dL (ref 3.5–5.0)
Alkaline Phosphatase: 69 U/L (ref 38–126)
Anion gap: 10 (ref 5–15)
BUN: 11 mg/dL (ref 6–20)
CHLORIDE: 89 mmol/L — AB (ref 101–111)
CO2: 28 mmol/L (ref 22–32)
CREATININE: 0.99 mg/dL (ref 0.44–1.00)
Calcium: 9.6 mg/dL (ref 8.9–10.3)
GFR, EST NON AFRICAN AMERICAN: 56 mL/min — AB (ref >60)
Glucose, Bld: 108 mg/dL — ABNORMAL HIGH (ref 65–99)
POTASSIUM: 2.9 mmol/L — AB (ref 3.5–5.1)
Sodium: 127 mmol/L — ABNORMAL LOW (ref 135–145)
TOTAL PROTEIN: 6.2 g/dL — AB (ref 6.5–8.1)
Total Bilirubin: 0.5 mg/dL (ref 0.3–1.2)

## 2016-05-11 LAB — URINE CULTURE

## 2016-05-11 LAB — RAPID URINE DRUG SCREEN, HOSP PERFORMED
Amphetamines: NOT DETECTED
BARBITURATES: NOT DETECTED
BENZODIAZEPINES: NOT DETECTED
COCAINE: NOT DETECTED
Opiates: NOT DETECTED
TETRAHYDROCANNABINOL: NOT DETECTED

## 2016-05-11 LAB — GLUCOSE, CAPILLARY: GLUCOSE-CAPILLARY: 106 mg/dL — AB (ref 65–99)

## 2016-05-11 MED ORDER — ROPINIROLE HCL 1 MG PO TABS
0.5000 mg | ORAL_TABLET | Freq: Every day | ORAL | Status: DC
  Administered 2016-05-11 – 2016-05-12 (×2): 0.5 mg via ORAL
  Filled 2016-05-11 (×2): qty 1

## 2016-05-11 MED ORDER — POTASSIUM CHLORIDE CRYS ER 20 MEQ PO TBCR
40.0000 meq | EXTENDED_RELEASE_TABLET | ORAL | Status: AC
  Administered 2016-05-11 (×2): 40 meq via ORAL
  Filled 2016-05-11 (×2): qty 2

## 2016-05-11 NOTE — Care Management Obs Status (Signed)
[facility] NOTIFICATION   Patient Details  Name: [name] MRN: [id number] Date of Birth: 1946/06/11   Medicare Observation Status Notification Given:  Yes    Dawayne Patricia, RN 05/11/2016, 4:24 PM

## 2016-05-11 NOTE — Progress Notes (Addendum)
PROGRESS NOTE  [name]  YBO:[id number] DOB: 12/22/1945  DOA: 05/10/2016 PCP: [name], MD   Brief Narrative:  70 year old female patient with history of stage III chronic kidney disease, chronic intermittent dizziness/? BPPV, GERD, HLD, HTN-apparent recent medication changes in the last 2 weeks, NSCLC status post wets procedure and chemotherapy completed 2015, RLS, alcohol use/? Abuse, presented to [facility] on 05/10/16 with a reported 2 episodes of syncope (first occurred approximately 3 weeks ago on a bus trip with colleagues to [facility] and the second one approximately 2 weeks ago while she was at [facility]) and both occurred when she was sitting up for sometime, felt lightheaded and faint prior to passing out. She saw her PCP after the second episode and was advised to come to the ED but did not until day of this admission. In the ED, CT head without acute findings, serum sodium 121, potassium 3, chloride 83.   Assessment & Plan:   Principal Problem:   Syncope Active Problems:   Hyperlipidemia   Lung cancer, Right upper lobe   S/P lobectomy of lung   CKD (chronic kidney disease) stage 3, GFR 30-59 ml/min   Acute hyponatremia   Uncontrolled hypertension   Acute hypokalemia   Restless legs syndrome   Hyponatremia   Syncope, recurrent - May be related to orthostatic changes and hyponatremia. - Telemetry shows sinus rhythm without abnormalities. - Follow 2-D echo. Negative orthostatic changes on 05/10/16. - Address hyponatremia as below.  Hyponatremia - Most likely secondary to HCTZ and beer use. Although serum and urinary osmolarity is low, patient denies excess free water intake. Thereby psychogenic polydipsia may be less likely. Placed on fluid restriction. Sodium has improved from 121 > 127. DC HCTZ indefinitely. Follow BMP in a.m. No mental status changes related to this.  Stage III chronic kidney disease Stable.  Uncontrolled hypertension -  As per report,  multiple antihypertensive medications changes recently. Amlodipine had to be discontinued secondary to ankle edema. - Continue losartan and metoprolol and adjust doses as needed.  Hypokalemia - Secondary to HCTZ. Replace aggressively and follow. BMP in a.m. Magnesium 1.9.  Hyperlipidemia - Statins.  NSCLC - Outpatient follow-up with oncology.  Restless leg syndrome - Resume Requip which she has been on for several years and is quite symptomatic at night.  Alcohol use/? Abuse Moderation/cessation counseled. CIWA    DVT prophylaxis: Lovenox Code Status: Full Family Communication: None at bedside Disposition Plan: DC home possibly in the next 24-48 hours.   Consultants:   None   Procedures:   None   Antimicrobials:   None   Subjective: Feels better. Complaints of symptoms related to her restless leg syndrome and would like her medication resumed. No dizziness, lightheadedness, chest pain or palpitations. Reiterates that her last episode of passing out was approximately 2 weeks ago.   Objective:  Vitals:   05/11/16 0042 05/11/16 0540 05/11/16 0947 05/11/16 1425  BP: 124/62 140/66 117/70 (!) 161/67  Pulse: 62 (!) 57 64 64  Resp: '18 16  18  '$ Temp: 97.9 F (36.6 C) 97.9 F (36.6 C) 98 F (36.7 C) 97.5 F (36.4 C)  TempSrc: Oral Oral Oral Oral  SpO2: 99% 99% 100% 100%  Weight:  64.5 kg (142 lb 3.2 oz)     No intake or output data in the 24 hours ending 05/11/16 1557 Filed Weights   05/10/16 1703 05/11/16 0540  Weight: 66.8 kg (147 lb 4.3 oz) 64.5 kg (142 lb 3.2 oz)  Examination:  General exam: Pleasant elderly female sitting up comfortably in bed. Respiratory system: Clear to auscultation. Respiratory effort normal. Cardiovascular system: S1 & S2 heard, RRR. No JVD, murmurs, rubs, gallops or clicks. No pedal edema. Telemetry: Sinus rhythm.  Gastrointestinal system: Abdomen is nondistended, soft and nontender. No organomegaly or masses felt. Normal bowel  sounds heard. Central nervous system: Alert and oriented. No focal neurological deficits. Extremities: Symmetric 5 x 5 power. Skin: No rashes, lesions or ulcers Psychiatry: Judgement and insight appear normal. Mood & affect appropriate.     Data Reviewed: I have personally reviewed following labs and imaging studies  CBC:  Recent Labs Lab 05/10/16 1330 05/10/16 1439  WBC 6.0  --   NEUTROABS 4.2  --   HGB 12.8 12.9  HCT 36.5 38.0  MCV 92.2  --   PLT 209  --    Basic Metabolic Panel:  Recent Labs Lab 05/10/16 1330 05/10/16 1430 05/10/16 1439 05/10/16 1846 05/11/16 0248  NA 122* 122* 121*  --  127*  K 3.1* 2.9* 3.0*  --  2.9*  CL 85* 84* 83*  --  89*  CO2 27 26  --   --  28  GLUCOSE 110* 109* 110*  --  108*  BUN '14 13 15  '$ --  11  CREATININE 1.07* 1.03* 1.10*  --  0.99  CALCIUM 9.6 9.6  --   --  9.6  MG  --   --   --  1.9  --   PHOS  --   --   --  2.8  --    GFR: Estimated Creatinine Clearance: 45.7 mL/min (by C-G formula based on SCr of 0.99 mg/dL). Liver Function Tests:  Recent Labs Lab 05/10/16 1330 05/11/16 0248  AST 28 24  ALT 21 18  ALKPHOS 82 69  BILITOT 0.7 0.5  PROT 6.7 6.2*  ALBUMIN 4.3 3.9   No results for input(s): LIPASE, AMYLASE in the last 168 hours. No results for input(s): AMMONIA in the last 168 hours. Coagulation Profile: No results for input(s): INR, PROTIME in the last 168 hours. Cardiac Enzymes: No results for input(s): CKTOTAL, CKMB, CKMBINDEX, TROPONINI in the last 168 hours. BNP (last 3 results) No results for input(s): PROBNP in the last 8760 hours. HbA1C: No results for input(s): HGBA1C in the last 72 hours. CBG:  Recent Labs Lab 05/10/16 1253 05/11/16 0545  GLUCAP 121* 106*   Lipid Profile: No results for input(s): CHOL, HDL, LDLCALC, TRIG, CHOLHDL, LDLDIRECT in the last 72 hours. Thyroid Function Tests: No results for input(s): TSH, T4TOTAL, FREET4, T3FREE, THYROIDAB in the last 72 hours. Anemia Panel: No  results for input(s): VITAMINB12, FOLATE, FERRITIN, TIBC, IRON, RETICCTPCT in the last 72 hours.  Sepsis Labs: No results for input(s): PROCALCITON, LATICACIDVEN in the last 168 hours.  Recent Results (from the past 240 hour(s))  Urine culture     Status: Abnormal   Collection Time: 05/10/16  3:09 PM  Result Value Ref Range Status   Specimen Description URINE, CLEAN CATCH  Final   Special Requests NONE  Final   Culture MULTIPLE SPECIES PRESENT, SUGGEST RECOLLECTION (A)  Final   Report Status 05/11/2016 FINAL  Final         Radiology Studies: Dg Chest 2 View  Result Date: 05/10/2016 CLINICAL DATA:  Recurrent syncopal episodes over the past 5-6 months. History of prior right upper lobectomy. EXAM: CHEST  2 VIEW COMPARISON:  CT chest 03/15/2016.  PA and lateral chest 11/28/2013. FINDINGS:  Volume loss in the right chest and linear scarring in the right base consistent with history of lobectomy are again seen. The lungs are otherwise clear. Heart size is normal. No pneumothorax or pleural effusion. No focal bony abnormality. IMPRESSION: No acute disease. Postoperative change right chest is stable in appearance. Electronically Signed   By: Inge Rise M.D.   On: 05/10/2016 14:03   Ct Head Wo Contrast  Result Date: 05/10/2016 CLINICAL DATA:  Dizziness. Syncopal episodes with transient loss of consciousness. History of lung carcinoma. EXAM: CT HEAD WITHOUT CONTRAST TECHNIQUE: Contiguous axial images were obtained from the base of the skull through the vertex without intravenous contrast. COMPARISON:  June 13, 2013 FINDINGS: Brain: There is mild diffuse atrophy. There is no intracranial mass hemorrhage, extra-axial fluid collection, or midline shift. There is mild small vessel disease in the centra semiovale bilaterally. No acute infarct is evident. Vascular: No hyperdense vessel evident. There is calcification in each carotid siphon region. Skull: The bony calvarium appears intact. No  blastic or lytic bone lesions are evident. Sinuses/Orbits: Visualized paranasal sinuses are clear. Orbits appear symmetric bilaterally. Other: Mastoid air cells are clear. IMPRESSION: Atrophy with periventricular small vessel disease, stable. No acute infarct evident. No mass, hemorrhage, or extra-axial fluid collection. Vascular calcification in each carotid siphon region noted. Electronically Signed   By: Lowella Grip III M.D.   On: 05/10/2016 15:12        Scheduled Meds: . enoxaparin (LOVENOX) injection  40 mg Subcutaneous Q24H  . folic acid  1 mg Oral Daily  . Influenza vac split quadrivalent PF  0.5 mL Intramuscular Tomorrow-1000  . LORazepam  1 mg Oral Daily  . losartan  50 mg Oral Daily  . metoprolol succinate  50 mg Oral Daily  . multivitamin with minerals  1 tablet Oral Daily  . pantoprazole  40 mg Oral Daily  . simvastatin  20 mg Oral QPM  . sodium chloride flush  3 mL Intravenous Q12H  . thiamine  100 mg Oral Daily   Or  . thiamine  100 mg Intravenous Daily   Continuous Infusions:    LOS: 1 day      Mellissa Conley, MD [facility] Pager [phone]  If 7PM-7AM, please contact night-coverage [email or web address] Password [email or web address] 05/11/2016, 3:57 PM

## 2016-05-12 ENCOUNTER — Other Ambulatory Visit (HOSPITAL_COMMUNITY): Payer: Medicare Other

## 2016-05-12 ENCOUNTER — Inpatient Hospital Stay (HOSPITAL_COMMUNITY): Payer: Medicare Other

## 2016-05-12 DIAGNOSIS — R55 Syncope and collapse: Secondary | ICD-10-CM

## 2016-05-12 LAB — BASIC METABOLIC PANEL
Anion gap: 8 (ref 5–15)
BUN: 14 mg/dL (ref 6–20)
CALCIUM: 9.2 mg/dL (ref 8.9–10.3)
CO2: 25 mmol/L (ref 22–32)
Chloride: 91 mmol/L — ABNORMAL LOW (ref 101–111)
Creatinine, Ser: 0.99 mg/dL (ref 0.44–1.00)
GFR, EST NON AFRICAN AMERICAN: 56 mL/min — AB (ref >60)
Glucose, Bld: 105 mg/dL — ABNORMAL HIGH (ref 65–99)
POTASSIUM: 3.5 mmol/L (ref 3.5–5.1)
Sodium: 124 mmol/L — ABNORMAL LOW (ref 135–145)

## 2016-05-12 LAB — VAS US CAROTID
LCCADDIAS: 16 cm/s
LCCADSYS: 104 cm/s
LCCAPSYS: 136 cm/s
LEFT ECA DIAS: -16 cm/s
LEFT VERTEBRAL DIAS: 20 cm/s
LICADSYS: -146 cm/s
Left CCA prox dias: 28 cm/s
Left ICA dist dias: -49 cm/s
Left ICA prox dias: -25 cm/s
Left ICA prox sys: -103 cm/s
RCCADSYS: -131 cm/s
RCCAPSYS: 94 cm/s
RIGHT ECA DIAS: 7 cm/s
RIGHT VERTEBRAL DIAS: 22 cm/s
Right CCA prox dias: 18 cm/s

## 2016-05-12 LAB — ECHOCARDIOGRAM COMPLETE: Weight: 2305.6 oz

## 2016-05-12 LAB — GLUCOSE, CAPILLARY: Glucose-Capillary: 101 mg/dL — ABNORMAL HIGH (ref 65–99)

## 2016-05-12 MED ORDER — FUROSEMIDE 20 MG PO TABS
20.0000 mg | ORAL_TABLET | Freq: Every day | ORAL | Status: AC
  Administered 2016-05-12 – 2016-05-13 (×2): 20 mg via ORAL
  Filled 2016-05-12 (×2): qty 1

## 2016-05-12 MED ORDER — GUAIFENESIN-CODEINE 100-10 MG/5ML PO SOLN
10.0000 mL | Freq: Four times a day (QID) | ORAL | Status: DC | PRN
  Administered 2016-05-12: 10 mL via ORAL
  Filled 2016-05-12: qty 10

## 2016-05-12 MED ORDER — POTASSIUM CHLORIDE CRYS ER 20 MEQ PO TBCR
40.0000 meq | EXTENDED_RELEASE_TABLET | Freq: Once | ORAL | Status: AC
  Administered 2016-05-12: 40 meq via ORAL
  Filled 2016-05-12: qty 2

## 2016-05-12 NOTE — Progress Notes (Signed)
*  PRELIMINARY RESULTS* Vascular Ultrasound Carotid Duplex (Doppler) has been completed.  Preliminary findings: Bilateral: No significant (1-39%) ICA stenosis. Antegrade vertebral flow.  Bilateral mid ICA demonstrates elevated velocities without evidence of plaque formation. Etiology unknown.  Landry Mellow, RDMS, RVT  05/12/2016, 2:44 PM

## 2016-05-12 NOTE — Progress Notes (Signed)
  Echocardiogram 2D Echocardiogram has been performed.  Tresa Res 05/12/2016, 3:29 PM

## 2016-05-12 NOTE — Progress Notes (Signed)
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 Abuse, presented to [facility] on 05/10/16 with a reported 2 episodes of syncope (first occurred approximately 3 weeks ago on a bus trip with colleagues to [facility] and the second one approximately 2 weeks ago while she was at [facility]) and both occurred when she was sitting up for sometime, felt lightheaded and faint prior to passing out. She saw her PCP after the second episode and was advised to come to the ED but did not until day of this admission. In the ED, CT head without acute findings, serum sodium 121, potassium 3, chloride 83.   Assessment & Plan:   Principal Problem:   Syncope Active Problems:   Hyperlipidemia   Lung cancer, Right upper lobe   S/P lobectomy of lung   CKD (chronic kidney disease) stage 3, GFR 30-59 ml/min   Acute hyponatremia   Uncontrolled hypertension   Acute hypokalemia   Restless legs syndrome   Hyponatremia   Syncope, recurrent - May be related to orthostatic changes (negative here) and hyponatremia. - Telemetry shows sinus rhythm without abnormalities. - Follow 2-D echo-reordered. Negative orthostatic changes on 05/10/16. Check carotid Dopplers. - Address hyponatremia as below.  Hyponatremia - May be multifactorial secondary to thiazide diuretics/chlorthalidone, beer use (potomania) & ? Polydipsia.  - Although serum and urinary osmolarity is low, patient denies excess free water intake. Thereby psychogenic polydipsia may be less likely.  - On 05/10/16: Serum osmolarity 253 and urine osmolarity 262. Based on this SIADH less likely. - Placed on fluid  restriction (not sure she followed this on 10/4. Has a pitcher of water next to her and seem to have been drinking ad lib.). Sodium has improved from 121 > 127 but has worsened to 124 on 10/5.  - DC CHLORTHALIDONE indefinitely. No mental status changes related to this. - Discussed with nephrologist on call who reviewed her case and agreed that her hyponatremia may be related to thiazide diuretics and beer use. Recommended trial of loop diuretics, fluid restriction and follow BMP. - Lasix 20 MG daily  Stage III chronic kidney disease Stable.  Uncontrolled hypertension -  As per report, multiple antihypertensive medications changes recently. Amlodipine had to be discontinued secondary to ankle edema. - Continue losartan and metoprolol and adjust doses as needed.  Hypokalemia - Secondary to chlorthalidone. Replace aggressively and follow. BMP in a.m. Magnesium 1.9.  Hyperlipidemia - Statins.  NSCLC - Outpatient follow-up with oncology.  Restless leg syndrome - Resumed Requip which she has been on for several years and is quite symptomatic at night.  Alcohol use/? Abuse Moderation/cessation counseled. CIWA . No overt withdrawal.   DVT prophylaxis: Lovenox Code Status: Full Family Communication: None at bedside Disposition Plan: DC home possibly in the next 24-48 hours pending improvement in her hyponatremia.   Consultants:   None   Procedures:   None   Antimicrobials:   None   Subjective: Denies complaints. States that she has been drinking liquids without restriction. Discussed with RN. No dizziness or lightheadedness.  Objective:  Vitals:   05/11/16 1425 05/11/16 2020 05/12/16 0514 05/12/16 1100  BP: (!) 161/67 (!) 171/74 (!) 150/69 127/61  Pulse: 64 66 (!)  55   Resp: '18 18 18   '$ Temp: 97.5 F (36.4 C) 98.2 F (36.8 C) 98.3 F (36.8 C)   TempSrc: Oral Oral Oral   SpO2: 100% 100% 96%   Weight:   65.4 kg (144 lb 1.6 oz)    No intake or output data in the 24  hours ending 05/12/16 1201 Filed Weights   05/10/16 1703 05/11/16 0540 05/12/16 0514  Weight: 66.8 kg (147 lb 4.3 oz) 64.5 kg (142 lb 3.2 oz) 65.4 kg (144 lb 1.6 oz)    Examination:  General exam: Pleasant elderly female sitting up comfortably in bed. Respiratory system: Clear to auscultation. Respiratory effort normal. Cardiovascular system: S1 & S2 heard, RRR. No JVD, murmurs, rubs, gallops or clicks. No pedal edema. Telemetry: SB in the 50s-SR. Gastrointestinal system: Abdomen is nondistended, soft and nontender. No organomegaly or masses felt. Normal bowel sounds heard. Central nervous system: Alert and oriented. No focal neurological deficits. Extremities: Symmetric 5 x 5 power. Skin: No rashes, lesions or ulcers Psychiatry: Judgement and insight appear normal. Mood & affect appropriate.     Data Reviewed: I have personally reviewed following labs and imaging studies  CBC:  Recent Labs Lab 05/10/16 1330 05/10/16 1439  WBC 6.0  --   NEUTROABS 4.2  --   HGB 12.8 12.9  HCT 36.5 38.0  MCV 92.2  --   PLT 209  --    Basic Metabolic Panel:  Recent Labs Lab 05/10/16 1330 05/10/16 1430 05/10/16 1439 05/10/16 1846 05/11/16 0248 05/12/16 0300  NA 122* 122* 121*  --  127* 124*  K 3.1* 2.9* 3.0*  --  2.9* 3.5  CL 85* 84* 83*  --  89* 91*  CO2 27 26  --   --  28 25  GLUCOSE 110* 109* 110*  --  108* 105*  BUN '14 13 15  '$ --  11 14  CREATININE 1.07* 1.03* 1.10*  --  0.99 0.99  CALCIUM 9.6 9.6  --   --  9.6 9.2  MG  --   --   --  1.9  --   --   PHOS  --   --   --  2.8  --   --    GFR: Estimated Creatinine Clearance: 45.7 mL/min (by C-G formula based on SCr of 0.99 mg/dL). Liver Function Tests:  Recent Labs Lab 05/10/16 1330 05/11/16 0248  AST 28 24  ALT 21 18  ALKPHOS 82 69  BILITOT 0.7 0.5  PROT 6.7 6.2*  ALBUMIN 4.3 3.9   No results for input(s): LIPASE, AMYLASE in the last 168 hours. No results for input(s): AMMONIA in the last 168 hours. Coagulation  Profile: No results for input(s): INR, PROTIME in the last 168 hours. Cardiac Enzymes: No results for input(s): CKTOTAL, CKMB, CKMBINDEX, TROPONINI in the last 168 hours. BNP (last 3 results) No results for input(s): PROBNP in the last 8760 hours. HbA1C: No results for input(s): HGBA1C in the last 72 hours. CBG:  Recent Labs Lab 05/10/16 1253 05/11/16 0545 05/12/16 0608  GLUCAP 121* 106* 101*   Lipid Profile: No results for input(s): CHOL, HDL, LDLCALC, TRIG, CHOLHDL, LDLDIRECT in the last 72 hours. Thyroid Function Tests: No results for input(s): TSH, T4TOTAL, FREET4, T3FREE, THYROIDAB in the last 72 hours. Anemia Panel: No results for input(s): VITAMINB12, FOLATE, FERRITIN, TIBC, IRON, RETICCTPCT in the last 72 hours.  Sepsis Labs: No results for input(s): PROCALCITON, LATICACIDVEN in the last 168 hours.  Recent Results (from the  past 240 hour(s))  Urine culture     Status: Abnormal   Collection Time: 05/10/16  3:09 PM  Result Value Ref Range Status   Specimen Description URINE, CLEAN CATCH  Final   Special Requests NONE  Final   Culture MULTIPLE SPECIES PRESENT, SUGGEST RECOLLECTION (A)  Final   Report Status 05/11/2016 FINAL  Final         Radiology Studies: Dg Chest 2 View  Result Date: 05/10/2016 CLINICAL DATA:  Recurrent syncopal episodes over the past 5-6 months. History of prior right upper lobectomy. EXAM: CHEST  2 VIEW COMPARISON:  CT chest 03/15/2016.  PA and lateral chest 11/28/2013. FINDINGS: Volume loss in the right chest and linear scarring in the right base consistent with history of lobectomy are again seen. The lungs are otherwise clear. Heart size is normal. No pneumothorax or pleural effusion. No focal bony abnormality. IMPRESSION: No acute disease. Postoperative change right chest is stable in appearance. Electronically Signed   By: Inge Rise M.D.   On: 05/10/2016 14:03   Ct Head Wo Contrast  Result Date: 05/10/2016 CLINICAL DATA:   Dizziness. Syncopal episodes with transient loss of consciousness. History of lung carcinoma. EXAM: CT HEAD WITHOUT CONTRAST TECHNIQUE: Contiguous axial images were obtained from the base of the skull through the vertex without intravenous contrast. COMPARISON:  June 13, 2013 FINDINGS: Brain: There is mild diffuse atrophy. There is no intracranial mass hemorrhage, extra-axial fluid collection, or midline shift. There is mild small vessel disease in the centra semiovale bilaterally. No acute infarct is evident. Vascular: No hyperdense vessel evident. There is calcification in each carotid siphon region. Skull: The bony calvarium appears intact. No blastic or lytic bone lesions are evident. Sinuses/Orbits: Visualized paranasal sinuses are clear. Orbits appear symmetric bilaterally. Other: Mastoid air cells are clear. IMPRESSION: Atrophy with periventricular small vessel disease, stable. No acute infarct evident. No mass, hemorrhage, or extra-axial fluid collection. Vascular calcification in each carotid siphon region noted. Electronically Signed   By: Lowella Grip III M.D.   On: 05/10/2016 15:12        Scheduled Meds: . enoxaparin (LOVENOX) injection  40 mg Subcutaneous Q24H  . folic acid  1 mg Oral Daily  . furosemide  20 mg Oral Daily  . Influenza vac split quadrivalent PF  0.5 mL Intramuscular Tomorrow-1000  . LORazepam  1 mg Oral Daily  . losartan  50 mg Oral Daily  . metoprolol succinate  50 mg Oral Daily  . multivitamin with minerals  1 tablet Oral Daily  . pantoprazole  40 mg Oral Daily  . rOPINIRole  0.5 mg Oral QHS  . simvastatin  20 mg Oral QPM  . sodium chloride flush  3 mL Intravenous Q12H  . thiamine  100 mg Oral Daily   Continuous Infusions:    LOS: 2 days      [facility], MD [facility] Pager [phone]  If 7PM-7AM, please contact night-coverage [email or web address] Password [email or web address] 05/12/2016, 12:01 PM

## 2016-05-13 LAB — BASIC METABOLIC PANEL
ANION GAP: 10 (ref 5–15)
BUN: 14 mg/dL (ref 6–20)
CALCIUM: 9.8 mg/dL (ref 8.9–10.3)
CO2: 28 mmol/L (ref 22–32)
Chloride: 91 mmol/L — ABNORMAL LOW (ref 101–111)
Creatinine, Ser: 1.14 mg/dL — ABNORMAL HIGH (ref 0.44–1.00)
GFR calc Af Amer: 55 mL/min — ABNORMAL LOW (ref >60)
GFR, EST NON AFRICAN AMERICAN: 47 mL/min — AB (ref >60)
GLUCOSE: 100 mg/dL — AB (ref 65–99)
Potassium: 4.2 mmol/L (ref 3.5–5.1)
SODIUM: 129 mmol/L — AB (ref 135–145)

## 2016-05-13 LAB — GLUCOSE, CAPILLARY: GLUCOSE-CAPILLARY: 111 mg/dL — AB (ref 65–99)

## 2016-05-13 MED ORDER — ADULT MULTIVITAMIN W/MINERALS CH: 1.0000 | ORAL_TABLET | Freq: Every day | ORAL | Status: AC

## 2016-05-13 MED ORDER — LOSARTAN POTASSIUM 25 MG PO TABS: 50.0000 mg | ORAL_TABLET | Freq: Every day | ORAL | 0 refills | Status: DC

## 2016-05-13 MED ORDER — FUROSEMIDE 20 MG PO TABS: 20.0000 mg | ORAL_TABLET | Freq: Every day | ORAL | 0 refills | Status: AC

## 2016-05-13 MED ORDER — THIAMINE HCL 100 MG PO TABS: 100.0000 mg | ORAL_TABLET | Freq: Every day | ORAL | 0 refills | Status: DC

## 2016-05-13 MED ORDER — GUAIFENESIN-DM 100-10 MG/5ML PO SYRP: 5.0000 mL | ORAL_SOLUTION | ORAL | 0 refills | Status: DC | PRN

## 2016-05-13 MED ORDER — FOLIC ACID 1 MG PO TABS: 1.0000 mg | ORAL_TABLET | Freq: Every day | ORAL | 0 refills | Status: DC

## 2016-05-13 NOTE — Progress Notes (Signed)
Patient in a stable condition, discharge education completed patient verbalised understanding, paper prescription given to patient patient belongings at bedside, iv dc tele dc ccmd notified, patient taken off the unit on a wheelchair by a staff.

## 2016-05-13 NOTE — Care Management Important Message (Signed)
Important Message  Patient Details  Name: [name] MRN: [id number] Date of Birth: [birth year]   Medicare Important Message Given:  Yes    [name] Abena 05/13/2016, 1:08 PM

## 2016-05-13 NOTE — Discharge Summary (Signed)
Physician Discharge Summary  [name] QIO:[id number] DOB: 01-Feb-1946  PCP: Meghan Jacobson, MD  Admit date: 05/10/2016 Discharge date: 05/13/2016  Admitted From: Home  Disposition:  Home  Recommendations for Outpatient Follow-up:  1. Dr. [name], PCP in 4 days with repeat labs (CBC & BMP).  Home Health: None Equipment/Devices: None    Discharge Condition: Improved and stable  CODE STATUS: Full  Diet recommendation: Heart Healthy diet.  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 Abuse, presented to [facility] on 05/10/16 with a reported 2 episodes of syncope (first occurred approximately 3 weeks ago on a bus trip with colleagues to [facility] and the second one approximately 2 weeks ago while she was at [facility]) and both occurred when she was sitting up for sometime, felt lightheaded and faint prior to passing out. She saw her PCP after the second episode and was advised to come to the ED but did not until day of this admission. In the ED, CT head without acute findings, serum sodium 121, potassium 3, chloride 83.   Assessment & Plan:   Syncope, recurrent - May be related to orthostatic changes (negative here) and hyponatremia. - Telemetry shows sinus rhythm without abnormalities. - Negative orthostatic changes on 05/10/16.  - Addressed hyponatremia as below. - Carotid Dopplers: Bilateral 1-39 percent ICA  stenosis and antegrade vertebral flow. - 2-D echo results as below and unremarkable. LVEF 60-65%. - Asymptomatic of dizziness, lightheadedness, chest pain or palpitations with activity. - Patient has been counseled extensively that she should not drive for 6 months or until cleared by her physician during outpatient follow-up. She was also consulted to abstain from alcohol use. Patient's son was also at the bedside during this discussion and both verbalized understanding.  Hyponatremia - May be multifactorial secondary to thiazide diuretics/chlorthalidone, beer use (potomania) & ? Polydipsia.  - Although serum and urinary osmolarity is low, patient denies excess free water intake. Thereby psychogenic polydipsia seems less likely.  - On 05/10/16: Serum osmolarity 253 and urine osmolarity 262. Based on this SIADH less likely. - Placed on fluid restriction (not sure she followed this on 10/4. Has a pitcher of water next to her and seem to have been drinking ad lib.). Sodium has improved from 121 > 127 but has worsened to 124 on 10/5.  - DC CHLORTHALIDONE indefinitely. No mental status changes related to this. - Discussed with nephrologist on call who reviewed her case and agreed that her hyponatremia may be related to thiazide diuretics and beer use. Recommended trial of loop diuretics, fluid restriction and follow BMP. - Lasix 20 MG daily - Sodium has improved to 129. Again discussed with nephrology and recommend continuing Lasix at discharge. Discontinued chlorthalidone indefinitely. Counseled regarding fluid restriction and abstinence from alcohol use. Follow BMP in a few days as outpatient.  Stage III chronic kidney disease Stable.  Uncontrolled hypertension -  As per report, multiple antihypertensive medications changes recently. Amlodipine had to be discontinued  secondary to ankle edema. - Continue losartan and metoprolol and adjust doses as needed.  Hypokalemia - Secondary to  chlorthalidone. Replaced. Magnesium 1.9.  Hyperlipidemia - Statins.  NSCLC - Outpatient follow-up with oncology.  Restless leg syndrome - Resumed Requip which she has been on for several years and is quite symptomatic at night.  Alcohol use/? Abuse Cessation counseled. CIWA . No overt withdrawal.    Consultants:   None   Procedures:   Carotid Doppler: Summary:  - The vertebral arteries appear patent with antegrade flow. - Findings consistent with 1-39 percent stenosis involving the   right internal carotid artery and the left internal carotid   artery. - Bilateral mid ICA demonstrates elevated velocities without   evidence of plaque formation. Etiology unknown.   2 D Echo 05/13/2016: Study Conclusions  - Left ventricle: The cavity size was normal. There was moderate   concentric hypertrophy. Systolic function was normal. The   estimated ejection fraction was in the range of 60% to 65%. Wall   motion was normal; there were no regional wall motion   abnormalities. Doppler parameters are consistent with abnormal   left ventricular relaxation (grade 1 diastolic dysfunction). - Aortic valve: There was mild regurgitation.  Discharge Instructions  Discharge Instructions    Call MD for:    Complete by:  As directed    Passing out.   Call MD for:  extreme fatigue    Complete by:  As directed    Call MD for:  persistant dizziness or light-headedness    Complete by:  As directed    Diet - low sodium heart healthy    Complete by:  As directed    Driving Restrictions    Complete by:  As directed    Do not drive until cleared by your physician during out patient follow up.   Increase activity slowly    Complete by:  As directed        Medication List    STOP taking these medications   chlorthalidone 25 MG tablet Commonly known as:  HYGROTON   methocarbamol 500 MG tablet Commonly known as:  ROBAXIN   oxyCODONE-acetaminophen 5-325 MG tablet Commonly  known as:  PERCOCET/ROXICET     TAKE these medications   esomeprazole 40 MG capsule Commonly known as:  NEXIUM Take 40 mg by mouth daily before breakfast.   folic acid 1 MG tablet Commonly known as:  FOLVITE Take 1 tablet (1 mg total) by mouth daily. Start taking on:  05/14/2016   furosemide 20 MG tablet Commonly known as:  LASIX Take 1 tablet (20 mg total) by mouth daily.   guaiFENesin-dextromethorphan 100-10 MG/5ML syrup Commonly known as:  ROBITUSSIN DM Take 5 mLs by mouth every 4 (four) hours as needed for cough.   KLOR-CON 10 10 MEQ tablet Generic drug:  potassium chloride Take 10 mEq by mouth daily.   LORazepam 1 MG tablet Commonly known as:  ATIVAN Take 1 mg by mouth daily.   losartan 25 MG tablet Commonly known as:  COZAAR Take 2 tablets (50 mg total) by mouth daily. What changed:  how much to take   metoprolol succinate 50 MG 24 hr tablet Commonly known as:  TOPROL-XL Take 50 mg by mouth daily. Take with or immediately following a meal.   multivitamin with minerals Tabs tablet Take 1 tablet by mouth daily. Start taking on:  05/14/2016   rOPINIRole 0.25 MG tablet Commonly known as:  REQUIP Take 0.5 mg by mouth at  bedtime.   simvastatin 20 MG tablet Commonly known as:  ZOCOR Take 20 mg by mouth every evening.   thiamine 100 MG tablet Take 1 tablet (100 mg total) by mouth daily. Start taking on:  05/14/2016      Follow-up Information    ROBERTS, Sharol Given, MD. Schedule an appointment as soon as possible for a visit in 4 day(s).   Specialty:  Internal Medicine Why:  To be seen with repeat labs (CBC & BMP). Contact information: [address] [place] [address] [phone]          No Known Allergies    Procedures/Studies: Dg Chest 2 View  Result Date: 05/10/2016 CLINICAL DATA:  Recurrent syncopal episodes over the past 5-6 months. History of prior right upper lobectomy. EXAM: CHEST  2 VIEW COMPARISON:  CT chest 03/15/2016.  PA  and lateral chest 11/28/2013. FINDINGS: Volume loss in the right chest and linear scarring in the right base consistent with history of lobectomy are again seen. The lungs are otherwise clear. Heart size is normal. No pneumothorax or pleural effusion. No focal bony abnormality. IMPRESSION: No acute disease. Postoperative change right chest is stable in appearance. Electronically Signed   By: Inge Rise M.D.   On: 05/10/2016 14:03   Ct Head Wo Contrast  Result Date: 05/10/2016 CLINICAL DATA:  Dizziness. Syncopal episodes with transient loss of consciousness. History of lung carcinoma. EXAM: CT HEAD WITHOUT CONTRAST TECHNIQUE: Contiguous axial images were obtained from the base of the skull through the vertex without intravenous contrast. COMPARISON:  June 13, 2013 FINDINGS: Brain: There is mild diffuse atrophy. There is no intracranial mass hemorrhage, extra-axial fluid collection, or midline shift. There is mild small vessel disease in the centra semiovale bilaterally. No acute infarct is evident. Vascular: No hyperdense vessel evident. There is calcification in each carotid siphon region. Skull: The bony calvarium appears intact. No blastic or lytic bone lesions are evident. Sinuses/Orbits: Visualized paranasal sinuses are clear. Orbits appear symmetric bilaterally. Other: Mastoid air cells are clear. IMPRESSION: Atrophy with periventricular small vessel disease, stable. No acute infarct evident. No mass, hemorrhage, or extra-axial fluid collection. Vascular calcification in each carotid siphon region noted. Electronically Signed   By: Lowella Grip III M.D.   On: 05/10/2016 15:12      Subjective: Patient denies complaints and is anxious to go home. No dizziness, lightheadedness, chest pain, dyspnea, headache or palpitations. Son at bedside.  Discharge Exam:  Vitals:   05/12/16 1553 05/12/16 1948 05/13/16 0516 05/13/16 1009  BP: 130/84 (!) 155/56 (!) 145/68 (!) 142/61  Pulse: 70 64 60  81  Resp: '19 18 18 18  '$ Temp: 98.7 F (37.1 C) 98.2 F (36.8 C) 97.6 F (36.4 C) 98.6 F (37 C)  TempSrc: Oral Oral Oral Tympanic  SpO2: 100% 100% 98% 98%  Weight:   64.8 kg (142 lb 12.8 oz)     General exam: Pleasant elderly female sitting up comfortably in bed. Respiratory system: Clear to auscultation. Respiratory effort normal. Cardiovascular system: S1 & S2 heard, RRR. No JVD, murmurs, rubs, gallops or clicks. No pedal edema. Telemetry: SB in the 50s-SR. Gastrointestinal system: Abdomen is nondistended, soft and nontender. No organomegaly or masses felt. Normal bowel sounds heard. Central nervous system: Alert and oriented. No focal neurological deficits. Extremities: Symmetric 5 x 5 power. Skin: No rashes, lesions or ulcers Psychiatry: Judgement and insight appear normal. Mood & affect appropriate.    The results of significant diagnostics from this hospitalization (including imaging,  microbiology, ancillary and laboratory) are listed below for reference.     Microbiology: Recent Results (from the past 240 hour(s))  Urine culture     Status: Abnormal   Collection Time: 05/10/16  3:09 PM  Result Value Ref Range Status   Specimen Description URINE, CLEAN CATCH  Final   Special Requests NONE  Final   Culture MULTIPLE SPECIES PRESENT, SUGGEST RECOLLECTION (A)  Final   Report Status 05/11/2016 FINAL  Final     Labs: BNP (last 3 results) No results for input(s): BNP in the last 8760 hours. Basic Metabolic Panel:  Recent Labs Lab 05/10/16 1330 05/10/16 1430 05/10/16 1439 05/10/16 1846 05/11/16 0248 05/12/16 0300 05/13/16 0301  NA 122* 122* 121*  --  127* 124* 129*  K 3.1* 2.9* 3.0*  --  2.9* 3.5 4.2  CL 85* 84* 83*  --  89* 91* 91*  CO2 27 26  --   --  '28 25 28  '$ GLUCOSE 110* 109* 110*  --  108* 105* 100*  BUN '14 13 15  '$ --  '11 14 14  '$ CREATININE 1.07* 1.03* 1.10*  --  0.99 0.99 1.14*  CALCIUM 9.6 9.6  --   --  9.6 9.2 9.8  MG  --   --   --  1.9  --   --   --    PHOS  --   --   --  2.8  --   --   --    Liver Function Tests:  Recent Labs Lab 05/10/16 1330 05/11/16 0248  AST 28 24  ALT 21 18  ALKPHOS 82 69  BILITOT 0.7 0.5  PROT 6.7 6.2*  ALBUMIN 4.3 3.9   No results for input(s): LIPASE, AMYLASE in the last 168 hours. No results for input(s): AMMONIA in the last 168 hours. CBC:  Recent Labs Lab 05/10/16 1330 05/10/16 1439  WBC 6.0  --   NEUTROABS 4.2  --   HGB 12.8 12.9  HCT 36.5 38.0  MCV 92.2  --   PLT 209  --    Cardiac Enzymes: No results for input(s): CKTOTAL, CKMB, CKMBINDEX, TROPONINI in the last 168 hours. BNP: Invalid input(s): POCBNP CBG:  Recent Labs Lab 05/10/16 1253 05/11/16 0545 05/12/16 0608 05/13/16 0610  GLUCAP 121* 106* 101* 111*   Urinalysis    Component Value Date/Time   COLORURINE YELLOW 05/10/2016 1509   APPEARANCEUR CLEAR 05/10/2016 1509   LABSPEC 1.010 05/10/2016 1509   PHURINE 7.0 05/10/2016 1509   GLUCOSEU NEGATIVE 05/10/2016 1509   HGBUR TRACE (A) 05/10/2016 1509   BILIRUBINUR NEGATIVE 05/10/2016 1509   KETONESUR NEGATIVE 05/10/2016 1509   PROTEINUR NEGATIVE 05/10/2016 1509   UROBILINOGEN 0.2 07/03/2013 0839   NITRITE NEGATIVE 05/10/2016 1509   LEUKOCYTESUR SMALL (A) 05/10/2016 1509   Discussed in detail with patient's son at bedside. Updated care and answered questions.   Time coordinating discharge: Over 30 minutes  SIGNED:  Vernell Leep, MD, FACP, [place]. [facility] Pager [phone]  If 7PM-7AM, please contact night-coverage [email or web address] Password [email or web address] 05/13/2016, 5:59 PM

## 2016-05-13 NOTE — Discharge Instructions (Signed)
Hyponatremia Hyponatremia is when the amount of salt (sodium) in your blood is too low. When sodium levels are low, your cells absorb extra water and they swell. The swelling happens throughout the body, but it mostly affects the brain. CAUSES This condition may be caused by:  Heart, kidney, or liver problems.  Thyroid problems.  Adrenal gland problems.  Metabolic conditions, such as syndrome of inappropriate antidiuretic hormone (SIADH).  Severe vomiting and diarrhea.  Certain medicines or illegal drugs.  Dehydration.  Drinking too much water.  Eating a diet that is low in sodium.  Large burns on your body.  Sweating. RISK FACTORS This condition is more likely to develop in people who:  Have long-term (chronic) kidney disease.  Have heart failure.  Have a medical condition that causes frequent or excessive diarrhea.  Have metabolic conditions, such as Addison disease or SIADH.  Take certain medicines that affect the sodium and fluid balance in the blood. Some of these medicine types include:  Diuretics.  NSAIDs.  Some opioid pain medicines.  Some antidepressants.  Some seizure prevention medicines. SYMPTOMS  Symptoms of this condition include:  Nausea and vomiting.  Confusion.  Lethargy.  Agitation.  Headache.  Seizures.  Unconsciousness.  Appetite loss.  Muscle weakness and cramping.  Feeling weak or light-headed.  Having a rapid heart rate.  Fainting, in severe cases. DIAGNOSIS This condition is diagnosed with a medical history and physical exam. You will also have other tests, including:  Blood tests.  Urine tests. TREATMENT Treatment for this condition depends on the cause. Treatment may include:  Fluids given through an IV tube that is inserted into one of your veins.  Medicines to correct the sodium imbalance. If medicines are causing the condition, the medicines will need to be adjusted.  Limiting water or fluid intake to  get the correct sodium balance. HOME CARE INSTRUCTIONS  Take medicines only as directed by your health care provider. Many medicines can make this condition worse. Talk with your health care provider about any medicines that you are currently taking.  Carefully follow a recommended diet as directed by your health care provider.  Carefully follow instructions from your health care provider about fluid restrictions.  Keep all follow-up visits as directed by your health care provider. This is important.  Do not drink alcohol. SEEK MEDICAL CARE IF:  You develop worsening nausea, fatigue, headache, confusion, or weakness.  Your symptoms go away and then return.  You have problems following the recommended diet. SEEK IMMEDIATE MEDICAL CARE IF:  You have a seizure.  You faint.  You have ongoing diarrhea or vomiting.   This information is not intended to replace advice given to you by your health care provider. Make sure you discuss any questions you have with your health care provider.   Document Released: 07/15/2002 Document Revised: 12/09/2014 Document Reviewed: 08/14/2014 Elsevier Interactive Patient Education 2016 [employer].   Syncope Syncope is a medical term for fainting or passing out. This means you lose consciousness and drop to the ground. People are generally unconscious for less than 5 minutes. You may have some muscle twitches for up to 15 seconds before waking up and returning to normal. Syncope occurs more often in older adults, but it can happen to anyone. While most causes of syncope are not dangerous, syncope can be a sign of a serious medical problem. It is important to seek medical care.  CAUSES  Syncope is caused by a sudden drop in blood flow to the  brain. The specific cause is often not determined. Factors that can bring on syncope include:  Taking medicines that lower blood pressure.  Sudden changes in posture, such as standing up quickly.  Taking more  medicine than prescribed.  Standing in one place for too long.  Seizure disorders.  Dehydration and excessive exposure to heat.  Low blood sugar (hypoglycemia).  Straining to have a bowel movement.  Heart disease, irregular heartbeat, or other circulatory problems.  Fear, emotional distress, seeing blood, or severe pain. SYMPTOMS  Right before fainting, you may:  Feel dizzy or light-headed.  Feel nauseous.  See all white or all black in your field of vision.  Have cold, clammy skin. DIAGNOSIS  Your health care provider will ask about your symptoms, perform a physical exam, and perform an electrocardiogram (ECG) to record the electrical activity of your heart. Your health care provider may also perform other heart or blood tests to determine the cause of your syncope which may include:  Transthoracic echocardiogram (TTE). During echocardiography, sound waves are used to evaluate how blood flows through your heart.  Transesophageal echocardiogram (TEE).  Cardiac monitoring. This allows your health care provider to monitor your heart rate and rhythm in real time.  Holter monitor. This is a portable device that records your heartbeat and can help diagnose heart arrhythmias. It allows your health care provider to track your heart activity for several days, if needed.  Stress tests by exercise or by giving medicine that makes the heart beat faster. TREATMENT  In most cases, no treatment is needed. Depending on the cause of your syncope, your health care provider may recommend changing or stopping some of your medicines. HOME CARE INSTRUCTIONS  Have someone stay with you until you feel stable.  Do not drive, use machinery, or play sports until your health care provider says it is okay.  Keep all follow-up appointments as directed by your health care provider.  Lie down right away if you start feeling like you might faint. Breathe deeply and steadily. Wait until all the  symptoms have passed.  Drink enough fluids to keep your urine clear or pale yellow.  If you are taking blood pressure or heart medicine, get up slowly and take several minutes to sit and then stand. This can reduce dizziness. SEEK IMMEDIATE MEDICAL CARE IF:   You have a severe headache.  You have unusual pain in the chest, abdomen, or back.  You are bleeding from your mouth or rectum, or you have black or tarry stool.  You have an irregular or very fast heartbeat.  You have pain with breathing.  You have repeated fainting or seizure-like jerking during an episode.  You faint when sitting or lying down.  You have confusion.  You have trouble walking.  You have severe weakness.  You have vision problems. If you fainted, call your local emergency services (911 in [place] Do not drive yourself to the hospital.    This information is not intended to replace advice given to you by your health care provider. Make sure you discuss any questions you have with your health care provider.   Document Released: 07/25/2005 Document Revised: 12/09/2014 Document Reviewed: 09/23/2011 Elsevier Interactive Patient Education [employer].

## 2016-08-04 ENCOUNTER — Other Ambulatory Visit: Payer: Self-pay | Admitting: Nurse Practitioner

## 2016-09-15 ENCOUNTER — Encounter: Payer: Medicare Other | Admitting: Cardiothoracic Surgery

## 2017-02-24 ENCOUNTER — Encounter: Payer: Self-pay | Admitting: Cardiology

## 2017-02-24 DIAGNOSIS — I700 Atherosclerosis of aorta: Secondary | ICD-10-CM | POA: Insufficient documentation

## 2017-02-24 NOTE — Consult Note (Signed)
[name], [name]  Date of visit:  02/24/2017 DOB:  [birth year]    Age:  71 yrs. Medical record number:  [id number]     Account number:  [id number] Primary Care Provider: [name] ____________________________ CURRENT DIAGNOSES  1. Chronic kidney disease, stage 3 (moderate)  2. Hypertensive heart disease without heart failure  3. Atherosclerosis of aorta  4. Hyperlipidemia  5. Personal History Of Malignant Neoplasm Of Bronchus And Lung  6. Syncope and collapse ____________________________ ALLERGIES  Thiazides, Hyponatremia (low serum sodium) ____________________________ MEDICATIONS  1. aspirin 81 mg chewable tablet, 1 p.o. daily  2. diltiazem 120 mg tablet, 1 p.o. daily  3. furosemide 20 mg tablet, MWF  4. Klor-Con 10 mEq tablet,extended release, 1 p.o. daily  5. lorazepam 1 mg tablet, Take as directed  6. losartan 50 mg tablet, 1 p.o. daily  7. metoprolol succinate ER 50 mg tablet,extended release 24 hr, 1 p.o. daily  8. Nexium 40 mg capsule,delayed release, 1 p.o. daily  9. ropinirole 0.25 mg tablet, 1 p.o. daily  10. Vitamin D3 1,000 unit tablet, 1 p.o. daily ____________________________ HISTORY OF PRESENT ILLNESS This very nice 71 year old female is seen for evaluation of recurrent syncope. The patient has a several year history of syncope that is intermittent. She describes an episode when she was riding a bus to [facility] felt dizzy lightheaded and then her hearing when out her vision went out and she stated she was unconscious for 45 minutes but did not seek medical attention. She had a second episode that occurred while she was sitting at the [facility] and described a feeling of feeling lightheaded, lost her hearing her vision became faint and she felt weak. She saw her PCP and then later went to the emergency room at a days later where she was found to be severely hyponatremic. At the time chlorthalidone was discontinued and there was a history of some beer consumption at that  time also. She had some renal insufficiency noted but the symptoms resolved. She had felt fairly well since then but had another syncopal episode that occurred while she was on a trip to [place]. She described very hot temperature and had had a couple of drinks at a bar and was out walking around when she began to experience lightheadedness, loss of hearing and she was able to get over to a wall and lean against it. She evidently then slumped to the floor twisting her ankle. She was attended to by a number of people but EMS was not called. She stated that she was out for at least 10 minutes and no incontinence was noted and no seizure activity was noted. She recalls having been given some cold water and also having a fan on her. She has not had recurrent syncope since then. When she was evaluated in October she had an echocardiogram that showed moderate concentric LVH with normal systolic function. Recent sodium has been 132. She does admit to drinking about 2 beers per day. She has never had seizures. She denies PND, orthopnea or claudication. She has a remote history of lung cancer that was resected.  ____________________________ PAST HISTORY  Past Medical Illnesses:  hypertension, hyperlipidemia, History of lung cancer with resection, restless legs,  Cardiovascular Illnesses:  no previous history of cardiac disease;  Infectious Diseases:  no previous history of significant infectious diseases;  Surgical Procedures:  tracheostomy, hysterectomy-subtotal, wedge resection of lung for cancer 2014;  Trauma History:  no previous history of significant trauma;  NYHA Classification:  I;  [place] Angina Classification:  Class 0: Asymptomatic;  Cardiology Procedures-Invasive:  no previous interventional or invasive cardiology procedures;  Cardiology Procedures-Noninvasive:  no previous non-invasive cardiovascular testing;  Peripheral Vascular Procedures:  no previous invasive peripheral vascular procedures.;  LVEF of  65% documented via echocardiogram on 05/12/2016,   ____________________________ CARDIO-PULMONARY TEST DATES EKG Date:  02/24/2017;  Echocardiography Date: 05/12/2016;   ____________________________ FAMILY HISTORY Father -- Father dead Mother -- Mother dead ____________________________ SOCIAL HISTORY Alcohol Use:  beer and daily;  Smoking:  used to smoke but quit Prior to 1980;  Diet:  regular diet;  Lifestyle:  married;  Exercise:  no regular exercise;  Occupation:  retired, from [employer];  Residence:  lives with husband;   ____________________________ REVIEW OF SYSTEMS General:  denies recent weight change, fatique or change in exercise tolerance.  Integumentary:no rashes or new skin lesions. Eyes: wears eye glasses/contact lenses Ears, Nose, Throat, Mouth:  denies any hearing loss, epistaxis, hoarseness or difficulty speaking. Respiratory: dyspnea with exertion Cardiovascular:  please review HPI Abdominal: history of GERDGenitourinary-Female: frequency Musculoskeletal:  chronic low back pain, scoliosis Neurological:  syncope Psychiatric:  denies depression or anxiety Hematological/Immunologic:  denies any food allergies, bleeding disorders. ____________________________ PHYSICAL EXAMINATION VITAL SIGNS  Blood Pressure:  140/86 Sitting, Right arm, regular cuff  , 144/74 Standing, Right arm and regular cuff   Pulse:  72/min. Weight:  154.00 lbs. Height:  65"BMI: 25  Constitutional:  pleasant white female, in no acute distress Skin:  warm and dry to touch, no apparent skin lesions, or masses noted. Head:  normocephalic, normal hair pattern, no masses or tenderness Eyes:  EOMS Intact, PERRLA, C and S clear, Funduscopic exam not done. ENT:  ears, nose and throat reveal no gross abnormalities.  Dentition good. Neck:  supple, without massess. No JVD, thyromegaly or carotid bruits. Carotid upstroke normal. Chest:  normal symmetry, clear to auscultation. Cardiac:  regular rhythm, normal S1 and S2,  No S3 or S4, no murmurs, gallops or rubs detected. Abdomen:  abdomen soft,non-tender, no masses, no hepatospenomegaly, or aneurysm noted Peripheral Pulses:  the femoral,dorsalis pedis, and posterior tibial pulses are full and equal bilaterally with no bruits auscultated. Extremities & Back:  bilateral venous insufficiency changes present Neurological:  no gross motor or sensory deficits noted, affect appropriate, oriented x3. ____________________________ IMPRESSIONS/PLAN  1. Sporadic syncope which is atypical for cardiac causes and that the duration of unconsciousness his prolonged period this could be arrhythmic although it is atypical or could represent atypical seizures I guess. 2. Hypertension controlled 3. History of mild carotid artery disease on recent evaluation in the hospital 4. History of aortic calcification noted on CT scan  Recommendations:  Twelve-lead EKG personally reviewed by me as normal. I reviewed the hospitalization from October as well as recent lab work. She just had an echo in October so I don't think this needs to be repeated. My thought is that to exclude an arrhythmic cause would require implantation of a loop monitor which can labeled recording for rupture 3-4 years. I don't see a value and a 30 day event monitor. It also might be worthwhile considering a neurologic consultation because of the duration that she is unconscious. I quizzed her carefully about her alcohol use and she states that she really doesn't drink more than 2 beers per day. I will arrange an evaluation with the electrophysiologist to see if they agree with putting in a loop monitor. Thank you for asking me to see her with you.  ____________________________  TODAYS ORDERS  1. 12 Lead EKG: Today  2.  Consult EP: convenience                       ____________________________ Cardiology Physician:  Kerry Hough MD [facility]

## 2017-02-27 ENCOUNTER — Telehealth: Payer: Self-pay

## 2017-02-27 NOTE — Telephone Encounter (Signed)
Call placed to Pt.  Left VM requesting call back.  This nurse name and # left.  Need to see if Pt wants to schedule appt with Dr. Lovena Le before loop implant.  Need to schedule loop implant.

## 2017-02-28 NOTE — Telephone Encounter (Signed)
Follow Up:; ° ° °Returning your call. °

## 2017-03-01 NOTE — Telephone Encounter (Signed)
Call placed to Pt.  Scheduled for loop implant on 03/30/2017 @  730.  Pt notified to arrive to admissions at [facility].

## 2017-03-14 ENCOUNTER — Encounter (HOSPITAL_COMMUNITY): Payer: Self-pay

## 2017-03-14 ENCOUNTER — Ambulatory Visit (HOSPITAL_COMMUNITY)
Admission: RE | Admit: 2017-03-14 | Discharge: 2017-03-14 | Disposition: A | Discharge status: Home / Self Care | Payer: Medicare Other | Source: Ambulatory Visit | Attending: Internal Medicine | Admitting: Internal Medicine

## 2017-03-14 ENCOUNTER — Other Ambulatory Visit (HOSPITAL_BASED_OUTPATIENT_CLINIC_OR_DEPARTMENT_OTHER): Payer: Medicare Other

## 2017-03-14 DIAGNOSIS — I10 Essential (primary) hypertension: Secondary | ICD-10-CM

## 2017-03-14 DIAGNOSIS — R918 Other nonspecific abnormal finding of lung field: Secondary | ICD-10-CM | POA: Diagnosis not present

## 2017-03-14 DIAGNOSIS — C3411 Malignant neoplasm of upper lobe, right bronchus or lung: Secondary | ICD-10-CM

## 2017-03-14 DIAGNOSIS — I700 Atherosclerosis of aorta: Secondary | ICD-10-CM | POA: Diagnosis not present

## 2017-03-14 DIAGNOSIS — J432 Centrilobular emphysema: Secondary | ICD-10-CM | POA: Insufficient documentation

## 2017-03-14 LAB — COMPREHENSIVE METABOLIC PANEL
ALT: 13 U/L (ref 0–55)
AST: 18 U/L (ref 5–34)
Albumin: 3.8 g/dL (ref 3.5–5.0)
Alkaline Phosphatase: 162 U/L — ABNORMAL HIGH (ref 40–150)
Anion Gap: 9 mEq/L (ref 3–11)
BILIRUBIN TOTAL: 0.5 mg/dL (ref 0.20–1.20)
BUN: 13.1 mg/dL (ref 7.0–26.0)
CALCIUM: 9.8 mg/dL (ref 8.4–10.4)
CHLORIDE: 97 meq/L — AB (ref 98–109)
CO2: 26 meq/L (ref 22–29)
CREATININE: 1.1 mg/dL (ref 0.6–1.1)
EGFR: 50 mL/min/{1.73_m2} — ABNORMAL LOW (ref >90)
Glucose: 104 mg/dl (ref 70–140)
Potassium: 4.5 mEq/L (ref 3.5–5.1)
Sodium: 132 mEq/L — ABNORMAL LOW (ref 136–145)
TOTAL PROTEIN: 6.9 g/dL (ref 6.4–8.3)

## 2017-03-14 LAB — CBC WITH DIFFERENTIAL/PLATELET
BASO%: 1 % (ref 0.0–2.0)
Basophils Absolute: 0.1 10*3/uL (ref 0.0–0.1)
EOS%: 4.9 % (ref 0.0–7.0)
Eosinophils Absolute: 0.3 10*3/uL (ref 0.0–0.5)
HEMATOCRIT: 37 % (ref 34.8–46.6)
HGB: 12.3 g/dL (ref 11.6–15.9)
LYMPH#: 1.7 10*3/uL (ref 0.9–3.3)
LYMPH%: 29.6 % (ref 14.0–49.7)
MCH: 30.8 pg (ref 25.1–34.0)
MCHC: 33.3 g/dL (ref 31.5–36.0)
MCV: 92.4 fL (ref 79.5–101.0)
MONO#: 0.5 10*3/uL (ref 0.1–0.9)
MONO%: 9.1 % (ref 0.0–14.0)
NEUT%: 55.4 % (ref 38.4–76.8)
NEUTROS ABS: 3.2 10*3/uL (ref 1.5–6.5)
Platelets: 331 10*3/uL (ref 145–400)
RBC: 4.01 10*6/uL (ref 3.70–5.45)
RDW: 13.9 % (ref 11.2–14.5)
WBC: 5.8 10*3/uL (ref 3.9–10.3)

## 2017-03-14 MED ORDER — IOPAMIDOL (ISOVUE-300) INJECTION 61%
INTRAVENOUS | Status: AC
  Filled 2017-03-14: qty 75

## 2017-03-14 MED ORDER — IOPAMIDOL (ISOVUE-300) INJECTION 61%
75.0000 mL | Freq: Once | INTRAVENOUS | Status: AC | PRN
  Administered 2017-03-14: 75 mL via INTRAVENOUS

## 2017-03-21 ENCOUNTER — Telehealth: Payer: Self-pay | Admitting: Internal Medicine

## 2017-03-21 ENCOUNTER — Ambulatory Visit (HOSPITAL_BASED_OUTPATIENT_CLINIC_OR_DEPARTMENT_OTHER): Payer: Medicare Other | Admitting: Internal Medicine

## 2017-03-21 ENCOUNTER — Encounter: Payer: Self-pay | Admitting: Internal Medicine

## 2017-03-21 DIAGNOSIS — J949 Pleural condition, unspecified: Secondary | ICD-10-CM | POA: Diagnosis not present

## 2017-03-21 DIAGNOSIS — R55 Syncope and collapse: Secondary | ICD-10-CM | POA: Diagnosis not present

## 2017-03-21 DIAGNOSIS — Z85118 Personal history of other malignant neoplasm of bronchus and lung: Secondary | ICD-10-CM

## 2017-03-21 DIAGNOSIS — J90 Pleural effusion, not elsewhere classified: Secondary | ICD-10-CM

## 2017-03-21 DIAGNOSIS — C3490 Malignant neoplasm of unspecified part of unspecified bronchus or lung: Secondary | ICD-10-CM

## 2017-03-21 NOTE — Progress Notes (Signed)
[place] [phone]   [phone]  OFFICE PROGRESS NOTE  [name], MD [address], [place] [address]  DIAGNOSIS: Cancer of upper lobe of lung  Primary site: Lung (Right)  Staging method: AJCC 7th Edition  Clinical: (T1a, N0)  Pathologic: Stage IA (T1a, N0, cM0) signed by [name], MD on 07/09/2013 1:58 PM  Summary: Stage IA (T1a, N0, cM0)  Lung cancer, Right upper lobe  Primary site: Lung (Right)  Pathologic: Stage IB (T2a, N0, cM0) signed by [name], MD on 07/09/2013 1:54 PM  Summary: Stage IB (T2a, N0, cM0)   PRIOR THERAPY:  1) Status post right video-assisted thoracoscopy, wedge resection, right upper lobe with completion of right upper lobectomy with lymph node dissection under the care of Dr. Servando Snare on 07/08/2013. 2) Adjuvant chemotherapy with systemic chemotherapy in the form of cisplatin at 75 mg per meter squared and Alimta 500 mg per meter squared given every 3 weeks for a total of 4 cycles, status post 4 cycles. Last cycle was given 10/23/2013.  CURRENT THERAPY: Observation.   INTERVAL HISTORY: [name] 71 y.o. female returns to the clinic today for follow-up visit accompanied by her husband. The patient is feeling fine today with no specific complaints. She has few syncopal episodes and she is expected to have a Holter monitor placed in the next few weeks. She denied having any chest pain, shortness breath, cough or hemoptysis. She denied having any fever or chills. She has no nausea, vomiting, diarrhea or constipation. The patient had repeat CT scan of the chest performed recently and she is here for evaluation and discussion of her scan results.   MEDICAL HISTORY: Past Medical History:  Diagnosis Date  . Arthritis    back   . Cavitating mass in right upper lung lobe   . CKD (chronic kidney disease) stage 3, GFR 30-59 ml/min 03/12/2015  . Diverticulosis   . Dizziness    occasionally   . Family history of kidney infection    pt history-saw Dr.Wrenn  . GERD (gastroesophageal reflux disease)    takes Nexium daily  . History of colon polyps   . Hyperlipidemia    takes Zocor daily  . Hypertension    takes Amlodipine daily and Chlorthalidone as well  . Lung cancer, Right upper lobe   . Lung nodules    right upper lobe  . Nocturia     ALLERGIES:  is allergic to thiazide-type diuretics.  MEDICATIONS:  Current Outpatient Prescriptions  Medication Sig Dispense Refill  . esomeprazole (NEXIUM) 40 MG capsule Take 40 mg by mouth daily before breakfast.    . folic acid (FOLVITE) 1 MG tablet Take 1 tablet (1 mg total) by mouth daily. 30 tablet 0  . furosemide (LASIX) 20 MG tablet Take 1 tablet (20 mg total) by mouth daily. 15 tablet 0  . guaiFENesin-dextromethorphan (ROBITUSSIN DM) 100-10 MG/5ML syrup Take 5 mLs by mouth every 4 (four) hours as needed for cough. 118 mL 0  . KLOR-CON 10 10 MEQ tablet Take 10 mEq by mouth daily.  4  . LORazepam (ATIVAN) 1 MG tablet Take 1 mg by mouth daily.    Marland Kitchen losartan (COZAAR) 25 MG tablet Take 2 tablets (50 mg total) by mouth daily. 60 tablet 0  . metoprolol succinate (TOPROL-XL) 50 MG 24 hr tablet Take 50 mg by mouth daily. Take with or immediately following a meal.    . Multiple Vitamin (  MULTIVITAMIN WITH MINERALS) TABS tablet Take 1 tablet by mouth daily.    Marland Kitchen rOPINIRole (REQUIP) 0.25 MG tablet Take 0.5 mg by mouth at bedtime.     . simvastatin (ZOCOR) 20 MG tablet Take 20 mg by mouth every evening.    . thiamine 100 MG tablet Take 1 tablet (100 mg total) by mouth daily. 30 tablet 0   No current facility-administered medications for this visit.     SURGICAL HISTORY:  Past Surgical History:  Procedure Laterality Date  . ABDOMINAL HYSTERECTOMY     at age 37;partial   . CARDIOVASCULAR STRESS TEST  06/29/2009   EF 79%, NO ISCHEMIA  . COLONOSCOPY    . TRACHEOSTOMY     at age 70 or 5 d/t polyps on vocal cord  . VIDEO  ASSISTED THORACOSCOPY (VATS)/WEDGE RESECTION Right 07/08/2013   Procedure: VIDEO ASSISTED THORACOSCOPY (VATS)/WEDGE RESECTION;  Surgeon: [name], MD;  Location: [facility];  Service: Thoracic;  Laterality: Right;  Marland Kitchen VIDEO BRONCHOSCOPY N/A 07/08/2013   Procedure: VIDEO BRONCHOSCOPY;  Surgeon: [name], MD;  Location: [facility] OR;  Service: Thoracic;  Laterality: N/A;    REVIEW OF SYSTEMS:  A comprehensive review of systems was negative.   PHYSICAL EXAMINATION: General appearance: alert, cooperative and no distress Head: Normocephalic, without obvious abnormality, atraumatic Neck: no adenopathy, no JVD, supple, symmetrical, trachea midline and thyroid not enlarged, symmetric, no tenderness/mass/nodules Lymph nodes: Cervical, supraclavicular, and axillary nodes normal. Resp: clear to auscultation bilaterally Back: symmetric, no curvature. ROM normal. No CVA tenderness. Cardio: regular rate and rhythm, S1, S2 normal, no murmur, click, rub or gallop GI: soft, non-tender; bowel sounds normal; no masses,  no organomegaly Extremities: extremities normal, atraumatic, no cyanosis or edema  ECOG PERFORMANCE STATUS: 1 - Symptomatic but completely ambulatory  Blood pressure (!) 153/89, pulse 71, temperature 98.8 F (37.1 C), temperature source Oral, resp. rate 18, height 5\' 4"  (1.626 m), weight 154 lb 6.4 oz (70 kg), SpO2 98 %.  LABORATORY DATA: Lab Results  Component Value Date   WBC 5.8 03/14/2017   HGB 12.3 03/14/2017   HCT 37.0 03/14/2017   MCV 92.4 03/14/2017   PLT 331 03/14/2017      Chemistry      Component Value Date/Time   NA 132 (L) 03/14/2017 0831   K 4.5 03/14/2017 0831   CL 91 (L) 05/13/2016 0301   CO2 26 03/14/2017 0831   BUN 13.1 03/14/2017 0831   CREATININE 1.1 03/14/2017 0831      Component Value Date/Time   CALCIUM 9.8 03/14/2017 0831   ALKPHOS 162 (H) 03/14/2017 0831   AST 18 03/14/2017 0831   ALT 13 03/14/2017 0831   BILITOT 0.50 03/14/2017 0831        RADIOGRAPHIC STUDIES: Ct Chest W Contrast  Result Date: 03/14/2017 CLINICAL DATA:  Right lung cancer. Status post right upper lobectomy. EXAM: CT CHEST WITH CONTRAST TECHNIQUE: Multidetector CT imaging of the chest was performed during intravenous contrast administration. CONTRAST:  105mL ISOVUE-300 IOPAMIDOL (ISOVUE-300) INJECTION 61% COMPARISON:  03/15/2016 FINDINGS: Cardiovascular: The heart size is normal. No pericardial effusion. Coronary artery calcification is noted. Atherosclerotic calcification is noted in the wall of the thoracic aorta. Mediastinum/Nodes: No mediastinal lymphadenopathy. No evidence for gross hilar lymphadenopathy although assessment is limited by the lack of intravenous contrast on today's study. The esophagus has normal imaging features. There is no axillary lymphadenopathy. Lungs/Pleura: Volume loss right hemithorax compatible with prior right upper lobectomy. Assessment of the upper lungs is limited  by respiratory motion artifact. Centrilobular emphysema noted. Loculated right pleural fluid collection is new in the interval and there is a 3.4 x 1.2 cm masslike area of lung consolidation posteriorly on the right.Scattered tiny left lung nodules are similar to prior. Upper Abdomen: Cortical scarring noted in both kidneys. Musculoskeletal: Bone windows reveal no worrisome lytic or sclerotic osseous lesions. IMPRESSION: 1. Prominent interval change with development of a posterior rim enhancing and loculated right pleural fluid collection. Immediately adjacent to this pleural collection is a new 3.4 x 1.2 cm focal area of lung consolidation. As recurrent disease is now a concern, PET-CT may prove helpful to further evaluate. 2. Stable appearance scattered tiny left lung nodules. Next item insert 3.  Emphysema. (ICD10-J43.9) 4.  Aortic Atherosclerois (ICD10-170.0) Electronically Signed   By: Misty Stanley M.D.   On: 03/14/2017 12:45   ASSESSMENT AND PLAN: This is a very pleasant 71  years old white female with synchronous non-small cell lung cancer presenting with stage IA as well as a stage IB status post right upper lobectomy with lymph node dissection and currently undergoing adjuvant chemotherapy with cisplatin and Alimta status post 4 cycles completed in March 2015. She has been observation since that time. She had repeat CT scan of the chest performed recently. Unfortunately the scan showed changes in the right lower lobe with loculated right pleural effusion and pleural mass/consolidation concerning for disease recurrence. I personally and independently reviewed the scan images and discuss the results and showed the images to the patient and her husband. I recommended for her to have a PET scan performed for further evaluation of this changes and to rule out disease recurrence. I will see her back for follow-up visit in 2 weeks for evaluation and discussion of the PET scan results and further recommendation regarding her condition. The patient was advised to call immediately if she has any concerning symptoms in the interval. The patient voices understanding of current disease status and treatment options and is in agreement with the current care plan. All questions were answered. The patient knows to call the clinic with any problems, questions or concerns. We can certainly see the patient much sooner if necessary.  Disclaimer: This note was dictated with voice recognition software. Similar sounding words can inadvertently be transcribed and may not be corrected upon review.

## 2017-03-21 NOTE — Telephone Encounter (Signed)
Scheduled appt per 8/14 los - Gave patient AVS and calender per [facility] radiology to contact patient with PET scan ,.

## 2017-03-28 ENCOUNTER — Telehealth: Payer: Self-pay | Admitting: Internal Medicine

## 2017-03-28 ENCOUNTER — Ambulatory Visit: Payer: Medicare Other | Admitting: Cardiothoracic Surgery

## 2017-03-28 NOTE — Telephone Encounter (Signed)
Received request to cancel loop implant.  Procedure cancelled.  Left VM requesting call back to see if Pt wants a f/u appt with Dr. Lovena Le.  Left this nurse name and # for call back.

## 2017-03-28 NOTE — Telephone Encounter (Signed)
New message     Pt needs to cancel appt for 03/30/17, they found a reoccurrence on her cancer scan and they do not want her to have this procedure right now, please call asap

## 2017-03-29 ENCOUNTER — Ambulatory Visit: Payer: Medicare Other | Admitting: Cardiothoracic Surgery

## 2017-03-30 ENCOUNTER — Encounter (HOSPITAL_COMMUNITY): Payer: Self-pay

## 2017-03-30 ENCOUNTER — Ambulatory Visit (HOSPITAL_COMMUNITY): Admit: 2017-03-30 | Payer: Medicare Other | Admitting: Internal Medicine

## 2017-03-30 ENCOUNTER — Ambulatory Visit: Payer: Medicare Other | Admitting: Cardiothoracic Surgery

## 2017-03-30 SURGERY — LOOP RECORDER INSERTION

## 2017-04-03 ENCOUNTER — Encounter (HOSPITAL_COMMUNITY)
Admission: RE | Admit: 2017-04-03 | Discharge: 2017-04-03 | Disposition: A | Discharge status: Home / Self Care | Payer: Medicare Other | Source: Ambulatory Visit | Attending: Internal Medicine | Admitting: Internal Medicine

## 2017-04-03 DIAGNOSIS — C3490 Malignant neoplasm of unspecified part of unspecified bronchus or lung: Secondary | ICD-10-CM | POA: Insufficient documentation

## 2017-04-03 LAB — GLUCOSE, CAPILLARY: Glucose-Capillary: 105 mg/dL — ABNORMAL HIGH (ref 65–99)

## 2017-04-03 MED ORDER — FLUDEOXYGLUCOSE F - 18 (FDG) INJECTION
7.6000 | Freq: Once | INTRAVENOUS | Status: AC | PRN
  Administered 2017-04-03: 7.6 via INTRAVENOUS

## 2017-04-04 NOTE — Telephone Encounter (Signed)
Pt cancelled loop implant d/t reoccurence of breast cancer.  This nurse created 6 month recall to reach out to patient.   Pt aware to call office if any further needs.

## 2017-04-05 ENCOUNTER — Encounter: Payer: Self-pay | Admitting: Internal Medicine

## 2017-04-05 ENCOUNTER — Ambulatory Visit (HOSPITAL_BASED_OUTPATIENT_CLINIC_OR_DEPARTMENT_OTHER): Payer: Medicare Other | Admitting: Internal Medicine

## 2017-04-05 ENCOUNTER — Telehealth: Payer: Self-pay | Admitting: Internal Medicine

## 2017-04-05 VITALS — BP 175/81 | HR 74 | Temp 98.7°F | Resp 18 | Wt 153.6 lb

## 2017-04-05 DIAGNOSIS — J90 Pleural effusion, not elsewhere classified: Secondary | ICD-10-CM | POA: Diagnosis not present

## 2017-04-05 DIAGNOSIS — I119 Hypertensive heart disease without heart failure: Secondary | ICD-10-CM

## 2017-04-05 DIAGNOSIS — C3411 Malignant neoplasm of upper lobe, right bronchus or lung: Secondary | ICD-10-CM

## 2017-04-05 DIAGNOSIS — I10 Essential (primary) hypertension: Secondary | ICD-10-CM | POA: Diagnosis not present

## 2017-04-05 DIAGNOSIS — Z85118 Personal history of other malignant neoplasm of bronchus and lung: Secondary | ICD-10-CM

## 2017-04-05 HISTORY — DX: Pleural effusion, not elsewhere classified: J90

## 2017-04-05 MED ORDER — CLONIDINE HCL 0.1 MG PO TABS
ORAL_TABLET | ORAL | Status: AC
  Filled 2017-04-05: qty 2

## 2017-04-05 MED ORDER — CLONIDINE HCL 0.1 MG PO TABS
0.2000 mg | ORAL_TABLET | Freq: Once | ORAL | Status: AC
  Administered 2017-04-05: 0.2 mg via ORAL

## 2017-04-05 NOTE — Telephone Encounter (Signed)
Gave patient avs and calendar for September - December appointment

## 2017-04-05 NOTE — Progress Notes (Signed)
[place] [phone]) [phone]   Fax:(336) [phone]  OFFICE PROGRESS NOTE  [name], MD [address] [place] [address]  DIAGNOSIS: Cancer of upper lobe of lung  Primary site: Lung (Right)  Staging method: AJCC 7th Edition  Clinical: (T1a, N0)  Pathologic: Stage IA (T1a, N0, cM0) signed by [name], MD on 07/09/2013 1:58 PM  Summary: Stage IA (T1a, N0, cM0)  Lung cancer, Right upper lobe  Primary site: Lung (Right)  Pathologic: Stage IB (T2a, N0, cM0) signed by [name], MD on 07/09/2013 1:54 PM  Summary: Stage IB (T2a, N0, cM0)   PRIOR THERAPY:  1) Status post right video-assisted thoracoscopy, wedge resection, right upper lobe with completion of right upper lobectomy with lymph node dissection under the care of Dr. Servando Snare on 07/08/2013. 2) Adjuvant chemotherapy with systemic chemotherapy in the form of cisplatin at 75 mg per meter squared and Alimta 500 mg per meter squared given every 3 weeks for a total of 4 cycles, status post 4 cycles. Last cycle was given 10/23/2013.  CURRENT THERAPY: Observation.   INTERVAL HISTORY: [name] 71 y.o. female returns to the clinic today for follow-up visit accompanied by her husband. The patient is feeling fine today with no specific complaints except for mild shortness breath with exertion. She denied having any chest pain, cough or hemoptysis. She has no fever or chills. She denied having any weight loss or night sweats. No nausea, vomiting, diarrhea or constipation. She was found on previous CT scan of the chest to have concerning findings for disease recurrence in the right lung. I will ordered a PET scan which was performed recently and the patient is here today for discussion of her scan results and treatment options.  MEDICAL HISTORY: Past Medical History:  Diagnosis Date  . Arthritis    back   . Cavitating mass in right upper lung lobe   . CKD (chronic kidney disease)  stage 3, GFR 30-59 ml/min 03/12/2015  . Diverticulosis   . Dizziness    occasionally   . Family history of kidney infection    pt history-saw Dr.Wrenn  . GERD (gastroesophageal reflux disease)    takes Nexium daily  . History of colon polyps   . Hyperlipidemia    takes Zocor daily  . Hypertension    takes Amlodipine daily and Chlorthalidone as well  . Lung cancer, Right upper lobe   . Lung nodules    right upper lobe  . Nocturia     ALLERGIES:  is allergic to thiazide-type diuretics.  MEDICATIONS:  Current Outpatient Prescriptions  Medication Sig Dispense Refill  . esomeprazole (NEXIUM) 40 MG capsule Take 40 mg by mouth daily before breakfast.    . folic acid (FOLVITE) 1 MG tablet Take 1 tablet (1 mg total) by mouth daily. 30 tablet 0  . furosemide (LASIX) 20 MG tablet Take 1 tablet (20 mg total) by mouth daily. (Patient taking differently: Take 20 mg by mouth 3 (three) times a week. ) 15 tablet 0  . guaiFENesin-dextromethorphan (ROBITUSSIN DM) 100-10 MG/5ML syrup Take 5 mLs by mouth every 4 (four) hours as needed for cough. 118 mL 0  . KLOR-CON 10 10 MEQ tablet Take 10 mEq by mouth 3 (three) times a week.   4  . LORazepam (ATIVAN) 1 MG tablet Take 1 mg by mouth daily.    Marland Kitchen losartan (COZAAR) 25 MG tablet Take 2 tablets (50 mg total) by mouth  daily. 60 tablet 0  . metoprolol succinate (TOPROL-XL) 50 MG 24 hr tablet Take 50 mg by mouth daily. Take with or immediately following a meal.    . Multiple Vitamin (MULTIVITAMIN WITH MINERALS) TABS tablet Take 1 tablet by mouth daily.    Marland Kitchen rOPINIRole (REQUIP) 0.25 MG tablet Take 0.5 mg by mouth at bedtime.     . simvastatin (ZOCOR) 20 MG tablet Take 20 mg by mouth every evening.    . thiamine 100 MG tablet Take 1 tablet (100 mg total) by mouth daily. 30 tablet 0   No current facility-administered medications for this visit.     SURGICAL HISTORY:  Past Surgical History:  Procedure Laterality Date  . ABDOMINAL HYSTERECTOMY     at age  40;partial   . CARDIOVASCULAR STRESS TEST  06/29/2009   EF 79%, NO ISCHEMIA  . COLONOSCOPY    . TRACHEOSTOMY     at age 42 or 5 d/t polyps on vocal cord  . VIDEO ASSISTED THORACOSCOPY (VATS)/WEDGE RESECTION Right 07/08/2013   Procedure: VIDEO ASSISTED THORACOSCOPY (VATS)/WEDGE RESECTION;  Surgeon: [name], MD;  Location: [facility];  Service: Thoracic;  Laterality: Right;  Marland Kitchen VIDEO BRONCHOSCOPY N/A 07/08/2013   Procedure: VIDEO BRONCHOSCOPY;  Surgeon: [name], MD;  Location: [facility] OR;  Service: Thoracic;  Laterality: N/A;    REVIEW OF SYSTEMS:  Constitutional: positive for fatigue Eyes: negative Ears, nose, mouth, throat, and face: negative Respiratory: positive for dyspnea on exertion Cardiovascular: negative Gastrointestinal: negative Genitourinary:negative Integument/breast: negative Hematologic/lymphatic: negative Musculoskeletal:negative Neurological: negative Behavioral/Psych: negative Endocrine: negative Allergic/Immunologic: negative   PHYSICAL EXAMINATION: General appearance: alert, cooperative and no distress Head: Normocephalic, without obvious abnormality, atraumatic Neck: no adenopathy, no JVD, supple, symmetrical, trachea midline and thyroid not enlarged, symmetric, no tenderness/mass/nodules Lymph nodes: Cervical, supraclavicular, and axillary nodes normal. Resp: diminished breath sounds RLL and dullness to percussion RLL Back: symmetric, no curvature. ROM normal. No CVA tenderness. Cardio: regular rate and rhythm, S1, S2 normal, no murmur, click, rub or gallop GI: soft, non-tender; bowel sounds normal; no masses,  no organomegaly Extremities: extremities normal, atraumatic, no cyanosis or edema Neurologic: Alert and oriented X 3, normal strength and tone. Normal symmetric reflexes. Normal coordination and gait  ECOG PERFORMANCE STATUS: 1 - Symptomatic but completely ambulatory  Blood pressure (!) 175/81, pulse 74, temperature 98.7 F (37.1 C),  temperature source Oral, resp. rate 18, weight 153 lb 9.6 oz (69.7 kg), SpO2 100 %.  LABORATORY DATA: Lab Results  Component Value Date   WBC 5.8 03/14/2017   HGB 12.3 03/14/2017   HCT 37.0 03/14/2017   MCV 92.4 03/14/2017   PLT 331 03/14/2017      Chemistry      Component Value Date/Time   NA 132 (L) 03/14/2017 0831   K 4.5 03/14/2017 0831   CL 91 (L) 05/13/2016 0301   CO2 26 03/14/2017 0831   BUN 13.1 03/14/2017 0831   CREATININE 1.1 03/14/2017 0831      Component Value Date/Time   CALCIUM 9.8 03/14/2017 0831   ALKPHOS 162 (H) 03/14/2017 0831   AST 18 03/14/2017 0831   ALT 13 03/14/2017 0831   BILITOT 0.50 03/14/2017 0831       RADIOGRAPHIC STUDIES: Ct Chest W Contrast  Result Date: 03/14/2017 CLINICAL DATA:  Right lung cancer. Status post right upper lobectomy. EXAM: CT CHEST WITH CONTRAST TECHNIQUE: Multidetector CT imaging of the chest was performed during intravenous contrast administration. CONTRAST:  18mL ISOVUE-300 IOPAMIDOL (ISOVUE-300) INJECTION 61% COMPARISON:  03/15/2016 FINDINGS: Cardiovascular: The heart size is normal. No pericardial effusion. Coronary artery calcification is noted. Atherosclerotic calcification is noted in the wall of the thoracic aorta. Mediastinum/Nodes: No mediastinal lymphadenopathy. No evidence for gross hilar lymphadenopathy although assessment is limited by the lack of intravenous contrast on today's study. The esophagus has normal imaging features. There is no axillary lymphadenopathy. Lungs/Pleura: Volume loss right hemithorax compatible with prior right upper lobectomy. Assessment of the upper lungs is limited by respiratory motion artifact. Centrilobular emphysema noted. Loculated right pleural fluid collection is new in the interval and there is a 3.4 x 1.2 cm masslike area of lung consolidation posteriorly on the right.Scattered tiny left lung nodules are similar to prior. Upper Abdomen: Cortical scarring noted in both kidneys.  Musculoskeletal: Bone windows reveal no worrisome lytic or sclerotic osseous lesions. IMPRESSION: 1. Prominent interval change with development of a posterior rim enhancing and loculated right pleural fluid collection. Immediately adjacent to this pleural collection is a new 3.4 x 1.2 cm focal area of lung consolidation. As recurrent disease is now a concern, PET-CT may prove helpful to further evaluate. 2. Stable appearance scattered tiny left lung nodules. Next item insert 3.  Emphysema. (ICD10-J43.9) 4.  Aortic Atherosclerois (ICD10-170.0) Electronically Signed   By: Misty Stanley M.D.   On: 03/14/2017 12:45   Nm Pet Image Restag (ps) Skull Base To Thigh  Result Date: 04/03/2017 CLINICAL DATA:  Subsequent treatment strategy for non small cell lung carcinoma. EXAM: NUCLEAR MEDICINE PET SKULL BASE TO THIGH TECHNIQUE: 7.6 mCi F-18 FDG was injected intravenously. Full-ring PET imaging was performed from the skull base to thigh after the radiotracer. CT data was obtained and used for attenuation correction and anatomic localization. FASTING BLOOD GLUCOSE:  Value: 105 mg/dl COMPARISON:  PET-CT 07/01/2013 FINDINGS: NECK No hypermetabolic lymph nodes in the neck. CHEST Postsurgical change in the RIGHT upper lobe consistent with lobectomy. No evidence of local recurrence in the RIGHT upper lung. Fluid collection in the posterior RIGHT lower lobe with thickened pleural surface with moderate metabolic activity (SUV max equal 3.2). Focus of consolidation in the adjacent RIGHT lower lobe measuring 3.0 x 1.4 cm with mild metabolic activity for size (SUV max equal 2.9). No hypermetabolic mediastinal lymph nodes. ABDOMEN/PELVIS No abnormal hypermetabolic activity within the liver, pancreas, adrenal glands, or spleen. No hypermetabolic lymph nodes in the abdomen or pelvis. SKELETON No focal hypermetabolic activity to suggest skeletal metastasis. Post hysterectomy.  Atherosclerotic calcification of the aorta. IMPRESSION: 1.  Fluid collection in RIGHT lower lobe with thickened parietal and visceral pleural and mild to moderate metabolic activity. Favor empyema or other benign collection over lung cancer recurrence however consider thoracentesis with cytology for further evaluation. 2. Rounded focus consolidation in the RIGHT lower lobe adjacent to the fluid collection with mild to moderate metabolic activity favors rounded atelectasis. 3. No evidence of mediastinal nodal metastasis or distant disease. Electronically Signed   By: Suzy Bouchard M.D.   On: 04/03/2017 09:05   ASSESSMENT AND PLAN: This is a very pleasant 71 years old white female with synchronous non-small cell lung cancer presenting with stage IA as well as a stage IB status post right upper lobectomy with lymph node dissection and currently undergoing adjuvant chemotherapy with cisplatin and Alimta status post 4 cycles completed in March 2015. She has been observation since that time. She had repeat CT scan of the chest performed recently. Unfortunately the scan showed changes in the right lower lobe with loculated right pleural effusion and pleural  mass/consolidation concerning for disease recurrence. She had a recent PET scan that showed the fluid collection in the right lower lobe with thickened parietal and visceral pleura and mild to moderate metabolic activity favoring empyema or benign collection over lung cancer recurrence. I personally and independently reviewed the scan images and discuss the results with the patient and her husband. I recommended for her to see Dr. Servando Snare for evaluation and consideration of drainage of the pleural effusion. I will see her back for follow-up visit in 3 months for reevaluation with repeat CT scan of the chest to confirm resolution of her right pleural effusion. If the pleural fluid was consistent with malignancy, I will see the patient sooner for evaluation and discussion of treatment options. For the hypertension,  the patient has white coat syndrome and her blood pressure is usually within the normal range at home. I give her dose of clonidine 0.2 mg 1 today. I also advised her to monitor her blood pressure closely at home and to report to her primary care physician if it stayed elevated. The patient was advised to call immediately if she has any concerning symptoms in the interval. The patient voices understanding of current disease status and treatment options and is in agreement with the current care plan. All questions were answered. The patient knows to call the clinic with any problems, questions or concerns. We can certainly see the patient much sooner if necessary.  Disclaimer: This note was dictated with voice recognition software. Similar sounding words can inadvertently be transcribed and may not be corrected upon review.

## 2017-04-12 ENCOUNTER — Ambulatory Visit: Payer: Medicare Other

## 2017-04-18 ENCOUNTER — Other Ambulatory Visit: Payer: Self-pay

## 2017-04-18 DIAGNOSIS — J90 Pleural effusion, not elsewhere classified: Secondary | ICD-10-CM

## 2017-04-20 ENCOUNTER — Ambulatory Visit
Admission: RE | Admit: 2017-04-20 | Discharge: 2017-04-20 | Disposition: A | Discharge status: Home / Self Care | Payer: Medicare Other | Source: Ambulatory Visit | Attending: Cardiothoracic Surgery | Admitting: Cardiothoracic Surgery

## 2017-04-20 ENCOUNTER — Encounter: Payer: Self-pay | Admitting: Cardiothoracic Surgery

## 2017-04-20 ENCOUNTER — Ambulatory Visit (INDEPENDENT_AMBULATORY_CARE_PROVIDER_SITE_OTHER): Payer: Medicare Other | Admitting: Cardiothoracic Surgery

## 2017-04-20 ENCOUNTER — Other Ambulatory Visit: Payer: Self-pay | Admitting: *Deleted

## 2017-04-20 VITALS — BP 190/87 | HR 66 | Resp 20 | Ht 64.0 in | Wt 152.0 lb

## 2017-04-20 DIAGNOSIS — J90 Pleural effusion, not elsewhere classified: Secondary | ICD-10-CM

## 2017-04-20 DIAGNOSIS — J869 Pyothorax without fistula: Secondary | ICD-10-CM | POA: Diagnosis not present

## 2017-04-20 NOTE — Progress Notes (Signed)
[address]       [place] [address]             [phone]                     Daveda Ann Belloso [facility] Medical Record #[id number] Date of Birth: 1946-08-03  [name], MD [name], MD  Chief Complaint:   PostOp Follow Up Visit 07/08/2013  OPERATIVE REPORT  PREOPERATIVE DIAGNOSIS: Right upper lobe lung mass.  POSTOPERATIVE DIAGNOSIS: Carcinoma of the right upper lobe.  PROCEDURE PERFORMED: Bronchoscopy, right video-assisted thoracoscopy,  wedge resection, right upper lobe, with completion of right upper  lobectomy, lymph node dissection, and placement of On-Q device.  SURGEON: Lanelle Bal, MD    Adjuvant chemotherapy with systemic chemotherapy in the form of cisplatin at 75 mg per meter squared and Alimta 500 mg per meter squared given every 3 weeks for a total of 4 cycles, status post 4 cycles. Last cycle was given 10/23/2013.   PATH: ALK EGFR negative , [facility]  mRNA testing completed see scanned document Diagnosis 1. 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 - PERINEURAL INVASION IS IDENTIFIED. - THE SURGICAL RESECTION MARGINS ARE NEGATIVE FOR ADENOCARCINOMA. - SEE ONCOLOGY TABLE BELOW. 2. 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 - ATYPICAL ADENOMATOUS HYPERPLASIA. - THERE IS NO EVIDENCE OF CARCINOMA IN 2 OF 2 LYMPH NODES (0/2). - THE SURGICAL RESECTION MARGINS ARE NEGATIVE FOR CARCINOMA. - SEE ONCOLOGY TABLE BELOW. 3. Lymph node, biopsy, 10 R - THERE IS NO EVIDENCE OF CARCINOMA IN 1 OF 1 LYMPH NODE (0/1). 1 of 4 FINAL for Nifong, Leatta ANN [phone]) Diagnosis(continued) 4. Lymph node, biopsy, 11 R - THERE IS NO EVIDENCE OF CARCINOMA IN 1 OF 1 LYMPH NODE (0/1). 5. Lymph node, biopsy, 12 R - THERE IS NO EVIDENCE OF CARCINOMA IN 1 OF 1 LYMPH NODE  (0/1) 6. Lymph node, biopsy, 13 R - THERE IS NO EVIDENCE OF CARCINOMA IN 1 OF 1 LYMPH NODE (0/1). 7. Lymph node, biopsy, 4 R - THERE IS NO EVIDENCE OF CARCINOMA IN 1 OF 1 LYMPH NODE (0/1). Microscopic Comment 1. LUNG (Part 1) Specimen, including laterality: Right upper lobe Procedure: Wedge resection followed by completion lobectomy Specimen integrity (intact/disrupted): Intact Tumor site: Right upper lobe Tumor focality: Adenocarcinoma is unifocal Maximum tumor size (cm): 4.3 cm (gross measurement) Histologic type: Adenocarcinoma. Grade: Well differentiated (low grade Margins: Negative for adenocarcinoma Distance to closest margin (cm): 0.8 cm to the nearest stapled margin (gross measurement) Visceral pleura invasion: Not identified Tumor extension: Confined to lung parenchyma Treatment effect (if treated with neoadjuvant therapy): N/A Lymph -Vascular invasion: Not identified Lymph nodes: Number examined - 7; Number N1 nodes positive 0; Number N2 nodes positive 0 TNM code: pT2a, pN0 Ancillary studies: A block will be sent for EGFR and ALK studies and the results reported separately. Comments: The vast majority of the adenocarcinoma has a lepidic growth pattern (so called in-situ adenocarcinoma). However, there are a few small foci of stromal invasion identified. 2. LUNG (Part 2) Specimen, including laterality: Right upper lobe Procedure: Completion lobectomy Specimen integrity (intact/disrupted): Intact Tumor site: Perihilar Tumor focality: Squamous cell carcinoma is unifocal Maximum tumor size (cm): 1.6 cm (gross measurement) Histologic type: Squamous cell carcinoma Grade: Well differentiated (low grade) Margins: Negative for carcinoma Distance to closest margin (cm):  1.5 cm to the bronchial resection margin (gross measurement) Visceral pleura invasion: Not identified. Tumor extension: Confined to lung parenchyma Treatment effect (if treated with neoadjuvant therapy):  N/A Lymph -Vascular invasion: Not identified Lymph nodes: Number examined - 7; Number N1 nodes positive 0; Number N2 nodes positive 0 TNM code: pT1a, pN0 Ancillary studies: N/A Comments: The squamous cell carcinoma is predominantly in-situ. However, there is a single focus suspicious for early invasion.   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 Following surgery she had systemic chemotherapy in the form of cisplatin at 75 mg per meter squared and Alimta 500 mg per meter squared given every 3 weeks for a total of 4 cycles,  Recent follow-up CT scan showed question of rounded atelectasis right lower lobe with new right pleural effusion. The patient's scans were reviewed at the multidisciplinary thoracic oncology conference.  Zubrod Score: At the time of surgery this patient's most appropriate activity status/level should be described as: [x]    0    Normal activity, no symptoms []    1    Restricted in physical strenuous activity but ambulatory, able to do out light work []    2    Ambulatory and capable of self care, unable to do work activities, up and about >50 % of waking hours                              []    3    Only limited self care, in bed greater than 50% of waking hours []    4    Completely disabled, no self care, confined to bed or chair []    5    Moribund   History  Smoking Status  . Former Smoker  . Types: Cigarettes  . Quit date: 08/08/1978  Smokeless Tobacco  . Never Used    Comment: quit 35 yrs ago       Allergies  Allergen Reactions  . Thiazide-Type Diuretics     Hyponatremia     Current Outpatient Prescriptions  Medication Sig Dispense Refill  . amLODipine (NORVASC) 5 MG tablet Take 5 mg by mouth daily.      . chlorthalidone (HYGROTON) 25 MG tablet Take 25  mg by mouth daily.      . Cholecalciferol (VITAMIN D-3 PO) Take 1 tablet by mouth 2 (two) times a week.       . esomeprazole (NEXIUM) 40 MG capsule Take 40 mg by mouth daily before breakfast.      . folic acid (FOLVITE) 1 MG tablet TAKE 1 TABLET (1 MG TOTAL) BY MOUTH DAILY.  30 tablet  3  . Multiple Vitamins-Minerals (PRESERVISION AREDS PO) Take 1 tablet by mouth daily.       Marland Kitchen oxyCODONE-acetaminophen (PERCOCET/ROXICET) 5-325 MG per tablet Take 1-2 tablets by mouth every 8 (eight) hours as needed for severe pain.  30 tablet  0  . simvastatin (ZOCOR) 20 MG tablet Take 20 mg by mouth every evening.           Physical Exam: BP (!) 190/87   Pulse 66   Resp 20   Ht 5' 4" (1.626 m)   Wt 152 lb (68.9 kg)   SpO2 96%  Comment: RA  BMI 26.09 kg/m   General appearance: alert and cooperative Neurologic: intact Heart: regular rate and rhythm, S1, S2 normal, no murmur, click, rub or gallop Lungs: diminished breath sounds RLL Abdomen: soft, non-tender; bowel sounds normal; no masses,  no organomegaly Extremities: extremities normal, atraumatic, no cyanosis or edema and Homans sign is negative, no sign of DVT Wound: Right chest port sites/incisions healing well, sutures removed No cervical supraclavicular adenopathy  Diagnostic Studies & Laboratory data:         Recent Radiology Findings: Dg Chest 2 View  Result Date: 04/20/2017 CLINICAL DATA:  Hx VATS 07/2013, lung cancer, recent abnormal PET scan, sob, no smoke EXAM: CHEST  2 VIEW COMPARISON:  04/03/2017 FINDINGS: The heart size is normal. There is a right pleural effusion, associated with stable right basilar opacity. Left lung is clear. No pulmonary edema. IMPRESSION: Stable appearance of the chest. Electronically Signed   By: Nolon Nations M.D.   On: 04/20/2017 12:18   Nm Pet Image Restag (ps) Skull Base To Thigh  Result Date: 04/03/2017 CLINICAL DATA:  Subsequent treatment strategy for non small cell lung carcinoma. EXAM: NUCLEAR  MEDICINE PET SKULL BASE TO THIGH TECHNIQUE: 7.6 mCi F-18 FDG was injected intravenously. Full-ring PET imaging was performed from the skull base to thigh after the radiotracer. CT data was obtained and used for attenuation correction and anatomic localization. FASTING BLOOD GLUCOSE:  Value: 105 mg/dl COMPARISON:  PET-CT 07/01/2013 FINDINGS: NECK No hypermetabolic lymph nodes in the neck. CHEST Postsurgical change in the RIGHT upper lobe consistent with lobectomy. No evidence of local recurrence in the RIGHT upper lung. Fluid collection in the posterior RIGHT lower lobe with thickened pleural surface with moderate metabolic activity (SUV max equal 3.2). Focus of consolidation in the adjacent RIGHT lower lobe measuring 3.0 x 1.4 cm with mild metabolic activity for size (SUV max equal 2.9). No hypermetabolic mediastinal lymph nodes. ABDOMEN/PELVIS No abnormal hypermetabolic activity within the liver, pancreas, adrenal glands, or spleen. No hypermetabolic lymph nodes in the abdomen or pelvis. SKELETON No focal hypermetabolic activity to suggest skeletal metastasis. Post hysterectomy.  Atherosclerotic calcification of the aorta. IMPRESSION: 1. Fluid collection in RIGHT lower lobe with thickened parietal and visceral pleural and mild to moderate metabolic activity. Favor empyema or other benign collection over lung cancer recurrence however consider thoracentesis with cytology for further evaluation. 2. Rounded focus consolidation in the RIGHT lower lobe adjacent to the fluid collection with mild to moderate metabolic activity favors rounded atelectasis. 3. No evidence of mediastinal nodal metastasis or distant disease. Electronically Signed   By: Suzy Bouchard M.D.   On: 04/03/2017 09:05   Ct Chest W Contrast  03/05/2015   CLINICAL DATA:  Right upper lobe lung cancer diagnosed December 2014. Chemotherapy completed March 2015. Right upper lobectomy. Restaging.  EXAM: CT CHEST WITH CONTRAST  TECHNIQUE: Multidetector  CT imaging of the chest was performed during intravenous contrast administration.  CONTRAST:  71m OMNIPAQUE IOHEXOL 300 MG/ML  SOLN  COMPARISON:  08/28/2014  FINDINGS: Mediastinum/Nodes: Aortic and branch vessel atherosclerotic calcification noted. No pathologic adenopathy.  Lungs/Pleura: Right upper lobectomy. Mild findings of centrilobular emphysema.  Sub solid 4 by 5 mm nodule in the left lower lobe, image 41 series 5, no change from 06/19/2013. 4 mm sub solid nodule in the left lower lobe on image 39 series 5, no change from 06/19/2013. 3 mm ground-glass nodular density in the left upper lobe, no change from 06/19/2013.  Upper abdomen: Unremarkable  Musculoskeletal: Levoconvex  lumbar scoliosis is partially imaged, with rotary component.  IMPRESSION: 1. No recurrent malignancy identified. 2. Several tiny sub solid left pulmonary nodules are unchanged the earliest comparable reference exam from 06/19/2013, and highly likely to be benign. These may merit attention on followup studies. 3. Atherosclerosis. 4. Levoconvex lumbar scoliosis.   Electronically Signed   By: Van Clines M.D.   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 This will be done in the next week and I'll plan to see her back following the completion.   [name] MD      [address] [place] [address] Office [phone]   Beeper [phone]

## 2017-04-24 ENCOUNTER — Ambulatory Visit (HOSPITAL_COMMUNITY): Admission: RE | Admit: 2017-04-24 | Payer: Medicare Other | Source: Ambulatory Visit

## 2017-04-25 ENCOUNTER — Ambulatory Visit (HOSPITAL_COMMUNITY)
Admission: RE | Admit: 2017-04-25 | Discharge: 2017-04-25 | Disposition: A | Discharge status: Home / Self Care | Payer: Medicare Other | Source: Ambulatory Visit | Attending: Student | Admitting: Student

## 2017-04-25 ENCOUNTER — Ambulatory Visit (HOSPITAL_COMMUNITY)
Admission: RE | Admit: 2017-04-25 | Discharge: 2017-04-25 | Disposition: A | Discharge status: Home / Self Care | Payer: Medicare Other | Source: Ambulatory Visit | Attending: Cardiothoracic Surgery | Admitting: Cardiothoracic Surgery

## 2017-04-25 ENCOUNTER — Other Ambulatory Visit: Payer: Self-pay | Admitting: *Deleted

## 2017-04-25 ENCOUNTER — Encounter (HOSPITAL_COMMUNITY): Payer: Self-pay | Admitting: Student

## 2017-04-25 DIAGNOSIS — J90 Pleural effusion, not elsewhere classified: Secondary | ICD-10-CM | POA: Insufficient documentation

## 2017-04-25 DIAGNOSIS — Z9889 Other specified postprocedural states: Secondary | ICD-10-CM

## 2017-04-25 HISTORY — PX: IR THORACENTESIS ASP PLEURAL SPACE W/IMG GUIDE: IMG5380

## 2017-04-25 MED ORDER — LIDOCAINE HCL (PF) 1 % IJ SOLN
INTRAMUSCULAR | Status: AC
  Filled 2017-04-25: qty 30

## 2017-04-25 NOTE — Procedures (Addendum)
PROCEDURE SUMMARY:  Successful US guided right diagnostic and therapeutic thoracentesis. Yielded 25 mL of turbid amber fluid. Pt tolerated procedure well. No immediate complications.  Specimen was sent for labs. CXR ordered.  Docia Barrier PA-C 04/25/2017 2:00 PM

## 2017-04-26 LAB — ACID FAST SMEAR (AFB, MYCOBACTERIA): Acid Fast Smear: NEGATIVE

## 2017-04-28 LAB — BODY FLUID CULTURE: Culture: NO GROWTH

## 2017-05-04 ENCOUNTER — Ambulatory Visit: Payer: Medicare Other | Admitting: Cardiothoracic Surgery

## 2017-05-25 ENCOUNTER — Encounter: Payer: Medicare Other | Admitting: Cardiothoracic Surgery

## 2017-05-26 LAB — FUNGUS CULTURE WITH STAIN

## 2017-05-26 LAB — FUNGUS CULTURE RESULT

## 2017-05-26 LAB — FUNGAL ORGANISM REFLEX

## 2017-05-31 ENCOUNTER — Other Ambulatory Visit: Payer: Self-pay | Admitting: Cardiothoracic Surgery

## 2017-05-31 DIAGNOSIS — J90 Pleural effusion, not elsewhere classified: Secondary | ICD-10-CM

## 2017-06-01 ENCOUNTER — Ambulatory Visit (INDEPENDENT_AMBULATORY_CARE_PROVIDER_SITE_OTHER): Payer: Medicare Other | Admitting: Cardiothoracic Surgery

## 2017-06-01 ENCOUNTER — Encounter: Payer: Self-pay | Admitting: Cardiothoracic Surgery

## 2017-06-01 ENCOUNTER — Ambulatory Visit
Admission: RE | Admit: 2017-06-01 | Discharge: 2017-06-01 | Disposition: A | Discharge status: Home / Self Care | Payer: Medicare Other | Source: Ambulatory Visit | Attending: Cardiothoracic Surgery | Admitting: Cardiothoracic Surgery

## 2017-06-01 VITALS — BP 139/76 | HR 66 | Ht 64.0 in | Wt 150.0 lb

## 2017-06-01 DIAGNOSIS — C3411 Malignant neoplasm of upper lobe, right bronchus or lung: Secondary | ICD-10-CM | POA: Diagnosis not present

## 2017-06-01 DIAGNOSIS — J90 Pleural effusion, not elsewhere classified: Secondary | ICD-10-CM

## 2017-06-01 DIAGNOSIS — Z902 Acquired absence of lung [part of]: Secondary | ICD-10-CM

## 2017-06-01 NOTE — Progress Notes (Signed)
[address]       [place] [address]             [phone]                     Daveda Ann Belloso [facility] Medical Record #[id number] Date of Birth: 1946-08-03  [name], MD [name], MD  Chief Complaint:   PostOp Follow Up Visit 07/08/2013  OPERATIVE REPORT  PREOPERATIVE DIAGNOSIS: Right upper lobe lung mass.  POSTOPERATIVE DIAGNOSIS: Carcinoma of the right upper lobe.  PROCEDURE PERFORMED: Bronchoscopy, right video-assisted thoracoscopy,  wedge resection, right upper lobe, with completion of right upper  lobectomy, lymph node dissection, and placement of On-Q device.  SURGEON: Lanelle Bal, MD    Adjuvant chemotherapy with systemic chemotherapy in the form of cisplatin at 75 mg per meter squared and Alimta 500 mg per meter squared given every 3 weeks for a total of 4 cycles, status post 4 cycles. Last cycle was given 10/23/2013.   PATH: ALK EGFR negative , [facility]  mRNA testing completed see scanned document Diagnosis 1. 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 - PERINEURAL INVASION IS IDENTIFIED. - THE SURGICAL RESECTION MARGINS ARE NEGATIVE FOR ADENOCARCINOMA. - SEE ONCOLOGY TABLE BELOW. 2. 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 - ATYPICAL ADENOMATOUS HYPERPLASIA. - THERE IS NO EVIDENCE OF CARCINOMA IN 2 OF 2 LYMPH NODES (0/2). - THE SURGICAL RESECTION MARGINS ARE NEGATIVE FOR CARCINOMA. - SEE ONCOLOGY TABLE BELOW. 3. Lymph node, biopsy, 10 R - THERE IS NO EVIDENCE OF CARCINOMA IN 1 OF 1 LYMPH NODE (0/1). 1 of 4 FINAL for Nifong, Leatta ANN [phone]) Diagnosis(continued) 4. Lymph node, biopsy, 11 R - THERE IS NO EVIDENCE OF CARCINOMA IN 1 OF 1 LYMPH NODE (0/1). 5. Lymph node, biopsy, 12 R - THERE IS NO EVIDENCE OF CARCINOMA IN 1 OF 1 LYMPH NODE  (0/1) 6. Lymph node, biopsy, 13 R - THERE IS NO EVIDENCE OF CARCINOMA IN 1 OF 1 LYMPH NODE (0/1). 7. Lymph node, biopsy, 4 R - THERE IS NO EVIDENCE OF CARCINOMA IN 1 OF 1 LYMPH NODE (0/1). Microscopic Comment 1. LUNG (Part 1) Specimen, including laterality: Right upper lobe Procedure: Wedge resection followed by completion lobectomy Specimen integrity (intact/disrupted): Intact Tumor site: Right upper lobe Tumor focality: Adenocarcinoma is unifocal Maximum tumor size (cm): 4.3 cm (gross measurement) Histologic type: Adenocarcinoma. Grade: Well differentiated (low grade Margins: Negative for adenocarcinoma Distance to closest margin (cm): 0.8 cm to the nearest stapled margin (gross measurement) Visceral pleura invasion: Not identified Tumor extension: Confined to lung parenchyma Treatment effect (if treated with neoadjuvant therapy): N/A Lymph -Vascular invasion: Not identified Lymph nodes: Number examined - 7; Number N1 nodes positive 0; Number N2 nodes positive 0 TNM code: pT2a, pN0 Ancillary studies: A block will be sent for EGFR and ALK studies and the results reported separately. Comments: The vast majority of the adenocarcinoma has a lepidic growth pattern (so called in-situ adenocarcinoma). However, there are a few small foci of stromal invasion identified. 2. LUNG (Part 2) Specimen, including laterality: Right upper lobe Procedure: Completion lobectomy Specimen integrity (intact/disrupted): Intact Tumor site: Perihilar Tumor focality: Squamous cell carcinoma is unifocal Maximum tumor size (cm): 1.6 cm (gross measurement) Histologic type: Squamous cell carcinoma Grade: Well differentiated (low grade) Margins: Negative for carcinoma Distance to closest margin (cm):  1.5 cm to the bronchial resection margin (gross measurement) Visceral pleura invasion: Not identified. Tumor extension: Confined to lung parenchyma Treatment effect (if treated with neoadjuvant therapy):  N/A Lymph -Vascular invasion: Not identified Lymph nodes: Number examined - 7; Number N1 nodes positive 0; Number N2 nodes positive 0 TNM code: pT1a, pN0 Ancillary studies: N/A Comments: The squamous cell carcinoma is predominantly in-situ. However, there is a single focus suspicious for early invasion.   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 Following surgery she had systemic chemotherapy in the form of cisplatin at 75 mg per meter squared and Alimta 500 mg per meter squared given every 3 weeks for a total of 4 cycles,  Recent follow-up CT scan showed question of rounded atelectasis right lower lobe with new right pleural effusion. The patient's scans were reviewed at the multidisciplinary thoracic oncology conference.  A ultrasound directed needle aspiration of the right pleural fluid was performed, cytologies were negative.   Patient returns today with a follow-up chest x-ray.  Since last seen she has had no new symptoms does have occasional dry cough no fever chills or evidence of infection.   Zubrod Score: At the time of surgery this patient's most appropriate activity status/level should be described as: '[x]'$     0    Normal activity, no symptoms '[]'$     1    Restricted in physical strenuous activity but ambulatory, able to do out light work '[]'$     2    Ambulatory and capable of self care, unable to do work activities, up and about >50 % of waking hours                              '[]'$     3    Only limited self care, in bed greater than 50% of waking hours '[]'$     4    Completely disabled, no self care, confined to bed or chair '[]'$     5    Moribund   History  Smoking Status  . Former Smoker  . Types: Cigarettes  . Quit date: 08/08/1978  Smokeless Tobacco  . Never Used    Comment: quit 35 yrs  ago       Allergies  Allergen Reactions  . Thiazide-Type Diuretics     Hyponatremia     Current Outpatient Prescriptions  Medication Sig Dispense Refill  . amLODipine (NORVASC) 5 MG tablet Take 5 mg by mouth daily.      . chlorthalidone (HYGROTON) 25 MG tablet Take 25 mg by mouth daily.      . Cholecalciferol (VITAMIN D-3 PO) Take 1 tablet by mouth 2 (two) times a week.       . esomeprazole (NEXIUM) 40 MG capsule Take 40 mg by mouth daily before breakfast.      . folic acid (FOLVITE) 1 MG tablet TAKE 1 TABLET (1 MG TOTAL) BY MOUTH DAILY.  30 tablet  3  . Multiple Vitamins-Minerals (PRESERVISION AREDS PO) Take 1 tablet by mouth daily.       Marland Kitchen oxyCODONE-acetaminophen (PERCOCET/ROXICET) 5-325 MG per tablet Take 1-2 tablets by mouth every 8 (eight) hours as needed for severe pain.  30 tablet  0  . simvastatin (ZOCOR) 20 MG  tablet Take 20 mg by mouth every evening.           Physical Exam: BP 139/76   Pulse 66   Ht '5\' 4"'$  (1.626 m)   Wt 150 lb (68 kg)   SpO2 99%   BMI 25.75 kg/m   General appearance: alert and cooperative Neurologic: intact Heart: regular rate and rhythm, S1, S2 normal, no murmur, click, rub or gallop Lungs: diminished breath sounds RLL Abdomen: soft, non-tender; bowel sounds normal; no masses,  no organomegaly Extremities: extremities normal, atraumatic, no cyanosis or edema and Homans sign is negative, no sign of DVT Wound: Right chest port sites/incisions healing well, sutures removed No cervical supraclavicular adenopathy     Diagnostic Studies & Laboratory data:         Recent Radiology Findings:   Dg Chest 2 View  Result Date: 06/01/2017 CLINICAL DATA:  History of right-sided pleural effusion, followup, history of right lung carcinoma EXAM: CHEST  2 VIEW COMPARISON:  Chest x-ray of 04/25/2017 and 04/20/2017 FINDINGS: There is little change in pleural and parenchymal opacity at the right lung base some of which could represent loculated right  pleural effusion posteriorly. This apparent loculated effusion has not changed significantly in volume in the interval. Otherwise the lungs are well aerated. No lung nodule is seen. Mediastinal and hilar contours are unremarkable. The heart is within normal limits in size. No acute bony abnormality is seen. IMPRESSION: No change in probably loculated right pleural effusion posteriorly. Electronically Signed   By: Ivar Drape M.D.   On: 06/01/2017 11:42   I have independently reviewed the above radiology studies  and reviewed the findings with the patient.   Study Result   CLINICAL DATA:  Right lung cancer. Status post right upper lobectomy.  EXAM: CT CHEST WITH CONTRAST  TECHNIQUE: Multidetector CT imaging of the chest was performed during intravenous contrast administration.  CONTRAST:  57m ISOVUE-300 IOPAMIDOL (ISOVUE-300) INJECTION 61%  COMPARISON:  03/15/2016  FINDINGS: Cardiovascular: The heart size is normal. No pericardial effusion. Coronary artery calcification is noted. Atherosclerotic calcification is noted in the wall of the thoracic aorta.  Mediastinum/Nodes: No mediastinal lymphadenopathy. No evidence for gross hilar lymphadenopathy although assessment is limited by the lack of intravenous contrast on today's study. The esophagus has normal imaging features. There is no axillary lymphadenopathy.  Lungs/Pleura: Volume loss right hemithorax compatible with prior right upper lobectomy. Assessment of the upper lungs is limited by respiratory motion artifact. Centrilobular emphysema noted. Loculated right pleural fluid collection is new in the interval and there is a 3.4 x 1.2 cm masslike area of lung consolidation posteriorly on the right.Scattered tiny left lung nodules are similar to prior.  Upper Abdomen: Cortical scarring noted in both kidneys.  Musculoskeletal: Bone windows reveal no worrisome lytic or sclerotic osseous lesions.  IMPRESSION: 1.  Prominent interval change with development of a posterior rim enhancing and loculated right pleural fluid collection. Immediately adjacent to this pleural collection is a new 3.4 x 1.2 cm focal area of lung consolidation. As recurrent disease is now a concern, PET-CT may prove helpful to further evaluate. 2. Stable appearance scattered tiny left lung nodules. Next item insert 3.  Emphysema. (ICD10-J43.9) 4.  Aortic Atherosclerois (ICD10-170.0)   Electronically Signed   By: EMisty StanleyM.D.   On: 03/14/2017 12:45   Study Result   CLINICAL DATA:  Subsequent treatment strategy for non small cell lung carcinoma.  EXAM: NUCLEAR MEDICINE PET SKULL BASE TO THIGH  TECHNIQUE: 7.6 mCi  F-18 FDG was injected intravenously. Full-ring PET imaging was performed from the skull base to thigh after the radiotracer. CT data was obtained and used for attenuation correction and anatomic localization.  FASTING BLOOD GLUCOSE:  Value: 105 mg/dl  COMPARISON:  PET-CT 07/01/2013  FINDINGS: NECK  No hypermetabolic lymph nodes in the neck.  CHEST  Postsurgical change in the RIGHT upper lobe consistent with lobectomy. No evidence of local recurrence in the RIGHT upper lung.  Fluid collection in the posterior RIGHT lower lobe with thickened pleural surface with moderate metabolic activity (SUV max equal 3.2). Focus of consolidation in the adjacent RIGHT lower lobe measuring 3.0 x 1.4 cm with mild metabolic activity for size (SUV max equal 2.9).  No hypermetabolic mediastinal lymph nodes.  ABDOMEN/PELVIS  No abnormal hypermetabolic activity within the liver, pancreas, adrenal glands, or spleen. No hypermetabolic lymph nodes in the abdomen or pelvis.  SKELETON  No focal hypermetabolic activity to suggest skeletal metastasis.  Post hysterectomy.  Atherosclerotic calcification of the aorta.  IMPRESSION: 1. Fluid collection in RIGHT lower lobe with thickened parietal  and visceral pleural and mild to moderate metabolic activity. Favor empyema or other benign collection over lung cancer recurrence however consider thoracentesis with cytology for further evaluation. 2. Rounded focus consolidation in the RIGHT lower lobe adjacent to the fluid collection with mild to moderate metabolic activity favors rounded atelectasis. 3. No evidence of mediastinal nodal metastasis or distant disease.   Electronically Signed   By: Suzy Bouchard M.D.   On: 04/03/2017 09:05      Ct Chest W Contrast  03/05/2015   CLINICAL DATA:  Right upper lobe lung cancer diagnosed December 2014. Chemotherapy completed March 2015. Right upper lobectomy. Restaging.  EXAM: CT CHEST WITH CONTRAST  TECHNIQUE: Multidetector CT imaging of the chest was performed during intravenous contrast administration.  CONTRAST:  31m OMNIPAQUE IOHEXOL 300 MG/ML  SOLN  COMPARISON:  08/28/2014  FINDINGS: Mediastinum/Nodes: Aortic and branch vessel atherosclerotic calcification noted. No pathologic adenopathy.  Lungs/Pleura: Right upper lobectomy. Mild findings of centrilobular emphysema.  Sub solid 4 by 5 mm nodule in the left lower lobe, image 41 series 5, no change from 06/19/2013. 4 mm sub solid nodule in the left lower lobe on image 39 series 5, no change from 06/19/2013. 3 mm ground-glass nodular density in the left upper lobe, no change from 06/19/2013.  Upper abdomen: Unremarkable  Musculoskeletal: Levoconvex lumbar scoliosis is partially imaged, with rotary component.  IMPRESSION: 1. No recurrent malignancy identified. 2. Several tiny sub solid left pulmonary nodules are unchanged the earliest comparable reference exam from 06/19/2013, and highly likely to be benign. These may merit attention on followup studies. 3. Atherosclerosis. 4. Levoconvex lumbar scoliosis.   Electronically Signed   By: WVan ClinesM.D.   On: 03/05/2015 09:31    Recent Labs: Lab Results  Component Value Date   WBC  5.8 03/14/2017   HGB 12.3 03/14/2017   HCT 37.0 03/14/2017   PLT 331 03/14/2017   GLUCOSE 104 03/14/2017   ALT 13 03/14/2017   AST 18 03/14/2017   NA 132 (L) 03/14/2017   K 4.5 03/14/2017   CL 91 (L) 05/13/2016   CREATININE 1.1 03/14/2017   BUN 13.1 03/14/2017   CO2 26 03/14/2017   INR 0.89 07/03/2013   Adequacy Reason Satisfactory For Evaluation. Diagnosis PLEURAL FLUID, RIGHT (SPECIMEN 1 OF 1 COLLECTED 04-25-2017) NO MALIGNANT CELLS IDENTIFIED. JEnid CutterMD Pathologist, Electronic Signature (Case signed 04/27/2017)  Assessment / Plan:   Negative  thoracentesis for loculated right small pleural effusion.  Unchanged on x-ray.  I reviewed the films with the patient recommend that we obtain a follow-up CT scan approximately 4 months after the one done in August, tentatively plan to see her back in December with CT of the chest  [name] MD      [address] [place] [address] Office [phone]   [place] [phone]

## 2017-06-07 LAB — ACID FAST CULTURE WITH REFLEXED SENSITIVITIES (MYCOBACTERIA): Acid Fast Culture: NEGATIVE

## 2017-06-28 ENCOUNTER — Other Ambulatory Visit: Payer: Self-pay | Admitting: *Deleted

## 2017-06-28 DIAGNOSIS — Z85118 Personal history of other malignant neoplasm of bronchus and lung: Secondary | ICD-10-CM

## 2017-06-28 DIAGNOSIS — J90 Pleural effusion, not elsewhere classified: Secondary | ICD-10-CM

## 2017-07-06 ENCOUNTER — Ambulatory Visit (HOSPITAL_COMMUNITY)
Admission: RE | Admit: 2017-07-06 | Discharge: 2017-07-06 | Disposition: A | Discharge status: Home / Self Care | Payer: Medicare Other | Source: Ambulatory Visit | Attending: Internal Medicine | Admitting: Internal Medicine

## 2017-07-06 ENCOUNTER — Other Ambulatory Visit (HOSPITAL_BASED_OUTPATIENT_CLINIC_OR_DEPARTMENT_OTHER): Payer: Medicare Other

## 2017-07-06 DIAGNOSIS — C3411 Malignant neoplasm of upper lobe, right bronchus or lung: Secondary | ICD-10-CM | POA: Insufficient documentation

## 2017-07-06 DIAGNOSIS — I119 Hypertensive heart disease without heart failure: Secondary | ICD-10-CM

## 2017-07-06 DIAGNOSIS — I700 Atherosclerosis of aorta: Secondary | ICD-10-CM | POA: Diagnosis not present

## 2017-07-06 DIAGNOSIS — J90 Pleural effusion, not elsewhere classified: Secondary | ICD-10-CM

## 2017-07-06 DIAGNOSIS — J439 Emphysema, unspecified: Secondary | ICD-10-CM | POA: Diagnosis not present

## 2017-07-06 LAB — CBC WITH DIFFERENTIAL/PLATELET
BASO%: 0.3 % (ref 0.0–2.0)
Basophils Absolute: 0 10*3/uL (ref 0.0–0.1)
EOS%: 5.2 % (ref 0.0–7.0)
Eosinophils Absolute: 0.4 10*3/uL (ref 0.0–0.5)
HCT: 38.8 % (ref 34.8–46.6)
HGB: 13.3 g/dL (ref 11.6–15.9)
LYMPH%: 34.3 % (ref 14.0–49.7)
MCH: 32 pg (ref 25.1–34.0)
MCHC: 34.3 g/dL (ref 31.5–36.0)
MCV: 93.3 fL (ref 79.5–101.0)
MONO#: 0.7 10*3/uL (ref 0.1–0.9)
MONO%: 9.6 % (ref 0.0–14.0)
NEUT#: 3.4 10*3/uL (ref 1.5–6.5)
NEUT%: 50.6 % (ref 38.4–76.8)
PLATELETS: 227 10*3/uL (ref 145–400)
RBC: 4.16 10*6/uL (ref 3.70–5.45)
RDW: 13.6 % (ref 11.2–14.5)
WBC: 6.8 10*3/uL (ref 3.9–10.3)
lymph#: 2.3 10*3/uL (ref 0.9–3.3)

## 2017-07-06 LAB — COMPREHENSIVE METABOLIC PANEL
ALBUMIN: 4.1 g/dL (ref 3.5–5.0)
ALK PHOS: 136 U/L (ref 40–150)
ALT: 14 U/L (ref 0–55)
AST: 23 U/L (ref 5–34)
Anion Gap: 9 mEq/L (ref 3–11)
BUN: 14.8 mg/dL (ref 7.0–26.0)
CHLORIDE: 96 meq/L — AB (ref 98–109)
CO2: 25 mEq/L (ref 22–29)
CREATININE: 1.2 mg/dL — AB (ref 0.6–1.1)
Calcium: 9.5 mg/dL (ref 8.4–10.4)
EGFR: 45 mL/min/{1.73_m2} — ABNORMAL LOW (ref >60)
Glucose: 96 mg/dl (ref 70–140)
POTASSIUM: 4.3 meq/L (ref 3.5–5.1)
Sodium: 130 mEq/L — ABNORMAL LOW (ref 136–145)
Total Bilirubin: 0.51 mg/dL (ref 0.20–1.20)
Total Protein: 7 g/dL (ref 6.4–8.3)

## 2017-07-06 MED ORDER — IOPAMIDOL (ISOVUE-300) INJECTION 61%
INTRAVENOUS | Status: AC
Start: 2017-07-06 — End: 2017-07-06
  Filled 2017-07-06: qty 75

## 2017-07-06 MED ORDER — IOPAMIDOL (ISOVUE-300) INJECTION 61%
60.0000 mL | Freq: Once | INTRAVENOUS | Status: AC | PRN
  Administered 2017-07-06: 60 mL via INTRAVENOUS

## 2017-07-13 ENCOUNTER — Ambulatory Visit (HOSPITAL_BASED_OUTPATIENT_CLINIC_OR_DEPARTMENT_OTHER): Payer: Medicare Other | Admitting: Internal Medicine

## 2017-07-13 ENCOUNTER — Encounter: Payer: Self-pay | Admitting: Internal Medicine

## 2017-07-13 ENCOUNTER — Telehealth: Payer: Self-pay | Admitting: Internal Medicine

## 2017-07-13 DIAGNOSIS — C3411 Malignant neoplasm of upper lobe, right bronchus or lung: Secondary | ICD-10-CM | POA: Diagnosis present

## 2017-07-13 DIAGNOSIS — I10 Essential (primary) hypertension: Secondary | ICD-10-CM

## 2017-07-13 DIAGNOSIS — C3490 Malignant neoplasm of unspecified part of unspecified bronchus or lung: Secondary | ICD-10-CM

## 2017-07-13 NOTE — Progress Notes (Signed)
[facility] [phone]   Fax:(336) [phone]  OFFICE PROGRESS NOTE  [name], MD [address] [place] [address]  DIAGNOSIS: Cancer of upper lobe of lung  Primary site: Lung (Right)  Staging method: AJCC 7th Edition  Clinical: (T1a, N0)  Pathologic: Stage IA (T1a, N0, cM0) signed by [name], MD on 07/09/2013 1:58 PM  Summary: Stage IA (T1a, N0, cM0)  Lung cancer, Right upper lobe  Primary site: Lung (Right)  Pathologic: Stage IB (T2a, N0, cM0) signed by [name], MD on 07/09/2013 1:54 PM  Summary: Stage IB (T2a, N0, cM0)   PRIOR THERAPY:  1) Status post right video-assisted thoracoscopy, wedge resection, right upper lobe with completion of right upper lobectomy with lymph node dissection under the care of Dr. Servando Snare on 07/08/2013. 2) Adjuvant chemotherapy with systemic chemotherapy in the form of cisplatin at 75 mg per meter squared and Alimta 500 mg per meter squared given every 3 weeks for a total of 4 cycles, status post 4 cycles. Last cycle was given 10/23/2013.  CURRENT THERAPY: Observation.   INTERVAL HISTORY: [name] 71 y.o. female returns to the clinic today for follow-up visit accompanied by her husband.  The patient is feeling fine today with no specific complaints.  She denied having any chest pain or shortness of breath but continues to have dry cough.  She has no hemoptysis.  She denied having any recent weight loss or night sweats.  She has no nausea, vomiting, diarrhea or constipation.  She had a repeat CT scan of the chest performed recently and she is here for evaluation and discussion of her discuss results.  MEDICAL HISTORY: Past Medical History:  Diagnosis Date  . Arthritis    back   . Cavitating mass in right upper lung lobe   . CKD (chronic kidney disease) stage 3, GFR 30-59 ml/min (HCC) 03/12/2015  . Diverticulosis   . Dizziness    occasionally   . Family history of kidney  infection    pt history-saw Dr.Wrenn  . GERD (gastroesophageal reflux disease)    takes Nexium daily  . History of colon polyps   . Hyperlipidemia    takes Zocor daily  . Hypertension    takes Amlodipine daily and Chlorthalidone as well  . Lung cancer, Right upper lobe   . Lung nodules    right upper lobe  . Nocturia   . Pleural effusion on right 04/05/2017    ALLERGIES:  is allergic to thiazide-type diuretics.  MEDICATIONS:  Current Outpatient Medications  Medication Sig Dispense Refill  . esomeprazole (NEXIUM) 40 MG capsule Take 40 mg by mouth daily before breakfast.    . furosemide (LASIX) 20 MG tablet Take 1 tablet (20 mg total) by mouth daily. (Patient taking differently: Take 20 mg by mouth 3 (three) times a week. ) 15 tablet 0  . KLOR-CON 10 10 MEQ tablet Take 10 mEq by mouth 3 (three) times a week.   4  . LORazepam (ATIVAN) 1 MG tablet Take 1 mg by mouth daily.    Marland Kitchen losartan (COZAAR) 25 MG tablet Take 2 tablets (50 mg total) by mouth daily. 60 tablet 0  . metoprolol succinate (TOPROL-XL) 50 MG 24 hr tablet Take 50 mg by mouth daily. Take with or immediately following a meal.    . Multiple Vitamin (MULTIVITAMIN WITH MINERALS) TABS tablet Take 1 tablet by mouth daily.    Marland Kitchen rOPINIRole (REQUIP) 0.25 MG  tablet Take 0.5 mg by mouth at bedtime.     . simvastatin (ZOCOR) 20 MG tablet Take 20 mg by mouth every evening.     No current facility-administered medications for this visit.     SURGICAL HISTORY:  Past Surgical History:  Procedure Laterality Date  . ABDOMINAL HYSTERECTOMY     at age 89;partial   . CARDIOVASCULAR STRESS TEST  06/29/2009   EF 79%, NO ISCHEMIA  . COLONOSCOPY    . IR THORACENTESIS ASP PLEURAL SPACE W/IMG GUIDE  04/25/2017  . 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 VIDEO ASSISTED THORACOSCOPY (VATS)/WEDGE RESECTION Right 07/08/2013   Procedure: VIDEO ASSISTED THORACOSCOPY (VATS)/WEDGE RESECTION;  Surgeon: [name], MD;   Location: [facility];  Service: Thoracic;  Laterality: Right;  Marland Kitchen VIDEO BRONCHOSCOPY N/A 07/08/2013   Procedure: VIDEO BRONCHOSCOPY;  Surgeon: [name], MD;  Location[facility];  Service: Thoracic;  Laterality: N/A;    REVIEW OF SYSTEMS:  A comprehensive review of systems was negative.   PHYSICAL EXAMINATION: General appearance: alert, cooperative and no distress Head: Normocephalic, without obvious abnormality, atraumatic Neck: no adenopathy, no JVD, supple, symmetrical, trachea midline and thyroid not enlarged, symmetric, no tenderness/mass/nodules Lymph nodes: Cervical, supraclavicular, and axillary nodes normal. Resp: diminished breath sounds RLL and dullness to percussion RLL Back: symmetric, no curvature. ROM normal. No CVA tenderness. Cardio: regular rate and rhythm, S1, S2 normal, no murmur, click, rub or gallop GI: soft, non-tender; bowel sounds normal; no masses,  no organomegaly Extremities: extremities normal, atraumatic, no cyanosis or edema  ECOG PERFORMANCE STATUS: 1 - Symptomatic but completely ambulatory  Blood pressure (!) 161/89, pulse 71, temperature 98.5 F (36.9 C), temperature source Oral, resp. rate 18, height 5\' 4"  (1.626 m), weight 154 lb 12.8 oz (70.2 kg), SpO2 100 %.  LABORATORY DATA: Lab Results  Component Value Date   WBC 6.8 07/06/2017   HGB 13.3 07/06/2017   HCT 38.8 07/06/2017   MCV 93.3 07/06/2017   PLT 227 07/06/2017      Chemistry      Component Value Date/Time   NA 130 (L) 07/06/2017 0832   K 4.3 07/06/2017 0832   CL 91 (L) 05/13/2016 0301   CO2 25 07/06/2017 0832   BUN 14.8 07/06/2017 0832   CREATININE 1.2 (H) 07/06/2017 0832      Component Value Date/Time   CALCIUM 9.5 07/06/2017 0832   ALKPHOS 136 07/06/2017 0832   AST 23 07/06/2017 0832   ALT 14 07/06/2017 0832   BILITOT 0.51 07/06/2017 0832       RADIOGRAPHIC STUDIES: Ct Chest W Contrast  Result Date: 07/06/2017 CLINICAL DATA:  Non-small-cell lung cancer status post  right upper lobectomy EXAM: CT CHEST WITH CONTRAST TECHNIQUE: Multidetector CT imaging of the chest was performed during intravenous contrast administration. CONTRAST:  80mL ISOVUE-300 IOPAMIDOL (ISOVUE-300) INJECTION 61% COMPARISON:  03/14/2017 FINDINGS: Cardiovascular: The heart size is normal. No pericardial effusion. Coronary artery calcification is evident. Atherosclerotic calcification is noted in the wall of the thoracic aorta. Mediastinum/Nodes: No mediastinal lymphadenopathy. No left hilar lymphadenopathy. Postsurgical change noted right hilum. The esophagus has normal imaging features. Lungs/Pleura: Volume loss right hemithorax compatible with prior right upper lobectomy. The chronic right pleural fluid collection has decreased in the interval. 3.4 x 1.2 cm nodule posterior right lung adjacent to the pleural fluid was measured previously on soft tissue windows. Re- measuring on soft tissue windows today, the lesion measures smaller at  2.7 x 1.0 cm. On today's exam there are more prominent curvilinear "tails" associated with this nodule suggesting it may represent evolving rounded atelectasis. Lack of hypermetabolism on previous PET-CT also supports benign etiology. Scattered tiny left lung nodules (left upper lobe image 47 series 5, posterior left lower lobe image 91, and posterior left lower lobe image 97) are stable in the interval. No new or progressive abnormality on today's exam. Upper Abdomen: Cortical scarring noted both kidneys, incompletely visualized. No adrenal nodule or mass. Musculoskeletal: Bone windows reveal no worrisome lytic or sclerotic osseous lesions. Mild compression deformity T10 vertebral body is stable in the interval. IMPRESSION: 1. Interval decrease in the chronic right pleural effusion with associated decrease in the nodular opacity posterior right lung adjacent to the pleural fluid/thickening. This nodular opacity has evolved in the interval with imaging features more  suggestive of rounded atelectasis on today's study. 2. Tiny left lung nodules unchanged in the interval. 3.  Emphysema. (ICD10-J43.9) 4.  Aortic Atherosclerois (ICD10-170.0) Electronically Signed   By: Misty Stanley M.D.   On: 07/06/2017 12:51   ASSESSMENT AND PLAN: This is a very pleasant 71 years old white female with synchronous non-small cell lung cancer presenting with stage IA as well as a stage IB status post right upper lobectomy with lymph node dissection and currently undergoing adjuvant chemotherapy with cisplatin and Alimta status post 4 cycles completed in March 2015. The patient is currently on observation and she is feeling fine. The recent CT scan of the chest showed no evidence for disease progression and there was decrease in the chronic right pleural effusion. I discussed the scan results with the patient and her husband.  I recommended for her to continue on observation with repeat CT scan of the chest in 6 months for restaging of her disease. The patient was advised to call immediately if she has any concerning symptoms in the interval. For hypertension, she was advised to monitor her blood pressure closely at home and to reconsult with her primary care physician if needed for adjustment of her medication. The patient voices understanding of current disease status and treatment options and is in agreement with the current care plan. All questions were answered. The patient knows to call the clinic with any problems, questions or concerns. We can certainly see the patient much sooner if necessary.  Disclaimer: This note was dictated with voice recognition software. Similar sounding words can inadvertently be transcribed and may not be corrected upon review.

## 2017-07-13 NOTE — Telephone Encounter (Signed)
Gave avs and calendar for June

## 2017-08-10 ENCOUNTER — Encounter: Payer: Medicare Other | Admitting: Cardiothoracic Surgery

## 2017-08-17 ENCOUNTER — Other Ambulatory Visit: Payer: Self-pay

## 2017-08-17 ENCOUNTER — Ambulatory Visit (INDEPENDENT_AMBULATORY_CARE_PROVIDER_SITE_OTHER): Payer: Medicare Other | Admitting: Cardiothoracic Surgery

## 2017-08-17 ENCOUNTER — Encounter: Payer: Self-pay | Admitting: Cardiothoracic Surgery

## 2017-08-17 VITALS — BP 138/71 | HR 57 | Resp 18 | Ht 64.0 in | Wt 154.2 lb

## 2017-08-17 DIAGNOSIS — Z902 Acquired absence of lung [part of]: Secondary | ICD-10-CM

## 2017-08-17 DIAGNOSIS — C3411 Malignant neoplasm of upper lobe, right bronchus or lung: Secondary | ICD-10-CM | POA: Diagnosis not present

## 2017-08-17 DIAGNOSIS — J90 Pleural effusion, not elsewhere classified: Secondary | ICD-10-CM | POA: Diagnosis not present

## 2017-08-17 NOTE — Progress Notes (Signed)
[address]       [place] [address]             [phone]                     [name] [facility] Medical Record #[id number] Date of Birth: [birth year]  Meghan Drown, MD  Chief Complaint:   PostOp Follow Up Visit 07/08/2013  OPERATIVE REPORT  PREOPERATIVE DIAGNOSIS: Right upper lobe lung mass.  POSTOPERATIVE DIAGNOSIS: Carcinoma of the right upper lobe.  PROCEDURE PERFORMED: Bronchoscopy, right video-assisted thoracoscopy,  wedge resection, right upper lobe, with completion of right upper  lobectomy, lymph node dissection, and placement of On-Q device.  SURGEON: Lanelle Bal, MD    Adjuvant chemotherapy with systemic chemotherapy in the form of cisplatin at 75 mg per meter squared and Alimta 500 mg per meter squared given every 3 weeks for a total of 4 cycles, status post 4 cycles. Last cycle was given 10/23/2013.   PATH: ALK EGFR negative , [facility]  mRNA testing completed see scanned document Diagnosis 1. 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 - PERINEURAL INVASION IS IDENTIFIED. - THE SURGICAL RESECTION MARGINS ARE NEGATIVE FOR ADENOCARCINOMA. - SEE ONCOLOGY TABLE BELOW. 2. 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 - ATYPICAL ADENOMATOUS HYPERPLASIA. - THERE IS NO EVIDENCE OF CARCINOMA IN 2 OF 2 LYMPH NODES (0/2). - THE SURGICAL RESECTION MARGINS ARE NEGATIVE FOR CARCINOMA. - SEE ONCOLOGY TABLE BELOW. 3. Lymph node, biopsy, 10 R - THERE IS NO EVIDENCE OF CARCINOMA IN 1 OF 1 LYMPH NODE (0/1). 1 of 4 FINAL for [name], [name] [phone]) Diagnosis(continued) 4. Lymph node, biopsy, 11 R - THERE IS NO EVIDENCE OF CARCINOMA IN 1 OF 1 LYMPH NODE (0/1). 5. Lymph node, biopsy, 12 R - THERE IS NO EVIDENCE OF CARCINOMA IN 1 OF 1 LYMPH NODE  (0/1) 6. Lymph node, biopsy, 13 R - THERE IS NO EVIDENCE OF CARCINOMA IN 1 OF 1 LYMPH NODE (0/1). 7. Lymph node, biopsy, 4 R - THERE IS NO EVIDENCE OF CARCINOMA IN 1 OF 1 LYMPH NODE (0/1). Microscopic Comment 1. LUNG (Part 1) Specimen, including laterality: Right upper lobe Procedure: Wedge resection followed by completion lobectomy Specimen integrity (intact/disrupted): Intact Tumor site: Right upper lobe Tumor focality: Adenocarcinoma is unifocal Maximum tumor size (cm): 4.3 cm (gross measurement) Histologic type: Adenocarcinoma. Grade: Well differentiated (low grade Margins: Negative for adenocarcinoma Distance to closest margin (cm): 0.8 cm to the nearest stapled margin (gross measurement) Visceral pleura invasion: Not identified Tumor extension: Confined to lung parenchyma Treatment effect (if treated with neoadjuvant therapy): N/A Lymph -Vascular invasion: Not identified Lymph nodes: Number examined - 7; Number N1 nodes positive 0; Number N2 nodes positive 0 TNM code: pT2a, pN0 Ancillary studies: A block will be sent for EGFR and ALK studies and the results reported separately. Comments: The vast majority of the adenocarcinoma has a lepidic growth pattern (so called in-situ adenocarcinoma). However, there are a few small foci of stromal invasion identified. 2. LUNG (Part 2) Specimen, including laterality: Right upper lobe Procedure: Completion lobectomy Specimen integrity (intact/disrupted): Intact Tumor site: Perihilar Tumor focality: Squamous cell carcinoma is unifocal Maximum tumor size (cm): 1.6 cm (gross measurement) Histologic type: Squamous cell carcinoma Grade: Well differentiated (low grade) Margins: Negative for carcinoma Distance to closest margin (  cm): 1.5 cm to the bronchial resection margin (gross measurement) Visceral pleura invasion: Not identified. Tumor extension: Confined to lung parenchyma Treatment effect (if treated with neoadjuvant therapy):  N/A Lymph -Vascular invasion: Not identified Lymph nodes: Number examined - 7; Number N1 nodes positive 0; Number N2 nodes positive 0 TNM code: pT1a, pN0 Ancillary studies: N/A Comments: The squamous cell carcinoma is predominantly in-situ. However, there is a single focus suspicious for early invasion.   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 Following surgery she had systemic chemotherapy in the form of cisplatin at 75 mg per meter squared and Alimta 500 mg per meter squared given every 3 weeks for a total of 4 cycles,   CT scan summer 2018 showed question of rounded atelectasis right lower lobe with new right pleural effusion.  A PET scan was done the patient's scans were reviewed at the multidisciplinary thoracic oncology conference.  A ultrasound directed needle aspiration of the right pleural fluid was performed, cytologies were negative.   Patient returns today with a follow-up chest x-ray.  Since last seen she has had no new symptoms does have occasional dry cough no fever chills or evidence of infection.   Zubrod Score: At the time of surgery this patient's most appropriate activity status/level should be described as: _0     0    Normal activity, no symptoms _1     1    Restricted in physical strenuous activity but ambulatory, able to do out light work _2     2    Ambulatory and capable of self care, unable to do work activities, up and about >50 % of waking hours                              _3     3    Only limited self care, in bed greater than 50% of waking hours _4     4    Completely disabled, no self care, confined to bed or chair _5     5    Moribund   Social History   Tobacco Use  Smoking Status Former Smoker  . Types: Cigarettes  . Last attempt to quit: 08/08/1978  . Years  since quitting: 39.0  Smokeless Tobacco Never Used  Tobacco Comment   quit 35 yrs ago       Allergies  Allergen Reactions  . Thiazide-Type Diuretics     Hyponatremia     Current Outpatient Prescriptions  Medication Sig Dispense Refill  . amLODipine (NORVASC) 5 MG tablet Take 5 mg by mouth daily.      . chlorthalidone (HYGROTON) 25 MG tablet Take 25 mg by mouth daily.      . Cholecalciferol (VITAMIN D-3 PO) Take 1 tablet by mouth 2 (two) times a week.       . esomeprazole (NEXIUM) 40 MG capsule Take 40 mg by mouth daily before breakfast.      . folic acid (FOLVITE) 1 MG tablet TAKE 1 TABLET (1 MG TOTAL) BY MOUTH DAILY.  30 tablet  3  . Multiple Vitamins-Minerals (PRESERVISION AREDS PO) Take 1 tablet by mouth daily.       Marland Kitchen oxyCODONE-acetaminophen (PERCOCET/ROXICET) 5-325 MG per tablet Take 1-2 tablets by mouth every 8 (  eight) hours as needed for severe pain.  30 tablet  0  . simvastatin (ZOCOR) 20 MG tablet Take 20 mg by mouth every evening.           Physical Exam: BP 138/71 (BP Location: Right Arm, Patient Position: Sitting, Cuff Size: Normal)   Pulse (!) 57   Resp 18   Ht _0  (1.626 m)   Wt 154 lb 3.2 oz (69.9 kg)   SpO2 98% Comment: RA  BMI 26.47 kg/m   General appearance: alert and cooperative Neurologic: intact Heart: regular rate and rhythm, S1, S2 normal, no murmur, click, rub or gallop Lungs: diminished breath sounds RLL Abdomen: soft, non-tender; bowel sounds normal; no masses,  no organomegaly Extremities: extremities normal, atraumatic, no cyanosis or edema and Homans sign is negative, no sign of DVT Wound: Right chest port sites/incisions healing well, sutures removed No cervical supraclavicular adenopathy    Diagnostic Studies & Laboratory data:         Recent Radiology Findings: CLINICAL DATA:  Non-small-cell lung cancer status post right upper lobectomy  EXAM: CT CHEST WITH CONTRAST  TECHNIQUE: Multidetector CT imaging of the chest was  performed during intravenous contrast administration.  CONTRAST:  67m ISOVUE-300 IOPAMIDOL (ISOVUE-300) INJECTION 61%  COMPARISON:  03/14/2017  FINDINGS: Cardiovascular: The heart size is normal. No pericardial effusion. Coronary artery calcification is evident. Atherosclerotic calcification is noted in the wall of the thoracic aorta.  Mediastinum/Nodes: No mediastinal lymphadenopathy. No left hilar lymphadenopathy. Postsurgical change noted right hilum. The esophagus has normal imaging features.  Lungs/Pleura: Volume loss right hemithorax compatible with prior right upper lobectomy. The chronic right pleural fluid collection has decreased in the interval. 3.4 x 1.2 cm nodule posterior right lung adjacent to the pleural fluid was measured previously on soft tissue windows. Re- measuring on soft tissue windows today, the lesion measures smaller at 2.7 x 1.0 cm. On today's exam there are more prominent curvilinear "tails" associated with this nodule suggesting it may represent evolving rounded atelectasis. Lack of hypermetabolism on previous PET-CT also supports benign etiology. Scattered tiny left lung nodules (left upper lobe image 47 series 5, posterior left lower lobe image 91, and posterior left lower lobe image 97) are stable in the interval. No new or progressive abnormality on today's exam.  Upper Abdomen: Cortical scarring noted both kidneys, incompletely visualized. No adrenal nodule or mass.  Musculoskeletal: Bone windows reveal no worrisome lytic or sclerotic osseous lesions. Mild compression deformity T10 vertebral body is stable in the interval.  IMPRESSION: 1. Interval decrease in the chronic right pleural effusion with associated decrease in the nodular opacity posterior right lung adjacent to the pleural fluid/thickening. This nodular opacity has evolved in the interval with imaging features more suggestive of rounded atelectasis on today's study. 2.  Tiny left lung nodules unchanged in the interval. 3.  Emphysema. (ICD10-J43.9) 4.  Aortic Atherosclerois (ICD10-170.0)   Electronically Signed   By: EMisty StanleyM.D.   On: 07/06/2017 12:51  I have independently reviewed the above radiology studies  and reviewed the findings with the patient.   Study Result   CLINICAL DATA:  Right lung cancer. Status post right upper lobectomy.  EXAM: CT CHEST WITH CONTRAST  TECHNIQUE: Multidetector CT imaging of the chest was performed during intravenous contrast administration.  CONTRAST:  771mISOVUE-300 IOPAMIDOL (ISOVUE-300) INJECTION 61%  COMPARISON:  03/15/2016  FINDINGS: Cardiovascular: The heart size is normal. No pericardial effusion. Coronary artery calcification is noted. Atherosclerotic calcification is noted in the wall  of the thoracic aorta.  Mediastinum/Nodes: No mediastinal lymphadenopathy. No evidence for gross hilar lymphadenopathy although assessment is limited by the lack of intravenous contrast on today's study. The esophagus has normal imaging features. There is no axillary lymphadenopathy.  Lungs/Pleura: Volume loss right hemithorax compatible with prior right upper lobectomy. Assessment of the upper lungs is limited by respiratory motion artifact. Centrilobular emphysema noted. Loculated right pleural fluid collection is new in the interval and there is a 3.4 x 1.2 cm masslike area of lung consolidation posteriorly on the right.Scattered tiny left lung nodules are similar to prior.  Upper Abdomen: Cortical scarring noted in both kidneys.  Musculoskeletal: Bone windows reveal no worrisome lytic or sclerotic osseous lesions.  IMPRESSION: 1. Prominent interval change with development of a posterior rim enhancing and loculated right pleural fluid collection. Immediately adjacent to this pleural collection is a new 3.4 x 1.2 cm focal area of lung consolidation. As recurrent disease is now a  concern, PET-CT may prove helpful to further evaluate. 2. Stable appearance scattered tiny left lung nodules. Next item insert 3.  Emphysema. (ICD10-J43.9) 4.  Aortic Atherosclerois (ICD10-170.0)   Electronically Signed   By: Misty Stanley M.D.   On: 03/14/2017 12:45   Study Result   CLINICAL DATA:  Subsequent treatment strategy for non small cell lung carcinoma.  EXAM: NUCLEAR MEDICINE PET SKULL BASE TO THIGH  TECHNIQUE: 7.6 mCi F-18 FDG was injected intravenously. Full-ring PET imaging was performed from the skull base to thigh after the radiotracer. CT data was obtained and used for attenuation correction and anatomic localization.  FASTING BLOOD GLUCOSE:  Value: 105 mg/dl  COMPARISON:  PET-CT 07/01/2013  FINDINGS: NECK  No hypermetabolic lymph nodes in the neck.  CHEST  Postsurgical change in the RIGHT upper lobe consistent with lobectomy. No evidence of local recurrence in the RIGHT upper lung.  Fluid collection in the posterior RIGHT lower lobe with thickened pleural surface with moderate metabolic activity (SUV max equal 3.2). Focus of consolidation in the adjacent RIGHT lower lobe measuring 3.0 x 1.4 cm with mild metabolic activity for size (SUV max equal 2.9).  No hypermetabolic mediastinal lymph nodes.  ABDOMEN/PELVIS  No abnormal hypermetabolic activity within the liver, pancreas, adrenal glands, or spleen. No hypermetabolic lymph nodes in the abdomen or pelvis.  SKELETON  No focal hypermetabolic activity to suggest skeletal metastasis.  Post hysterectomy.  Atherosclerotic calcification of the aorta.  IMPRESSION: 1. Fluid collection in RIGHT lower lobe with thickened parietal and visceral pleural and mild to moderate metabolic activity. Favor empyema or other benign collection over lung cancer recurrence however consider thoracentesis with cytology for further evaluation. 2. Rounded focus consolidation in the RIGHT lower  lobe adjacent to the fluid collection with mild to moderate metabolic activity favors rounded atelectasis. 3. No evidence of mediastinal nodal metastasis or distant disease.   Electronically Signed   By: Suzy Bouchard M.D.   On: 04/03/2017 09:05      Ct Chest W Contrast  03/05/2015   CLINICAL DATA:  Right upper lobe lung cancer diagnosed December 2014. Chemotherapy completed March 2015. Right upper lobectomy. Restaging.  EXAM: CT CHEST WITH CONTRAST  TECHNIQUE: Multidetector CT imaging of the chest was performed during intravenous contrast administration.  CONTRAST:  50m OMNIPAQUE IOHEXOL 300 MG/ML  SOLN  COMPARISON:  08/28/2014  FINDINGS: Mediastinum/Nodes: Aortic and branch vessel atherosclerotic calcification noted. No pathologic adenopathy.  Lungs/Pleura: Right upper lobectomy. Mild findings of centrilobular emphysema.  Sub solid 4 by 5 mm nodule in  the left lower lobe, image 41 series 5, no change from 06/19/2013. 4 mm sub solid nodule in the left lower lobe on image 39 series 5, no change from 06/19/2013. 3 mm ground-glass nodular density in the left upper lobe, no change from 06/19/2013.  Upper abdomen: Unremarkable  Musculoskeletal: Levoconvex lumbar scoliosis is partially imaged, with rotary component.  IMPRESSION: 1. No recurrent malignancy identified. 2. Several tiny sub solid left pulmonary nodules are unchanged the earliest comparable reference exam from 06/19/2013, and highly likely to be benign. These may merit attention on followup studies. 3. Atherosclerosis. 4. Levoconvex lumbar scoliosis.   Electronically Signed   By: Van Clines M.D.   On: 03/05/2015 09:31    Recent Labs: Lab Results  Component Value Date   WBC 6.8 07/06/2017   HGB 13.3 07/06/2017   HCT 38.8 07/06/2017   PLT 227 07/06/2017   GLUCOSE 96 07/06/2017   ALT 14 07/06/2017   AST 23 07/06/2017   NA 130 (L) 07/06/2017   K 4.3 07/06/2017   CL 91 (L) 05/13/2016   CREATININE 1.2 (H) 07/06/2017    BUN 14.8 07/06/2017   CO2 25 07/06/2017   INR 0.89 07/03/2013   Adequacy Reason Satisfactory For Evaluation. Diagnosis PLEURAL FLUID, RIGHT (SPECIMEN 1 OF 1 COLLECTED 04-25-2017) NO MALIGNANT CELLS IDENTIFIED. Enid Cutter MD Pathologist, Electronic Signature (Case signed 04/27/2017)  Assessment / Plan:   CT scan of the chest appears stable without evidence of new malignancy.  Recent CT scan shows no evidence of disease progression and decrease in size of the chronic right pleural effusion.  CT scans have been reviewed with the patient and her husband. She is to have a follow-up CT scan in 6 months as ordered by medical oncology and I will see her following this    Meghan Isaac MD      [address] [place] [address] Office [phone]   Beeper [phone]

## 2017-09-20 ENCOUNTER — Encounter (HOSPITAL_COMMUNITY): Payer: Self-pay | Admitting: Emergency Medicine

## 2017-09-20 ENCOUNTER — Other Ambulatory Visit: Payer: Self-pay

## 2017-09-20 ENCOUNTER — Inpatient Hospital Stay (HOSPITAL_COMMUNITY)
Admission: EM | Admit: 2017-09-20 | Discharge: 2017-09-26 | DRG: 488 | Disposition: A | Discharge status: Skilled Nursing Facility | Payer: Medicare Other | Attending: Family Medicine | Admitting: Family Medicine

## 2017-09-20 ENCOUNTER — Emergency Department (HOSPITAL_COMMUNITY): Payer: Medicare Other

## 2017-09-20 DIAGNOSIS — R55 Syncope and collapse: Secondary | ICD-10-CM | POA: Diagnosis present

## 2017-09-20 DIAGNOSIS — E876 Hypokalemia: Secondary | ICD-10-CM | POA: Diagnosis not present

## 2017-09-20 DIAGNOSIS — E860 Dehydration: Secondary | ICD-10-CM | POA: Diagnosis present

## 2017-09-20 DIAGNOSIS — Z419 Encounter for procedure for purposes other than remedying health state, unspecified: Secondary | ICD-10-CM

## 2017-09-20 DIAGNOSIS — F1020 Alcohol dependence, uncomplicated: Secondary | ICD-10-CM | POA: Diagnosis present

## 2017-09-20 DIAGNOSIS — S82832A Other fracture of upper and lower end of left fibula, initial encounter for closed fracture: Secondary | ICD-10-CM | POA: Diagnosis present

## 2017-09-20 DIAGNOSIS — Z90710 Acquired absence of both cervix and uterus: Secondary | ICD-10-CM

## 2017-09-20 DIAGNOSIS — I130 Hypertensive heart and chronic kidney disease with heart failure and stage 1 through stage 4 chronic kidney disease, or unspecified chronic kidney disease: Secondary | ICD-10-CM | POA: Diagnosis present

## 2017-09-20 DIAGNOSIS — R42 Dizziness and giddiness: Secondary | ICD-10-CM

## 2017-09-20 DIAGNOSIS — R0902 Hypoxemia: Secondary | ICD-10-CM

## 2017-09-20 DIAGNOSIS — J449 Chronic obstructive pulmonary disease, unspecified: Secondary | ICD-10-CM | POA: Diagnosis present

## 2017-09-20 DIAGNOSIS — S83262A Peripheral tear of lateral meniscus, current injury, left knee, initial encounter: Secondary | ICD-10-CM | POA: Diagnosis present

## 2017-09-20 DIAGNOSIS — C3411 Malignant neoplasm of upper lobe, right bronchus or lung: Secondary | ICD-10-CM | POA: Diagnosis not present

## 2017-09-20 DIAGNOSIS — E871 Hypo-osmolality and hyponatremia: Secondary | ICD-10-CM | POA: Diagnosis present

## 2017-09-20 DIAGNOSIS — K219 Gastro-esophageal reflux disease without esophagitis: Secondary | ICD-10-CM | POA: Diagnosis present

## 2017-09-20 DIAGNOSIS — N182 Chronic kidney disease, stage 2 (mild): Secondary | ICD-10-CM | POA: Diagnosis present

## 2017-09-20 DIAGNOSIS — Z902 Acquired absence of lung [part of]: Secondary | ICD-10-CM

## 2017-09-20 DIAGNOSIS — S82142A Displaced bicondylar fracture of left tibia, initial encounter for closed fracture: Principal | ICD-10-CM

## 2017-09-20 DIAGNOSIS — W010XXA Fall on same level from slipping, tripping and stumbling without subsequent striking against object, initial encounter: Secondary | ICD-10-CM | POA: Diagnosis present

## 2017-09-20 DIAGNOSIS — N289 Disorder of kidney and ureter, unspecified: Secondary | ICD-10-CM

## 2017-09-20 DIAGNOSIS — C3410 Malignant neoplasm of upper lobe, unspecified bronchus or lung: Secondary | ICD-10-CM | POA: Diagnosis present

## 2017-09-20 DIAGNOSIS — S82143A Displaced bicondylar fracture of unspecified tibia, initial encounter for closed fracture: Secondary | ICD-10-CM | POA: Diagnosis present

## 2017-09-20 DIAGNOSIS — Y9301 Activity, walking, marching and hiking: Secondary | ICD-10-CM | POA: Diagnosis present

## 2017-09-20 DIAGNOSIS — T148XXA Other injury of unspecified body region, initial encounter: Secondary | ICD-10-CM

## 2017-09-20 DIAGNOSIS — E7849 Other hyperlipidemia: Secondary | ICD-10-CM

## 2017-09-20 DIAGNOSIS — M79662 Pain in left lower leg: Secondary | ICD-10-CM | POA: Diagnosis not present

## 2017-09-20 DIAGNOSIS — S72432A Displaced fracture of medial condyle of left femur, initial encounter for closed fracture: Secondary | ICD-10-CM | POA: Diagnosis present

## 2017-09-20 DIAGNOSIS — Z79899 Other long term (current) drug therapy: Secondary | ICD-10-CM

## 2017-09-20 DIAGNOSIS — J9601 Acute respiratory failure with hypoxia: Secondary | ICD-10-CM | POA: Diagnosis not present

## 2017-09-20 DIAGNOSIS — Z9221 Personal history of antineoplastic chemotherapy: Secondary | ICD-10-CM

## 2017-09-20 DIAGNOSIS — E785 Hyperlipidemia, unspecified: Secondary | ICD-10-CM | POA: Diagnosis present

## 2017-09-20 DIAGNOSIS — I5032 Chronic diastolic (congestive) heart failure: Secondary | ICD-10-CM | POA: Diagnosis present

## 2017-09-20 DIAGNOSIS — Z87891 Personal history of nicotine dependence: Secondary | ICD-10-CM

## 2017-09-20 DIAGNOSIS — Z01810 Encounter for preprocedural cardiovascular examination: Secondary | ICD-10-CM

## 2017-09-20 DIAGNOSIS — Z85118 Personal history of other malignant neoplasm of bronchus and lung: Secondary | ICD-10-CM

## 2017-09-20 DIAGNOSIS — R52 Pain, unspecified: Secondary | ICD-10-CM

## 2017-09-20 DIAGNOSIS — D631 Anemia in chronic kidney disease: Secondary | ICD-10-CM | POA: Diagnosis present

## 2017-09-20 DIAGNOSIS — R9431 Abnormal electrocardiogram [ECG] [EKG]: Secondary | ICD-10-CM

## 2017-09-20 DIAGNOSIS — Y92511 Restaurant or cafe as the place of occurrence of the external cause: Secondary | ICD-10-CM

## 2017-09-20 LAB — BASIC METABOLIC PANEL
Anion gap: 16 — ABNORMAL HIGH (ref 5–15)
BUN: 15 mg/dL (ref 6–20)
CO2: 19 mmol/L — ABNORMAL LOW (ref 22–32)
CREATININE: 1.25 mg/dL — AB (ref 0.44–1.00)
Calcium: 9.5 mg/dL (ref 8.9–10.3)
Chloride: 93 mmol/L — ABNORMAL LOW (ref 101–111)
GFR calc Af Amer: 49 mL/min — ABNORMAL LOW (ref >60)
GFR, EST NON AFRICAN AMERICAN: 42 mL/min — AB (ref >60)
Glucose, Bld: 130 mg/dL — ABNORMAL HIGH (ref 65–99)
Potassium: 3.4 mmol/L — ABNORMAL LOW (ref 3.5–5.1)
Sodium: 128 mmol/L — ABNORMAL LOW (ref 135–145)

## 2017-09-20 LAB — CBC
HCT: 38.1 % (ref 36.0–46.0)
Hemoglobin: 13.7 g/dL (ref 12.0–15.0)
MCH: 33.9 pg (ref 26.0–34.0)
MCHC: 36 g/dL (ref 30.0–36.0)
MCV: 94.3 fL (ref 78.0–100.0)
PLATELETS: 234 10*3/uL (ref 150–400)
RBC: 4.04 MIL/uL (ref 3.87–5.11)
RDW: 12.6 % (ref 11.5–15.5)
WBC: 12.5 10*3/uL — ABNORMAL HIGH (ref 4.0–10.5)

## 2017-09-20 MED ORDER — HYDROMORPHONE HCL 1 MG/ML IJ SOLN
1.0000 mg | Freq: Once | INTRAMUSCULAR | Status: AC
  Administered 2017-09-20: 1 mg via INTRAVENOUS
  Filled 2017-09-20: qty 1

## 2017-09-20 MED ORDER — SODIUM CHLORIDE 0.9 % IV BOLUS (SEPSIS)
1000.0000 mL | Freq: Once | INTRAVENOUS | Status: AC
  Administered 2017-09-20: 1000 mL via INTRAVENOUS

## 2017-09-20 NOTE — H&P (Signed)
[name] XFG:[id number] DOB: Dec 28, 1945 DOA: 09/20/2017     PCP: Meghan Dy, MD   Outpatient Specialists: Oncology [name] Patient coming from:    home Lives  With family    Chief Complaint: fall, leg pain   HPI: [name] is a 72 y.o. female with medical history significant of LUNG cancer sp resection sp chemotherapy complicated by right pleural effusion, COPD    Presented with the patient and falling today resulting in severe left leg pain. Patient apparently was walking slipped and fell on concrete on her back with the leg being twisted underneath, no head injury denies syncope or chest pain While awaiting in emergency department waiting in the room became presyncopal never lost consciousness.   Reports hx of syncopal spells. She was admitted and had work up. ECHO in 2017 had preserved EF and diastolic CHF  Reports no chest pain with ambulation, states she is able to walk a flight of stairs.   Patient has known history of lung cancer has been followed by oncology and recently cardiothoracic surgery given right pleural effusion sp thoracentesis.Marland Kitchen Sp chemotherapy last treatment 2015    While in ER: Patient had a near syncopal episode sinus on telemetry while monitored  Significant initial  Findings: WBC 12.5 hemoglobin 13.7 sodium 128 appears to be around baseline K3.4 bicarbonate 19 creatinine 1.25 which is close to baseline Anion gap 16 Lumbar spine unremarkable  Left TBA showing commuted fracture of proximal Tibia Depressed lateral tibial plateau nondisplaced radial fracture of left fibula head  ER provider discussed case with: Orthopedics Dr. Erlinda [name] Who recommends: Medical admission in the immobilizer will see tomorrow for possible surgical intervention We'll see patient in consult in the morning    IN ER:  Temp (24hrs), Avg:96.7 F (35.9 C), Min:96.7 F (35.9 C), Max:96.7 F (35.9 C)      on arrival  ED Triage Vitals  Enc Vitals Group     BP  09/20/17 2058 131/77     Pulse Rate 09/20/17 2058 (!) 59     Resp 09/20/17 2058 (!) 22     Temp 09/20/17 2058 (!) 96.7 F (35.9 C)     Temp Source 09/20/17 2058 Temporal     SpO2 09/20/17 2058 100 %     Weight --      Height --      Head Circumference --      Peak Flow --      Pain Score 09/20/17 2114 10     Pain Loc --      Pain Edu? --      Excl. in [place]? --     Latest  100% HR 55 BP 176/67  Following Medications were ordered in ER: Medications  HYDROmorphone (DILAUDID) injection 1 mg (1 mg Intravenous Given 09/20/17 2123)  sodium chloride 0.9 % bolus 1,000 mL (0 mLs Intravenous Stopped 09/20/17 2146)    Hospitalist was called for admission for presyncope workup and Tibial platau fracture     Review of Systems:    Pertinent positives include: leg pain  Constitutional:  No weight loss, night sweats, Fevers, chills, fatigue, weight loss  HEENT:  No headaches, Difficulty swallowing,Tooth/dental problems,Sore throat,  No sneezing, itching, ear ache, nasal congestion, post nasal drip,  Cardio-vascular:  No chest pain, Orthopnea, PND, anasarca, dizziness, palpitations.no Bilateral lower extremity swelling  GI:  No heartburn, indigestion, abdominal pain, nausea, vomiting, diarrhea, change in bowel habits, loss of appetite, melena, blood in stool, hematemesis Resp:  no shortness of breath at rest. No dyspnea on exertion, No excess mucus, no productive cough, No non-productive cough, No coughing up of blood.No change in color of mucus.No wheezing. Skin:  no rash or lesions. No jaundice GU:  no dysuria, change in color of urine, no urgency or frequency. No straining to urinate.  No flank pain.  Musculoskeletal:  No joint pain or no joint swelling. No decreased range of motion. No back pain.  Psych:  No change in mood or affect. No depression or anxiety. No memory loss.  Neuro: no localizing neurological complaints, no tingling, no weakness, no double vision, no gait abnormality,  no slurred speech, no confusion  As per HPI otherwise 10 point review of systems negative.   Past Medical History: Past Medical History:  Diagnosis Date  . Arthritis    back   . Cavitating mass in right upper lung lobe   . CKD (chronic kidney disease) stage 3, GFR 30-59 ml/min (HCC) 03/12/2015  . Diverticulosis   . Dizziness    occasionally   . Family history of kidney infection    pt history-saw Dr.Wrenn  . GERD (gastroesophageal reflux disease)    takes Nexium daily  . History of colon polyps   . Hyperlipidemia    takes Zocor daily  . Hypertension    takes Amlodipine daily and Chlorthalidone as well  . Lung cancer, Right upper lobe   . Lung nodules    right upper lobe  . Nocturia   . Pleural effusion on right 04/05/2017   Past Surgical History:  Procedure Laterality Date  . ABDOMINAL HYSTERECTOMY     at age 50;partial   . CARDIOVASCULAR STRESS TEST  06/29/2009   EF 79%, NO ISCHEMIA  . COLONOSCOPY    . IR THORACENTESIS ASP PLEURAL SPACE W/IMG GUIDE  04/25/2017  . TRACHEOSTOMY     at age 69 or 5 d/t polyps on vocal cord  . VIDEO ASSISTED THORACOSCOPY (VATS)/WEDGE RESECTION Right 07/08/2013   Procedure: VIDEO ASSISTED THORACOSCOPY (VATS)/WEDGE RESECTION;  Surgeon: [name], MD;  Location: [place];  Service: Thoracic;  Laterality: Right;  Marland Kitchen VIDEO BRONCHOSCOPY N/A 07/08/2013   Procedure: VIDEO BRONCHOSCOPY;  Surgeon: [name], MD;  Location: [facility] OR;  Service: Thoracic;  Laterality: N/A;     Social History:  Ambulatory  independently      reports that she quit smoking about 39 years ago. Her smoking use included cigarettes. she has never used smokeless tobacco. She reports that she drinks alcohol. She reports that she does not use drugs.  Allergies:   Allergies  Allergen Reactions  . Thiazide-Type Diuretics     Hyponatremia        Family History:   Family History  Problem Relation Age of Onset  . Heart attack Father   . Cancer Brother         stomach    Medications: Prior to Admission medications   Medication Sig Start Date End Date Taking? Authorizing Provider  esomeprazole (NEXIUM) 40 MG capsule Take 40 mg by mouth daily before breakfast.    [provider]  furosemide (LASIX) 20 MG tablet Take 1 tablet (20 mg total) by mouth daily. Patient taking differently: Take 20 mg by mouth 3 (three) times a week.  05/13/16   Hongalgi, Lenis Dickinson, MD  KLOR-CON 10 10 MEQ tablet Take 10 mEq by mouth 3 (three) times a week.  06/25/15   [provider]  LORazepam (ATIVAN) 1 MG tablet Take 1  mg by mouth daily. 04/15/16   [provider]  losartan (COZAAR) 25 MG tablet Take 2 tablets (50 mg total) by mouth daily. 05/13/16   Hongalgi, Lenis Dickinson, MD  metoprolol succinate (TOPROL-XL) 50 MG 24 hr tablet Take 50 mg by mouth daily. Take with or immediately following a meal.    [provider]  Multiple Vitamin (MULTIVITAMIN WITH MINERALS) TABS tablet Take 1 tablet by mouth daily. 05/14/16   Hongalgi, Lenis Dickinson, MD  rOPINIRole (REQUIP) 0.25 MG tablet Take 0.5 mg by mouth at bedtime.  01/06/16   [provider]  simvastatin (ZOCOR) 20 MG tablet Take 20 mg by mouth every evening.    [provider]    Physical Exam: Patient Vitals for the past 24 hrs:  BP Temp Temp src Pulse Resp SpO2  09/20/17 2245 (!) 176/67 - - (!) 55 (!) 9 100 %  09/20/17 2230 (!) 159/71 - - (!) 51 (!) 9 99 %  09/20/17 2215 (!) 165/72 - - (!) 57 11 99 %  09/20/17 2200 (!) 174/70 - - 60 14 92 %  09/20/17 2058 131/77 (!) 96.7 F (35.9 C) Temporal (!) 59 (!) 22 100 %    1. General:  in No Acute distress   Chronically ill -appearing 2. Psychological: Alert and   Oriented 3. Head/ENT:     Dry Mucous Membranes                          Head Non traumatic, neck supple                           Poor Dentition 4. SKIN:   decreased Skin turgor,  Skin clean Dry and intact no rash 5. Heart: Regular rate and rhythm no Murmur, no Rub or  gallop 6. Lungs: no wheezes or crackles   7. Abdomen: Soft, non-tender, slightly distended  Obese  8. Lower extremities: no clubbing, cyanosis, or edema 9. Neurologically Grossly intact, moving all 4 extremities equally  10. MSK: Normal range of motion Limited in left leg   body mass index is unknown because there is no height or weight on file.  Labs on Admission:   Labs on Admission: I have personally reviewed following labs and imaging studies  CBC: Recent Labs  Lab 09/20/17 2119  WBC 12.5*  HGB 13.7  HCT 38.1  MCV 94.3  PLT 220   Basic Metabolic Panel: Recent Labs  Lab 09/20/17 2119  NA 128*  K 3.4*  CL 93*  CO2 19*  GLUCOSE 130*  BUN 15  CREATININE 1.25*  CALCIUM 9.5   GFR: CrCl cannot be calculated (Unknown ideal weight.). Liver Function Tests: No results for input(s): AST, ALT, ALKPHOS, BILITOT, PROT, ALBUMIN in the last 168 hours. No results for input(s): LIPASE, AMYLASE in the last 168 hours. No results for input(s): AMMONIA in the last 168 hours. Coagulation Profile: No results for input(s): INR, PROTIME in the last 168 hours. Cardiac Enzymes: No results for input(s): CKTOTAL, CKMB, CKMBINDEX, TROPONINI in the last 168 hours. BNP (last 3 results) No results for input(s): PROBNP in the last 8760 hours. HbA1C: No results for input(s): HGBA1C in the last 72 hours. CBG: No results for input(s): GLUCAP in the last 168 hours. Lipid Profile: No results for input(s): CHOL, HDL, LDLCALC, TRIG, CHOLHDL, LDLDIRECT in the last 72 hours. Thyroid Function Tests: No results for input(s): TSH, T4TOTAL, FREET4, T3FREE,  THYROIDAB in the last 72 hours. Anemia Panel: No results for input(s): VITAMINB12, FOLATE, FERRITIN, TIBC, IRON, RETICCTPCT in the last 72 hours. Urine analysis:  Sepsis Labs: @LABRCNTIP (procalcitonin:4,lacticidven:4) )No results found for this or any previous visit (from the past 240 hour(s)).    UA ordered  No results found for: HGBA1C   CrCl cannot be calculated (Unknown ideal weight.).  BNP (last 3 results) No results for input(s): PROBNP in the last 8760 hours.   ECG REPORT ordered  There were no vitals filed for this visit.   Cultures:    Component Value Date/Time   SDES FLUID RIGHT PLEURAL 04/25/2017 1415   SPECREQUEST NONE 04/25/2017 1415   CULT NO GROWTH 3 DAYS 04/25/2017 1415   REPTSTATUS 04/28/2017 FINAL 04/25/2017 1415     Radiological Exams on Admission: Dg Chest 1 View  Result Date: 09/20/2017 CLINICAL DATA:  Slipped and fell on water at restaurant. EXAM: CHEST 1 VIEW COMPARISON:  Chest radiograph June 01, 2017 FINDINGS: Cardiac silhouette is mildly enlarged and unchanged. Mildly calcified aortic knob. Chronic RIGHT lung base pleural thickening and scarring. No focal consolidation. No pneumothorax. Included osseous structures are non suspicious. IMPRESSION: Stable cardiomegaly and RIGHT lung base pleural thickening/scarring. Electronically Signed   By: Elon Alas M.D.   On: 09/20/2017 22:19   Dg Thoracic Spine 2 View  Result Date: 09/20/2017 CLINICAL DATA:  72 year old female status post slip and fall at rest trauma. Pain. Personal history of lung cancer status post right upper lobectomy. EXAM: THORACIC SPINE 2 VIEWS COMPARISON:  Chest CT 07/06/2017 FINDINGS: Thoracic segmentation appears normal. There is levoconvex scoliosis of the lower thoracic spine in conjunction with lumbar scoliosis, resulting in some rotation of the lower thoracic vertebrae. Superimposed flowing lower thoracic endplate osteophytes. T9 butterfly vertebra, more apparent on the comparison CT. Cervicothoracic junction alignment is within normal limits. Stable thoracic vertebral height and alignment. Posterior ribs appear intact. Visualized thoracic visceral contours are stable. IMPRESSION: No acute osseous abnormality identified in the thoracic spine. Electronically Signed   By: Genevie Ann M.D.   On: 09/20/2017 22:20   Dg Lumbar  Spine Complete  Result Date: 09/20/2017 CLINICAL DATA:  72 year old female status post slip and fall at restaurant. Pain. EXAM: LUMBAR SPINE - COMPLETE 4+ VIEW COMPARISON:  Lumbar radiographs 09/20/2015. FINDINGS: Chronic moderate to severe lumbar scoliosis with a rotatory component. Stable lumbar vertebral height and alignment since 2017. Associated chronic severe lumbar disc space loss and bulky endplate osteophytosis. Stable visible sacrum and SI joints. Visible lower thoracic levels appear stable. No acute osseous abnormality identified. Negative visible bowel gas pattern. Calcified aortic atherosclerosis. IMPRESSION: 1.  No acute osseous abnormality identified in the lumbar spine. 2. Chronic severe lumbar scoliosis, disc, and endplate degeneration. Electronically Signed   By: Genevie Ann M.D.   On: 09/20/2017 22:14   Ct Knee Left Wo Contrast  Result Date: 09/21/2017 CLINICAL DATA:  Fracture of the knee after fall. EXAM: CT OF THE  KNEE WITHOUT CONTRAST TECHNIQUE: Multidetector CT imaging of the knee was performed according to the standard protocol. Multiplanar CT image reconstructions were also generated. COMPARISON:  None. FINDINGS: Bones/Joint/Cartilage There is an acute, closed, bicondylar fracture of the tibial plateau with up to 19 mm of central depression involving the lateral tibial plateau and with comminution. A sagittal oblique fracture without depression involving the medial tibial plateau is also noted exiting through the medial tibial metadiaphysis, series 6, image 53. Fracture also appears to undermining the tibial spine. There is an associated  moderate suprapatellar lipohemarthrosis. A comminuted fibular head and neck fracture is noted without displacement. A fracture along the lateral cortex of the medial femoral condyle is also present. Ligaments Suboptimally assessed by CT. Given fracture of the medial femoral condyle along the expected course of the MCL, injury to the MCL is not excluded.  Muscles and Tendons No intramuscular hemorrhage or atrophy. Soft tissues Posttraumatic subcutaneous soft tissue induration about the knee. IMPRESSION: 1. Acute bicondylar fracture of the tibial plateau with central depression of the lateral tibial plateau to 19 mm with comminution. Sagittal oblique fracture through the medial tibial plateau is noted. Fracture appears to also undermines the tibial spine. Findings would be in keeping with a Schatzker type IV fracture. 2. Nondisplaced fracture of the medial femoral condyle along its outer cortex. Suspect medial collateral ligament injury given location of this fracture. 3. Comminuted fibular head and neck fracture without displacement. 4. Lipohemarthrosis. Electronically Signed   By: Ashley Royalty M.D.   On: 09/21/2017 01:07   Dg Knee Complete 4 Views Left  Result Date: 09/20/2017 CLINICAL DATA:  72 year old female slipped and fell at restaurant with severe left knee pain. EXAM: LEFT KNEE - COMPLETE 4+ VIEW COMPARISON:  None. FINDINGS: No straight AP view of the left knee is provided. Comminuted nondisplaced fracture of the left fibula head is suspected. Comminuted fracture of the proximal left tibia is identified and is associated with depression of the left lateral tibial plateau. The medial plateau appears to remain intact. The patella appears intact. The distal left femur appears intact. Only a small joint effusion is evident. IMPRESSION: 1. Comminuted fracture of the proximal left tibia with depressed lateral tibial plateau. 2. Nondisplaced comminuted fracture of the left fibula head. Electronically Signed   By: Genevie Ann M.D.   On: 09/20/2017 22:17    Chart has been reviewed    Assessment/Plan  72 y.o. female with medical history significant of LUNG cancer sp resection currently on chemotherapy complicated by right pleural effusion, COPD  Admitted for presyncope workup and Tibial of fracture   Present on Admission:   . Syncope - in the setting of  severe pain, likley vaso vagal, not a true syncope given tno LOC but rather presyncopal episode. Given risk factors wil obtain ECG, CE, repeat echo   . Hyperlipidemia - stable continue home medicaitons . Tibial plateau fracture -  Appreciate orthopedics consult,  - management as per orthopedics,     Keep nothing by mouth post midnight. Patient not on anticoagulation or antiplatelet agents  Ordered type and screen, , order a vitamin D level  Patient at baseline able to walk a flight of stairs or 100 feet  Patient denies any chest pain or shortness of breath currently and/or with exertion,  ECG - ordered no known history of coronary artery disease, COPD  Liver failure  Mild CKD with stable Cr  Given advanced age patient is at least moderate  risk   will order echo cycle CE given recent presyncope      . Hypokalemia - - will replace and repeat in AM,  check magnesium level and replace as needed  . Hyponatremia - long standing but worse from baseline - likely secondary to dehydration, will give IVF, check Urine Na, Cr, Osmolarity. Monitor Na levels to avoid over aggressive correction. Check TSH.  If no improvement with IVF will initiate further work up for SIADH if appropriate.   . Lung cancer, Right upper lobe - chronic stable Other plan as per  orders.  DVT prophylaxis:  SCD   Code Status:  FULL CODE   as per patient    Family Communication:   Family  at  Bedside  plan of care was discussed with  Husband   Disposition Plan:     likely will need placement for rehabilitation                                                  Would benefit from PT/OT eval prior to DC  please order when stable                       Social Work                            Consults called: Orthopedics Dr. Erlinda [name]   Admission status:  inpatient       Level of care     tele         I have spent a total of 56 min on this admission   [name] 09/20/2017, 1:00 AM    [facility]  Pager  [phone]   after 2 AM please page floor coverage PA If 7AM-7PM, please contact the day team taking care of the patient  [email or web address]  Password [email or web address]

## 2017-09-20 NOTE — ED Notes (Signed)
Patient transported to X-ray 

## 2017-09-20 NOTE — ED Triage Notes (Addendum)
Pt arrives to trauma bay from triage after near syncopal episode in the waiting room. Pt pale and diaphoretic, MD Knapp at bedside. Reports L leg pain and back pain.

## 2017-09-20 NOTE — ED Notes (Addendum)
Purewick put in place

## 2017-09-20 NOTE — ED Provider Notes (Signed)
[facility] EMERGENCY DEPARTMENT Provider Note   CSN: [id number] Arrival date & time: 09/20/17  2014     History   Chief Complaint Chief Complaint  Patient presents with  . Fall  . Near Syncope    HPI [name] is a 72 y.o. female.  HPI Patient presents to the emergency room for evaluation of back and knee pain after a fall.  Patient was walking when she slipped and fell landing on concrete.  She injured her lower back and had her left lower leg bent behind her.  Patient is having severe pain in her lower back as well as her left knee.  She states like her lower leg is numb.  She denies any headache or head injury.  No loss of consciousness.  No chest pain.  No abdominal pain.  No pain in her upper extremities or her right leg. Past Medical History:  Diagnosis Date  . Arthritis    back   . Cavitating mass in right upper lung lobe   . CKD (chronic kidney disease) stage 3, GFR 30-59 ml/min (HCC) 03/12/2015  . Diverticulosis   . Dizziness    occasionally   . Family history of kidney infection    pt history-saw Dr.Wrenn  . GERD (gastroesophageal reflux disease)    takes Nexium daily  . History of colon polyps   . Hyperlipidemia    takes Zocor daily  . Hypertension    takes Amlodipine daily and Chlorthalidone as well  . Lung cancer, Right upper lobe   . Lung nodules    right upper lobe  . Nocturia   . Pleural effusion on right 04/05/2017    Patient Active Problem List   Diagnosis Date Noted  . Pleural effusion on right 04/05/2017  . Aortic atherosclerosis ([place])   . Syncope 05/10/2016  . Restless legs syndrome 05/10/2016  . History of lung cancer 07/09/2013  . Hyperlipidemia   . Hypertensive heart disease without CHF   . Lung cancer, Right upper lobe     Past Surgical History:  Procedure Laterality Date  . ABDOMINAL HYSTERECTOMY     at age 68;partial   . CARDIOVASCULAR STRESS TEST  06/29/2009   EF 79%, NO ISCHEMIA  . COLONOSCOPY    .  IR THORACENTESIS ASP PLEURAL SPACE W/IMG GUIDE  04/25/2017  . TRACHEOSTOMY     at age 40 or 5 d/t polyps on vocal cord  . VIDEO ASSISTED THORACOSCOPY (VATS)/WEDGE RESECTION Right 07/08/2013   Procedure: VIDEO ASSISTED THORACOSCOPY (VATS)/WEDGE RESECTION;  Surgeon: [name], MD;  Location: [facility];  Service: Thoracic;  Laterality: Right;  Marland Kitchen VIDEO BRONCHOSCOPY N/A 07/08/2013   Procedure: VIDEO BRONCHOSCOPY;  Surgeon: [name], MD;  Location: [facility] OR;  Service: Thoracic;  Laterality: N/A;    OB History    No data available       Home Medications    Prior to Admission medications   Medication Sig Start Date End Date Taking? Authorizing Provider  esomeprazole (NEXIUM) 40 MG capsule Take 40 mg by mouth daily before breakfast.    [provider]  furosemide (LASIX) 20 MG tablet Take 1 tablet (20 mg total) by mouth daily. Patient taking differently: Take 20 mg by mouth 3 (three) times a week.  05/13/16   Hongalgi, Lenis Dickinson, MD  KLOR-CON 10 10 MEQ tablet Take 10 mEq by mouth 3 (three) times a week.  06/25/15   [provider]  LORazepam (ATIVAN) 1 MG tablet  Take 1 mg by mouth daily. 04/15/16   [provider]  losartan (COZAAR) 25 MG tablet Take 2 tablets (50 mg total) by mouth daily. 05/13/16   Hongalgi, Lenis Dickinson, MD  metoprolol succinate (TOPROL-XL) 50 MG 24 hr tablet Take 50 mg by mouth daily. Take with or immediately following a meal.    [provider]  Multiple Vitamin (MULTIVITAMIN WITH MINERALS) TABS tablet Take 1 tablet by mouth daily. 05/14/16   Hongalgi, Lenis Dickinson, MD  rOPINIRole (REQUIP) 0.25 MG tablet Take 0.5 mg by mouth at bedtime.  01/06/16   [provider]  simvastatin (ZOCOR) 20 MG tablet Take 20 mg by mouth every evening.    [provider]    Family History Family History  Problem Relation Age of Onset  . Heart attack Father   . Cancer Brother        stomach    Social History Social History   Tobacco Use  .  Smoking status: Former Smoker    Types: Cigarettes    Last attempt to quit: 08/08/1978    Years since quitting: 39.1  . Smokeless tobacco: Never Used  . Tobacco comment: quit 35 yrs ago  Substance Use Topics  . Alcohol use: Yes    Comment: several beers a day and shots of crown  . Drug use: No     Allergies   Thiazide-type diuretics   Review of Systems Review of Systems  All other systems reviewed and are negative.    Physical Exam Updated Vital Signs BP (!) 176/67   Pulse (!) 55   Temp (!) 96.7 F (35.9 C) (Temporal)   Resp (!) 9   SpO2 100%   Physical Exam  Constitutional: She appears well-developed and well-nourished. She appears distressed.  HENT:  Head: Normocephalic and atraumatic.  Right Ear: External ear normal.  Left Ear: External ear normal.  Eyes: Conjunctivae are normal. Right eye exhibits no discharge. Left eye exhibits no discharge. No scleral icterus.  Neck: Neck supple. No tracheal deviation present.  Cardiovascular: Normal rate, regular rhythm and intact distal pulses.  Pulmonary/Chest: Effort normal and breath sounds normal. No stridor. No respiratory distress. She has no wheezes. She has no rales.  Abdominal: Soft. Bowel sounds are normal. She exhibits no distension. There is no tenderness. There is no rebound and no guarding.  Musculoskeletal: She exhibits no edema.       Right shoulder: Normal.       Left shoulder: Normal.       Right wrist: Normal.       Left wrist: Normal.       Right hip: Normal.       Left hip: Normal.       Right knee: Normal.       Left knee: She exhibits no effusion and no deformity. Tenderness found.       Right ankle: Normal.       Left ankle: Normal.       Cervical back: Normal.       Thoracic back: She exhibits tenderness and bony tenderness. She exhibits no swelling, no edema and no deformity.       Lumbar back: She exhibits tenderness and bony tenderness. She exhibits no swelling, no edema and no deformity.    Neurological: She is alert. She has normal strength. No cranial nerve deficit (no facial droop, extraocular movements intact, no slurred speech) or sensory deficit. She exhibits normal muscle tone. She displays no seizure activity. Coordination normal.  Skin: Skin is warm and dry. No rash noted. She is not diaphoretic.  Psychiatric: She has a normal mood and affect.  Nursing note and vitals reviewed.    ED Treatments / Results  Labs (all labs ordered are listed, but only abnormal results are displayed) Labs Reviewed  CBC - Abnormal; Notable for the following components:      Result Value   WBC 12.5 (*)    All other components within normal limits  BASIC METABOLIC PANEL - Abnormal; Notable for the following components:   Sodium 128 (*)    Potassium 3.4 (*)    Chloride 93 (*)    CO2 19 (*)    Glucose, Bld 130 (*)    Creatinine, Ser 1.25 (*)    GFR calc non Af Amer 42 (*)    GFR calc Af Amer 49 (*)    Anion gap 16 (*)    All other components within normal limits    EKG Cardiac monitor: Sinus rhythm. HR 52, narrow qrs  Radiology Dg Chest 1 View  Result Date: 09/20/2017 CLINICAL DATA:  Slipped and fell on water at restaurant. EXAM: CHEST 1 VIEW COMPARISON:  Chest radiograph June 01, 2017 FINDINGS: Cardiac silhouette is mildly enlarged and unchanged. Mildly calcified aortic knob. Chronic RIGHT lung base pleural thickening and scarring. No focal consolidation. No pneumothorax. Included osseous structures are non suspicious. IMPRESSION: Stable cardiomegaly and RIGHT lung base pleural thickening/scarring. Electronically Signed   By: Elon Alas M.D.   On: 09/20/2017 22:19   Dg Thoracic Spine 2 View  Result Date: 09/20/2017 CLINICAL DATA:  72 year old female status post slip and fall at rest trauma. Pain. Personal history of lung cancer status post right upper lobectomy. EXAM: THORACIC SPINE 2 VIEWS COMPARISON:  Chest CT 07/06/2017 FINDINGS: Thoracic segmentation appears  normal. There is levoconvex scoliosis of the lower thoracic spine in conjunction with lumbar scoliosis, resulting in some rotation of the lower thoracic vertebrae. Superimposed flowing lower thoracic endplate osteophytes. T9 butterfly vertebra, more apparent on the comparison CT. Cervicothoracic junction alignment is within normal limits. Stable thoracic vertebral height and alignment. Posterior ribs appear intact. Visualized thoracic visceral contours are stable. IMPRESSION: No acute osseous abnormality identified in the thoracic spine. Electronically Signed   By: Genevie Ann M.D.   On: 09/20/2017 22:20   Dg Lumbar Spine Complete  Result Date: 09/20/2017 CLINICAL DATA:  72 year old female status post slip and fall at restaurant. Pain. EXAM: LUMBAR SPINE - COMPLETE 4+ VIEW COMPARISON:  Lumbar radiographs 09/20/2015. FINDINGS: Chronic moderate to severe lumbar scoliosis with a rotatory component. Stable lumbar vertebral height and alignment since 2017. Associated chronic severe lumbar disc space loss and bulky endplate osteophytosis. Stable visible sacrum and SI joints. Visible lower thoracic levels appear stable. No acute osseous abnormality identified. Negative visible bowel gas pattern. Calcified aortic atherosclerosis. IMPRESSION: 1.  No acute osseous abnormality identified in the lumbar spine. 2. Chronic severe lumbar scoliosis, disc, and endplate degeneration. Electronically Signed   By: Genevie Ann M.D.   On: 09/20/2017 22:14   Dg Knee Complete 4 Views Left  Result Date: 09/20/2017 CLINICAL DATA:  72 year old female slipped and fell at restaurant with severe left knee pain. EXAM: LEFT KNEE - COMPLETE 4+ VIEW COMPARISON:  None. FINDINGS: No straight AP view of the left knee is provided. Comminuted nondisplaced fracture of the left fibula head is suspected. Comminuted fracture of the proximal left tibia is identified and is associated with depression of the left lateral tibial plateau. The  medial plateau appears  to remain intact. The patella appears intact. The distal left femur appears intact. Only a small joint effusion is evident. IMPRESSION: 1. Comminuted fracture of the proximal left tibia with depressed lateral tibial plateau. 2. Nondisplaced comminuted fracture of the left fibula head. Electronically Signed   By: Genevie Ann M.D.   On: 09/20/2017 22:17    Procedures Procedures (including critical care time)  Medications Ordered in ED Medications  HYDROmorphone (DILAUDID) injection 1 mg (1 mg Intravenous Given 09/20/17 2123)  sodium chloride 0.9 % bolus 1,000 mL (1,000 mLs Intravenous New Bag/Given 09/20/17 2116)     Initial Impression / Assessment and Plan / ED Course  I have reviewed the triage vital signs and the nursing notes.  Pertinent labs & imaging results that were available during my care of the patient were reviewed by me and considered in my medical decision making (see chart for details).  Clinical Course as of Sep 20 2324  Wed Sep 20, 2017  2106 I was called to the bedside because the patient had an episode of near syncope.  Patient is alert and awake.  I suspect she had a vasovagal episode  [JK]  2312 D/w Dr Erlinda Hong.  Requests medical admission.  Knee imobilizer.  Will evaluate tomorrow for possible surgery  [JK]    Clinical Course User Index [JK] Dorie Rank, MD    Final Clinical Impressions(s) / ED Diagnoses   Final diagnoses:  Closed fracture of left tibial plateau, initial encounter     Dorie Rank, MD 09/20/17 2329

## 2017-09-21 ENCOUNTER — Emergency Department (HOSPITAL_COMMUNITY): Payer: Medicare Other

## 2017-09-21 ENCOUNTER — Other Ambulatory Visit: Payer: Self-pay

## 2017-09-21 ENCOUNTER — Inpatient Hospital Stay (HOSPITAL_COMMUNITY): Payer: Medicare Other

## 2017-09-21 DIAGNOSIS — I361 Nonrheumatic tricuspid (valve) insufficiency: Secondary | ICD-10-CM

## 2017-09-21 DIAGNOSIS — R55 Syncope and collapse: Secondary | ICD-10-CM

## 2017-09-21 DIAGNOSIS — W010XXA Fall on same level from slipping, tripping and stumbling without subsequent striking against object, initial encounter: Secondary | ICD-10-CM | POA: Diagnosis present

## 2017-09-21 DIAGNOSIS — Z79899 Other long term (current) drug therapy: Secondary | ICD-10-CM | POA: Diagnosis not present

## 2017-09-21 DIAGNOSIS — F1020 Alcohol dependence, uncomplicated: Secondary | ICD-10-CM | POA: Diagnosis present

## 2017-09-21 DIAGNOSIS — K219 Gastro-esophageal reflux disease without esophagitis: Secondary | ICD-10-CM | POA: Diagnosis present

## 2017-09-21 DIAGNOSIS — E876 Hypokalemia: Secondary | ICD-10-CM | POA: Diagnosis not present

## 2017-09-21 DIAGNOSIS — J9601 Acute respiratory failure with hypoxia: Secondary | ICD-10-CM | POA: Diagnosis not present

## 2017-09-21 DIAGNOSIS — E860 Dehydration: Secondary | ICD-10-CM | POA: Diagnosis present

## 2017-09-21 DIAGNOSIS — I5032 Chronic diastolic (congestive) heart failure: Secondary | ICD-10-CM | POA: Diagnosis present

## 2017-09-21 DIAGNOSIS — S82143A Displaced bicondylar fracture of unspecified tibia, initial encounter for closed fracture: Secondary | ICD-10-CM | POA: Diagnosis present

## 2017-09-21 DIAGNOSIS — S72432A Displaced fracture of medial condyle of left femur, initial encounter for closed fracture: Secondary | ICD-10-CM | POA: Diagnosis present

## 2017-09-21 DIAGNOSIS — Z90710 Acquired absence of both cervix and uterus: Secondary | ICD-10-CM | POA: Diagnosis not present

## 2017-09-21 DIAGNOSIS — C3412 Malignant neoplasm of upper lobe, left bronchus or lung: Secondary | ICD-10-CM

## 2017-09-21 DIAGNOSIS — Z85118 Personal history of other malignant neoplasm of bronchus and lung: Secondary | ICD-10-CM | POA: Diagnosis not present

## 2017-09-21 DIAGNOSIS — Z9221 Personal history of antineoplastic chemotherapy: Secondary | ICD-10-CM | POA: Diagnosis not present

## 2017-09-21 DIAGNOSIS — E871 Hypo-osmolality and hyponatremia: Secondary | ICD-10-CM | POA: Diagnosis present

## 2017-09-21 DIAGNOSIS — T148XXA Other injury of unspecified body region, initial encounter: Secondary | ICD-10-CM | POA: Diagnosis not present

## 2017-09-21 DIAGNOSIS — S82832A Other fracture of upper and lower end of left fibula, initial encounter for closed fracture: Secondary | ICD-10-CM | POA: Diagnosis present

## 2017-09-21 DIAGNOSIS — C3411 Malignant neoplasm of upper lobe, right bronchus or lung: Secondary | ICD-10-CM | POA: Diagnosis not present

## 2017-09-21 DIAGNOSIS — E785 Hyperlipidemia, unspecified: Secondary | ICD-10-CM | POA: Diagnosis present

## 2017-09-21 DIAGNOSIS — M79662 Pain in left lower leg: Secondary | ICD-10-CM | POA: Diagnosis present

## 2017-09-21 DIAGNOSIS — I130 Hypertensive heart and chronic kidney disease with heart failure and stage 1 through stage 4 chronic kidney disease, or unspecified chronic kidney disease: Secondary | ICD-10-CM | POA: Diagnosis present

## 2017-09-21 DIAGNOSIS — Z87891 Personal history of nicotine dependence: Secondary | ICD-10-CM | POA: Diagnosis not present

## 2017-09-21 DIAGNOSIS — Y9301 Activity, walking, marching and hiking: Secondary | ICD-10-CM | POA: Diagnosis present

## 2017-09-21 DIAGNOSIS — S83262A Peripheral tear of lateral meniscus, current injury, left knee, initial encounter: Secondary | ICD-10-CM | POA: Diagnosis present

## 2017-09-21 DIAGNOSIS — D631 Anemia in chronic kidney disease: Secondary | ICD-10-CM | POA: Diagnosis present

## 2017-09-21 DIAGNOSIS — N182 Chronic kidney disease, stage 2 (mild): Secondary | ICD-10-CM | POA: Diagnosis present

## 2017-09-21 DIAGNOSIS — R42 Dizziness and giddiness: Secondary | ICD-10-CM

## 2017-09-21 DIAGNOSIS — Y92511 Restaurant or cafe as the place of occurrence of the external cause: Secondary | ICD-10-CM | POA: Diagnosis not present

## 2017-09-21 DIAGNOSIS — J449 Chronic obstructive pulmonary disease, unspecified: Secondary | ICD-10-CM | POA: Diagnosis present

## 2017-09-21 DIAGNOSIS — Z902 Acquired absence of lung [part of]: Secondary | ICD-10-CM | POA: Diagnosis not present

## 2017-09-21 DIAGNOSIS — S82142A Displaced bicondylar fracture of left tibia, initial encounter for closed fracture: Secondary | ICD-10-CM | POA: Diagnosis present

## 2017-09-21 LAB — URINALYSIS, ROUTINE W REFLEX MICROSCOPIC
BACTERIA UA: NONE SEEN
Bilirubin Urine: NEGATIVE
Glucose, UA: NEGATIVE mg/dL
Ketones, ur: NEGATIVE mg/dL
Leukocytes, UA: NEGATIVE
Nitrite: NEGATIVE
PH: 5 (ref 5.0–8.0)
Protein, ur: NEGATIVE mg/dL
RBC / HPF: NONE SEEN RBC/hpf (ref 0–5)
SPECIFIC GRAVITY, URINE: 1.008 (ref 1.005–1.030)
Squamous Epithelial / LPF: NONE SEEN

## 2017-09-21 LAB — SODIUM, URINE, RANDOM: SODIUM UR: 122 mmol/L

## 2017-09-21 LAB — COMPREHENSIVE METABOLIC PANEL
ALT: 21 U/L (ref 14–54)
AST: 28 U/L (ref 15–41)
Albumin: 4.1 g/dL (ref 3.5–5.0)
Alkaline Phosphatase: 97 U/L (ref 38–126)
Anion gap: 12 (ref 5–15)
BUN: 12 mg/dL (ref 6–20)
CHLORIDE: 94 mmol/L — AB (ref 101–111)
CO2: 23 mmol/L (ref 22–32)
Calcium: 8.7 mg/dL — ABNORMAL LOW (ref 8.9–10.3)
Creatinine, Ser: 1 mg/dL (ref 0.44–1.00)
GFR, EST NON AFRICAN AMERICAN: 55 mL/min — AB (ref >60)
Glucose, Bld: 148 mg/dL — ABNORMAL HIGH (ref 65–99)
POTASSIUM: 3.8 mmol/L (ref 3.5–5.1)
Sodium: 129 mmol/L — ABNORMAL LOW (ref 135–145)
TOTAL PROTEIN: 6.3 g/dL — AB (ref 6.5–8.1)
Total Bilirubin: 0.7 mg/dL (ref 0.3–1.2)

## 2017-09-21 LAB — CBC WITH DIFFERENTIAL/PLATELET
BASOS ABS: 0 10*3/uL (ref 0.0–0.1)
Basophils Relative: 0 %
Eosinophils Absolute: 0 10*3/uL (ref 0.0–0.7)
Eosinophils Relative: 0 %
HCT: 34.2 % — ABNORMAL LOW (ref 36.0–46.0)
HEMOGLOBIN: 11.8 g/dL — AB (ref 12.0–15.0)
LYMPHS ABS: 1.2 10*3/uL (ref 0.7–4.0)
LYMPHS PCT: 11 %
MCH: 33 pg (ref 26.0–34.0)
MCHC: 34.5 g/dL (ref 30.0–36.0)
MCV: 95.5 fL (ref 78.0–100.0)
Monocytes Absolute: 0.5 10*3/uL (ref 0.1–1.0)
Monocytes Relative: 4 %
Neutro Abs: 8.7 10*3/uL — ABNORMAL HIGH (ref 1.7–7.7)
Neutrophils Relative %: 85 %
Platelets: 209 10*3/uL (ref 150–400)
RBC: 3.58 MIL/uL — AB (ref 3.87–5.11)
RDW: 12.8 % (ref 11.5–15.5)
WBC: 10.4 10*3/uL (ref 4.0–10.5)

## 2017-09-21 LAB — MAGNESIUM: Magnesium: 1.6 mg/dL — ABNORMAL LOW (ref 1.7–2.4)

## 2017-09-21 LAB — TROPONIN I
Troponin I: <0.03 ng/mL (ref <0.03)
Troponin I: <0.03 ng/mL (ref <0.03)

## 2017-09-21 LAB — ECHOCARDIOGRAM COMPLETE
Height: 65 in
Weight: 2480 oz

## 2017-09-21 LAB — LACTIC ACID, PLASMA: Lactic Acid, Venous: 1.4 mmol/L (ref 0.5–1.9)

## 2017-09-21 LAB — TSH: TSH: 2.158 u[IU]/mL (ref 0.350–4.500)

## 2017-09-21 LAB — CREATININE, URINE, RANDOM: CREATININE, URINE: 24.06 mg/dL

## 2017-09-21 LAB — OSMOLALITY, URINE: Osmolality, Ur: 356 mOsm/kg (ref 300–900)

## 2017-09-21 LAB — TYPE AND SCREEN
ABO/RH(D): O POS
ANTIBODY SCREEN: NEGATIVE

## 2017-09-21 LAB — SURGICAL PCR SCREEN
MRSA, PCR: NEGATIVE
Staphylococcus aureus: NEGATIVE

## 2017-09-21 LAB — CBG MONITORING, ED: GLUCOSE-CAPILLARY: 163 mg/dL — AB (ref 65–99)

## 2017-09-21 LAB — T4, FREE: Free T4: 1.24 ng/dL — ABNORMAL HIGH (ref 0.61–1.12)

## 2017-09-21 MED ORDER — POTASSIUM CHLORIDE 10 MEQ/100ML IV SOLN
10.0000 meq | INTRAVENOUS | Status: AC
  Administered 2017-09-21 (×3): 10 meq via INTRAVENOUS
  Filled 2017-09-21 (×3): qty 100

## 2017-09-21 MED ORDER — LORAZEPAM 2 MG/ML IJ SOLN
1.0000 mg | Freq: Four times a day (QID) | INTRAMUSCULAR | Status: AC | PRN
  Administered 2017-09-21: 1 mg via INTRAVENOUS
  Filled 2017-09-21: qty 1

## 2017-09-21 MED ORDER — HYDROCODONE-ACETAMINOPHEN 5-325 MG PO TABS
1.0000 | ORAL_TABLET | Freq: Four times a day (QID) | ORAL | Status: DC | PRN
  Administered 2017-09-21 – 2017-09-22 (×3): 2 via ORAL
  Administered 2017-09-22: 1 via ORAL
  Administered 2017-09-23 (×2): 2 via ORAL
  Administered 2017-09-23 (×2): 1 via ORAL
  Administered 2017-09-24 – 2017-09-26 (×6): 2 via ORAL
  Filled 2017-09-21 (×11): qty 2
  Filled 2017-09-21: qty 1
  Filled 2017-09-21 (×3): qty 2

## 2017-09-21 MED ORDER — SODIUM CHLORIDE 0.9 % IV SOLN
INTRAVENOUS | Status: DC
  Administered 2017-09-21: 03:00:00 via INTRAVENOUS

## 2017-09-21 MED ORDER — LOSARTAN POTASSIUM 50 MG PO TABS: 50.0000 mg | ORAL_TABLET | Freq: Every day | ORAL | Status: DC

## 2017-09-21 MED ORDER — POTASSIUM CHLORIDE IN NACL 20-0.9 MEQ/L-% IV SOLN
INTRAVENOUS | Status: DC
  Administered 2017-09-21 – 2017-09-23 (×3): via INTRAVENOUS
  Filled 2017-09-21 (×5): qty 1000

## 2017-09-21 MED ORDER — SODIUM CHLORIDE 0.9% FLUSH
3.0000 mL | Freq: Two times a day (BID) | INTRAVENOUS | Status: DC
  Administered 2017-09-25 – 2017-09-26 (×2): 3 mL via INTRAVENOUS

## 2017-09-21 MED ORDER — HYDROMORPHONE HCL 1 MG/ML IJ SOLN
1.0000 mg | Freq: Once | INTRAMUSCULAR | Status: AC
  Administered 2017-09-21: 1 mg via INTRAVENOUS
  Filled 2017-09-21: qty 1

## 2017-09-21 MED ORDER — ONDANSETRON HCL 4 MG/2ML IJ SOLN
4.0000 mg | Freq: Four times a day (QID) | INTRAMUSCULAR | Status: DC | PRN
  Administered 2017-09-21 – 2017-09-24 (×6): 4 mg via INTRAVENOUS
  Filled 2017-09-21 (×6): qty 2

## 2017-09-21 MED ORDER — PANTOPRAZOLE SODIUM 40 MG PO TBEC
40.0000 mg | DELAYED_RELEASE_TABLET | Freq: Every day | ORAL | Status: DC
  Administered 2017-09-21 – 2017-09-26 (×6): 40 mg via ORAL
  Filled 2017-09-21 (×6): qty 1

## 2017-09-21 MED ORDER — ENOXAPARIN SODIUM 40 MG/0.4ML ~~LOC~~ SOLN
40.0000 mg | SUBCUTANEOUS | Status: AC
  Administered 2017-09-21: 40 mg via SUBCUTANEOUS
  Filled 2017-09-21: qty 0.4

## 2017-09-21 MED ORDER — MORPHINE SULFATE (PF) 4 MG/ML IV SOLN
0.5000 mg | INTRAVENOUS | Status: DC | PRN
  Administered 2017-09-21: 0.52 mg via INTRAVENOUS
  Filled 2017-09-21: qty 1

## 2017-09-21 MED ORDER — SIMVASTATIN 20 MG PO TABS
20.0000 mg | ORAL_TABLET | Freq: Every evening | ORAL | Status: DC
  Administered 2017-09-22 – 2017-09-25 (×4): 20 mg via ORAL
  Filled 2017-09-21 (×5): qty 1

## 2017-09-21 MED ORDER — METOPROLOL SUCCINATE ER 25 MG PO TB24: 50.0000 mg | ORAL_TABLET | Freq: Every day | ORAL | Status: DC

## 2017-09-21 MED ORDER — MAGNESIUM SULFATE IN D5W 1-5 GM/100ML-% IV SOLN
1.0000 g | Freq: Once | INTRAVENOUS | Status: AC
  Administered 2017-09-21: 1 g via INTRAVENOUS
  Filled 2017-09-21: qty 100

## 2017-09-21 MED ORDER — METHOCARBAMOL 500 MG PO TABS
500.0000 mg | ORAL_TABLET | Freq: Four times a day (QID) | ORAL | Status: DC | PRN
  Administered 2017-09-22 – 2017-09-26 (×11): 500 mg via ORAL
  Filled 2017-09-21 (×11): qty 1

## 2017-09-21 MED ORDER — ADULT MULTIVITAMIN W/MINERALS CH
1.0000 | ORAL_TABLET | Freq: Every day | ORAL | Status: DC
  Administered 2017-09-21 – 2017-09-26 (×5): 1 via ORAL
  Filled 2017-09-21 (×5): qty 1

## 2017-09-21 MED ORDER — LORAZEPAM 1 MG PO TABS: 1.0000 mg | ORAL_TABLET | Freq: Four times a day (QID) | ORAL | Status: AC | PRN

## 2017-09-21 MED ORDER — DEXTROSE 5 % IV SOLN
500.0000 mg | Freq: Four times a day (QID) | INTRAVENOUS | Status: DC | PRN
  Filled 2017-09-21: qty 5

## 2017-09-21 MED ORDER — VITAMIN B-1 100 MG PO TABS
100.0000 mg | ORAL_TABLET | Freq: Every day | ORAL | Status: DC
  Administered 2017-09-23 – 2017-09-26 (×4): 100 mg via ORAL
  Filled 2017-09-21 (×4): qty 1

## 2017-09-21 MED ORDER — METOPROLOL SUCCINATE ER 50 MG PO TB24
50.0000 mg | ORAL_TABLET | Freq: Every day | ORAL | Status: DC
  Administered 2017-09-21 – 2017-09-26 (×6): 50 mg via ORAL
  Filled 2017-09-21 (×5): qty 1
  Filled 2017-09-21: qty 2

## 2017-09-21 MED ORDER — ROPINIROLE HCL 1 MG PO TABS
0.5000 mg | ORAL_TABLET | Freq: Every day | ORAL | Status: DC
  Administered 2017-09-22 – 2017-09-25 (×4): 0.5 mg via ORAL
  Filled 2017-09-21 (×4): qty 1

## 2017-09-21 MED ORDER — THIAMINE HCL 100 MG/ML IJ SOLN
100.0000 mg | Freq: Every day | INTRAMUSCULAR | Status: DC
  Administered 2017-09-21: 100 mg via INTRAVENOUS
  Filled 2017-09-21 (×2): qty 2

## 2017-09-21 MED ORDER — LOSARTAN POTASSIUM 50 MG PO TABS
50.0000 mg | ORAL_TABLET | Freq: Every day | ORAL | Status: DC
  Administered 2017-09-21 – 2017-09-25 (×4): 50 mg via ORAL
  Filled 2017-09-21 (×4): qty 1

## 2017-09-21 MED ORDER — CEFAZOLIN SODIUM-DEXTROSE 2-4 GM/100ML-% IV SOLN: 2.0000 g | INTRAVENOUS | Status: DC

## 2017-09-21 MED ORDER — FOLIC ACID 1 MG PO TABS
1.0000 mg | ORAL_TABLET | Freq: Every day | ORAL | Status: DC
  Administered 2017-09-21 – 2017-09-26 (×5): 1 mg via ORAL
  Filled 2017-09-21 (×5): qty 1

## 2017-09-21 NOTE — Progress Notes (Signed)
Orthopedic Tech Progress Note Patient Details:  [name] [facility] [birth year] [id number]  Ortho Devices Type of Ortho Device: Knee Immobilizer Ortho Device/Splint Location: lle Ortho Device/Splint Interventions: Ordered, Application, Adjustment   Post Interventions Patient Tolerated: Well Instructions Provided: Care of device, Adjustment of device   [name] 09/21/2017, 1:46 AM

## 2017-09-21 NOTE — H&P (View-Only) (Signed)
Orthopaedic Trauma Service (OTS) Consult   Patient ID: [name] MRN: [id number] DOB/AGE: 1946/02/19 72 y.o.  Reason for Consult: Left tibial plateau fracture Referring Physician: Dr. Dorie Rank, MD [facility] Emergency Room  HPI: [name] is an 72 y.o. female who is being seen in consultation at the request of Dr. Tomi Bamberger for evaluation of left tibial plateau fracture. Patient has history of COPD, lung cancer, chronic kidney disease that fell while walking yesterday evening. She had immediate pain and inability to bear weight. She was carried to the car and taken to the ER where x-rays showed a left tibial plateau fracture. CT scan was obtained. Dr. Erlinda Hong who was on call asked that our service take over her care due to the complexity of the fracture and need for an orthopaedic traumatologist. Currently the patient is complaining of knee and back pain. She has not gotten out of bed. Knee immobilizer was placed earlier this AM. Denies pain to other leg or bilateral upper extremities.  Past Medical History:  Diagnosis Date  . Arthritis    back   . Cavitating mass in right upper lung lobe   . CKD (chronic kidney disease) stage 3, GFR 30-59 ml/min (HCC) 03/12/2015  . Diverticulosis   . Dizziness    occasionally   . Family history of kidney infection    pt history-saw Dr.Wrenn  . GERD (gastroesophageal reflux disease)    takes Nexium daily  . History of colon polyps   . Hyperlipidemia    takes Zocor daily  . Hypertension    takes Amlodipine daily and Chlorthalidone as well  . Lung cancer, Right upper lobe   . Lung nodules    right upper lobe  . Nocturia   . Pleural effusion on right 04/05/2017    Past Surgical History:  Procedure Laterality Date  . ABDOMINAL HYSTERECTOMY     at age 77;partial   . CARDIOVASCULAR STRESS TEST  06/29/2009   EF 79%, NO ISCHEMIA  . COLONOSCOPY    . IR THORACENTESIS ASP PLEURAL SPACE W/IMG GUIDE  04/25/2017  . TRACHEOSTOMY     at age 59 or 5 d/t polyps on  vocal cord  . VIDEO ASSISTED THORACOSCOPY (VATS)/WEDGE RESECTION Right 07/08/2013   Procedure: VIDEO ASSISTED THORACOSCOPY (VATS)/WEDGE RESECTION;  Surgeon: [name], MD;  Location: [facility];  Service: Thoracic;  Laterality: Right;  Marland Kitchen VIDEO BRONCHOSCOPY N/A 07/08/2013   Procedure: VIDEO BRONCHOSCOPY;  Surgeon: [name], MD;  Location: [facility] OR;  Service: Thoracic;  Laterality: N/A;    Family History  Problem Relation Age of Onset  . Heart attack Father   . Cancer Brother        stomach    Social History:  reports that she quit smoking about 39 years ago. Her smoking use included cigarettes. she has never used smokeless tobacco. She reports that she drinks alcohol. She reports that she does not use drugs.  Allergies:  Allergies  Allergen Reactions  . Thiazide-Type Diuretics     Hyponatremia     Medications:  No current facility-administered medications on file prior to encounter.    Current Outpatient Medications on File Prior to Encounter  Medication Sig Dispense Refill  . esomeprazole (NEXIUM) 40 MG capsule Take 40 mg by mouth daily before breakfast.    . furosemide (LASIX) 20 MG tablet Take 1 tablet (20 mg total) by mouth daily. (Patient taking differently: Take 20 mg by mouth 3 (three) times a week. ) 15 tablet 0  .  KLOR-CON 10 10 MEQ tablet Take 10 mEq by mouth 3 (three) times a week.   4  . LORazepam (ATIVAN) 1 MG tablet Take 1 mg by mouth daily.    Marland Kitchen losartan (COZAAR) 25 MG tablet Take 2 tablets (50 mg total) by mouth daily. 60 tablet 0  . metoprolol succinate (TOPROL-XL) 50 MG 24 hr tablet Take 50 mg by mouth daily. Take with or immediately following a meal.    . Multiple Vitamin (MULTIVITAMIN WITH MINERALS) TABS tablet Take 1 tablet by mouth daily.    Marland Kitchen rOPINIRole (REQUIP) 0.25 MG tablet Take 0.5 mg by mouth at bedtime.     . simvastatin (ZOCOR) 20 MG tablet Take 20 mg by mouth every evening.      ROS: Constitutional: No fever or chills Vision: No changes in  vision ENT: No difficulty swallowing CV: No chest pain Pulm: No SOB or wheezing GI: No nausea or vomiting GU: No urgency or inability to hold urine Skin: No poor wound healing Neurologic: No numbness or tingling Psychiatric: No depression or anxiety Heme: No bruising Allergic: No reaction to medications or food   Exam: Blood pressure (!) 158/88, pulse (!) 59, temperature 98.4 F (36.9 C), temperature source Oral, resp. rate 10, SpO2 95 %. General:No acute distress Orientation:Awake alert and oriented x3 Mood and Affect: Cooperative and pleasant Gait: Unable to be assessed due to fracture Coordination and balance: Within normal limits  Left lower extremity: Knee immobilizer in place. Removed for exam. Unable to bend knee due to pain. Valgus alignment. +effusion. Skin wrinkles over anterolateral knee/proximal tibia. Compartments are soft and compressible. No pain at ankle with full painless ROM of ankle. Neuro intact to all nerve distributions. 2+DP pulse. No lymphadenopathy and normal reflexes.  Bilateral upper extremities and right lower extremity: Skin without lesions. No tenderness to palpation. Full painless ROM, full strength in each muscle groups without evidence of instability.   Medical Decision Making: Imaging: X-rays reviewed along with CT scan. Show tibial plateau fracture with severe depression of lateral joint. Nondisplaced fracture along medial plateau. Avulsion fracture off of medial femoral condyle consistent with MCL avulsion injury.  Labs:  CBC    Component Value Date/Time   WBC 10.4 09/21/2017 0337   RBC 3.58 (L) 09/21/2017 0337   HGB 11.8 (L) 09/21/2017 0337   HGB 13.3 07/06/2017 0832   HCT 34.2 (L) 09/21/2017 0337   HCT 38.8 07/06/2017 0832   PLT 209 09/21/2017 0337   PLT 227 07/06/2017 0832   MCV 95.5 09/21/2017 0337   MCV 93.3 07/06/2017 0832   MCH 33.0 09/21/2017 0337   MCHC 34.5 09/21/2017 0337   RDW 12.8 09/21/2017 0337   RDW 13.6 07/06/2017  0832   LYMPHSABS 1.2 09/21/2017 0337   LYMPHSABS 2.3 07/06/2017 0832   MONOABS 0.5 09/21/2017 0337   MONOABS 0.7 07/06/2017 0832   EOSABS 0.0 09/21/2017 0337   EOSABS 0.4 07/06/2017 0832   BASOSABS 0.0 09/21/2017 0337   BASOSABS 0.0 07/06/2017 0832    Medical history and chart was reviewed  Assessment/Plan: 72 year old female with history of COPD and lung cancer with syncope and left tibial plateau fracture  Discussed with patient that the fracture requires operative treatment. Feel that ORIF would be appropriate and plan to perform surgery tomorrow morning. Will need further workup per hospitalist service include Echo today. Discussed risks and benefits of surgical intervention. Risks discussed included bleeding requiring blood transfusion, bleeding causing a hematoma, infection, malunion, nonunion, damage to surrounding nerves  and blood vessels, pain, hardware prominence or irritation, hardware failure, stiffness, post-traumatic arthritis, DVT/PE, compartment syndrome, and even death. Patient agrees to proceed with surgery.  Recommendations: -NWB LLE, maintain knee immobilizer until surgery -NPO past midnight -Echo pending today -Will be non weight bearing for likely 8 weeks and will need SNF placement postoperatively   Shona Needles, MD [facility] [phone] (phone)

## 2017-09-21 NOTE — Consult Note (Signed)
Orthopaedic Trauma Service (OTS) Consult   Patient ID: [name] MRN: [id number] DOB/AGE: [birth year] 72 y.o.  Reason for Consult: Left tibial plateau fracture Referring Physician: Dr. Dorie Rank, MD [facility] Emergency Room  HPI: [name] is an 72 y.o. female who is being seen in consultation at the request of Dr. Tomi Bamberger for evaluation of left tibial plateau fracture. Patient has history of COPD, lung cancer, chronic kidney disease that fell while walking yesterday evening. She had immediate pain and inability to bear weight. She was carried to the car and taken to the ER where x-rays showed a left tibial plateau fracture. CT scan was obtained. Dr. Erlinda Hong who was on call asked that our service take over her care due to the complexity of the fracture and need for an orthopaedic traumatologist. Currently the patient is complaining of knee and back pain. She has not gotten out of bed. Knee immobilizer was placed earlier this AM. Denies pain to other leg or bilateral upper extremities.  Past Medical History:  Diagnosis Date  . Arthritis    back   . Cavitating mass in right upper lung lobe   . CKD (chronic kidney disease) stage 3, GFR 30-59 ml/min (HCC) 03/12/2015  . Diverticulosis   . Dizziness    occasionally   . Family history of kidney infection    pt history-saw Dr.Wrenn  . GERD (gastroesophageal reflux disease)    takes Nexium daily  . History of colon polyps   . Hyperlipidemia    takes Zocor daily  . Hypertension    takes Amlodipine daily and Chlorthalidone as well  . Lung cancer, Right upper lobe   . Lung nodules    right upper lobe  . Nocturia   . Pleural effusion on right 04/05/2017    Past Surgical History:  Procedure Laterality Date  . ABDOMINAL HYSTERECTOMY     at age 68;partial   . CARDIOVASCULAR STRESS TEST  06/29/2009   EF 79%, NO ISCHEMIA  . COLONOSCOPY    . IR THORACENTESIS ASP PLEURAL SPACE W/IMG GUIDE  04/25/2017  . TRACHEOSTOMY     at age 60 or 6 d/t polyps on  vocal cord  . VIDEO ASSISTED THORACOSCOPY (VATS)/WEDGE RESECTION Right 07/08/2013   Procedure: VIDEO ASSISTED THORACOSCOPY (VATS)/WEDGE RESECTION;  Surgeon: [name], MD;  Location: [place];  Service: Thoracic;  Laterality: Right;  Marland Kitchen VIDEO BRONCHOSCOPY N/A 07/08/2013   Procedure: VIDEO BRONCHOSCOPY;  Surgeon: [name], MD;  Location: [facility] OR;  Service: Thoracic;  Laterality: N/A;    Family History  Problem Relation Age of Onset  . Heart attack Father   . Cancer Brother        stomach    Social History:  reports that she quit smoking about 39 years ago. Her smoking use included cigarettes. she has never used smokeless tobacco. She reports that she drinks alcohol. She reports that she does not use drugs.  Allergies:  Allergies  Allergen Reactions  . Thiazide-Type Diuretics     Hyponatremia     Medications:  No current facility-administered medications on file prior to encounter.    Current Outpatient Medications on File Prior to Encounter  Medication Sig Dispense Refill  . esomeprazole (NEXIUM) 40 MG capsule Take 40 mg by mouth daily before breakfast.    . furosemide (LASIX) 20 MG tablet Take 1 tablet (20 mg total) by mouth daily. (Patient taking differently: Take 20 mg by mouth 3 (three) times a week. ) 15 tablet 0  .  KLOR-CON 10 10 MEQ tablet Take 10 mEq by mouth 3 (three) times a week.   4  . LORazepam (ATIVAN) 1 MG tablet Take 1 mg by mouth daily.    Marland Kitchen losartan (COZAAR) 25 MG tablet Take 2 tablets (50 mg total) by mouth daily. 60 tablet 0  . metoprolol succinate (TOPROL-XL) 50 MG 24 hr tablet Take 50 mg by mouth daily. Take with or immediately following a meal.    . Multiple Vitamin (MULTIVITAMIN WITH MINERALS) TABS tablet Take 1 tablet by mouth daily.    Marland Kitchen rOPINIRole (REQUIP) 0.25 MG tablet Take 0.5 mg by mouth at bedtime.     . simvastatin (ZOCOR) 20 MG tablet Take 20 mg by mouth every evening.      ROS: Constitutional: No fever or chills Vision: No changes in  vision ENT: No difficulty swallowing CV: No chest pain Pulm: No SOB or wheezing GI: No nausea or vomiting GU: No urgency or inability to hold urine Skin: No poor wound healing Neurologic: No numbness or tingling Psychiatric: No depression or anxiety Heme: No bruising Allergic: No reaction to medications or food   Exam: Blood pressure (!) 158/88, pulse (!) 59, temperature 98.4 F (36.9 C), temperature source Oral, resp. rate 10, SpO2 95 %. General:No acute distress Orientation:Awake alert and oriented x3 Mood and Affect: Cooperative and pleasant Gait: Unable to be assessed due to fracture Coordination and balance: Within normal limits  Left lower extremity: Knee immobilizer in place. Removed for exam. Unable to bend knee due to pain. Valgus alignment. +effusion. Skin wrinkles over anterolateral knee/proximal tibia. Compartments are soft and compressible. No pain at ankle with full painless ROM of ankle. Neuro intact to all nerve distributions. 2+DP pulse. No lymphadenopathy and normal reflexes.  Bilateral upper extremities and right lower extremity: Skin without lesions. No tenderness to palpation. Full painless ROM, full strength in each muscle groups without evidence of instability.   Medical Decision Making: Imaging: X-rays reviewed along with CT scan. Show tibial plateau fracture with severe depression of lateral joint. Nondisplaced fracture along medial plateau. Avulsion fracture off of medial femoral condyle consistent with MCL avulsion injury.  Labs:  CBC    Component Value Date/Time   WBC 10.4 09/21/2017 0337   RBC 3.58 (L) 09/21/2017 0337   HGB 11.8 (L) 09/21/2017 0337   HGB 13.3 07/06/2017 0832   HCT 34.2 (L) 09/21/2017 0337   HCT 38.8 07/06/2017 0832   PLT 209 09/21/2017 0337   PLT 227 07/06/2017 0832   MCV 95.5 09/21/2017 0337   MCV 93.3 07/06/2017 0832   MCH 33.0 09/21/2017 0337   MCHC 34.5 09/21/2017 0337   RDW 12.8 09/21/2017 0337   RDW 13.6 07/06/2017  0832   LYMPHSABS 1.2 09/21/2017 0337   LYMPHSABS 2.3 07/06/2017 0832   MONOABS 0.5 09/21/2017 0337   MONOABS 0.7 07/06/2017 0832   EOSABS 0.0 09/21/2017 0337   EOSABS 0.4 07/06/2017 0832   BASOSABS 0.0 09/21/2017 0337   BASOSABS 0.0 07/06/2017 0832    Medical history and chart was reviewed  Assessment/Plan: 72 year old female with history of COPD and lung cancer with syncope and left tibial plateau fracture  Discussed with patient that the fracture requires operative treatment. Feel that ORIF would be appropriate and plan to perform surgery tomorrow morning. Will need further workup per hospitalist service include Echo today. Discussed risks and benefits of surgical intervention. Risks discussed included bleeding requiring blood transfusion, bleeding causing a hematoma, infection, malunion, nonunion, damage to surrounding nerves  and blood vessels, pain, hardware prominence or irritation, hardware failure, stiffness, post-traumatic arthritis, DVT/PE, compartment syndrome, and even death. Patient agrees to proceed with surgery.  Recommendations: -NWB LLE, maintain knee immobilizer until surgery -NPO past midnight -Echo pending today -Will be non weight bearing for likely 8 weeks and will need SNF placement postoperatively   Shona Needles, MD [facility] [phone] (phone)

## 2017-09-21 NOTE — ED Notes (Signed)
Pt c/o being anxious, "my stomach is just jumping-- I am feeling like my skin is just crawling" Pt admits to drinking 4-5 beers a day-- will call for orders.

## 2017-09-21 NOTE — Progress Notes (Signed)
[facility] PROGRESS NOTE  [name] GMW:[id number] DOB: 08/11/1945 DOA: 09/20/2017   PCP: [name], MD     Assessment/Plan: Active Problems:   Hyperlipidemia   Lung cancer, Right upper lobe   Syncope   Hypokalemia   Hyponatremia   Tibial plateau fracture   Postural dizziness with presyncope    72 y.o. female with medical history significant of LUNG cancer sp resection sp chemotherapy complicated by right pleural effusion, COPD, history of syncopal spells, presents with a fall. Patient found to have a sodium of 128. X-ray showed commuted proximal tibial fracture. Orthopedics consulted     assessment and plan  . Syncope - in the setting of severe pain, likley vaso vagal, not a true syncope given no LOC but rather presyncopal episode. Also concern about arrhythmia due to electrolyte abnormalities and alcohol dependence.  Given risk factors  repeated echo which appears stable   . Hyperlipidemia - stable continue home medicaitons  . Tibial plateau fracture -  Appreciate orthopedics consult,  -  ORIF would be appropriate and plan to perform surgery tomorrow morning Keep nothing by mouth post midnight. Patient not on anticoagulation or antiplatelet agents  At home If echo normal , don't see any obvious C/I for surgery ECG - nsr  no known history of coronary artery disease, COPD  Liver failure    . Mild CKD, stage 2  with stable Cr,1.25>1.0   . Hypokalemia - - will replace and repeat in AM,  check magnesium level   . Hyponatremia - long standing but worse from baseline - likely secondary to dehydration, will give IVF,  Monitor Na levels to avoid over aggressive correction. Check TSH.     IVF     . Lung cancer, Right upper lobe - chronic stable     DVT prophylaxsis  lovenox today only  Code Status:  full    Family Communication: Discussed in detail with the patient, all imaging results, lab results explained to the patient   Disposition  Plan:  3-4 days       Consultants:  ortho  Procedures:  None   Antibiotics: Anti-infectives (From admission, onward)   Start     Dose/Rate Route Frequency Ordered Stop   09/21/17 1115  ceFAZolin (ANCEF) IVPB 2g/100 mL premix  Status:  Discontinued     2 g 200 mL/hr over 30 Minutes Intravenous On call to O.R. 09/21/17 1100 09/21/17 1101         HPI/Subjective: Per RN agitated , but calm during my visit , husband at bedside   Objective: Vitals:   09/21/17 1151 09/21/17 1206 09/21/17 1218 09/21/17 1300  BP: (!) 184/78  (!) 134/111   Pulse: 84 71 75 69  Resp:  11  11  Temp:      TempSrc:      SpO2:  97%  96%    Intake/Output Summary (Last 24 hours) at 09/21/2017 1411 Last data filed at 09/21/2017 0826 Gross per 24 hour  Intake 250 ml  Output -  Net 250 ml    Exam:  Examination:  General exam: Appears calm and comfortable  Respiratory system: Clear to auscultation. Respiratory effort normal. Cardiovascular system: S1 & S2 heard, RRR. No JVD, murmurs, rubs, gallops or clicks. No pedal edema. Gastrointestinal system: Abdomen is nondistended, soft and nontender. No organomegaly or masses felt. Normal bowel sounds heard. Central nervous system: Alert and oriented. No focal neurological deficits. Extremities: Symmetric 5 x 5 power. Skin:  No rashes, lesions or ulcers Psychiatry: Judgement and insight appear normal. Mood & affect appropriate.     Data Reviewed: I have personally reviewed following labs and imaging studies  Micro Results No results found for this or any previous visit (from the past 240 hour(s)).  Radiology Reports Dg Chest 1 View  Result Date: 09/20/2017 CLINICAL DATA:  Slipped and fell on water at restaurant. EXAM: CHEST 1 VIEW COMPARISON:  Chest radiograph June 01, 2017 FINDINGS: Cardiac silhouette is mildly enlarged and unchanged. Mildly calcified aortic knob. Chronic RIGHT lung base pleural thickening and scarring. No focal  consolidation. No pneumothorax. Included osseous structures are non suspicious. IMPRESSION: Stable cardiomegaly and RIGHT lung base pleural thickening/scarring. Electronically Signed   By: Elon Alas M.D.   On: 09/20/2017 22:19   Dg Thoracic Spine 2 View  Result Date: 09/20/2017 CLINICAL DATA:  72 year old female status post slip and fall at rest trauma. Pain. Personal history of lung cancer status post right upper lobectomy. EXAM: THORACIC SPINE 2 VIEWS COMPARISON:  Chest CT 07/06/2017 FINDINGS: Thoracic segmentation appears normal. There is levoconvex scoliosis of the lower thoracic spine in conjunction with lumbar scoliosis, resulting in some rotation of the lower thoracic vertebrae. Superimposed flowing lower thoracic endplate osteophytes. T9 butterfly vertebra, more apparent on the comparison CT. Cervicothoracic junction alignment is within normal limits. Stable thoracic vertebral height and alignment. Posterior ribs appear intact. Visualized thoracic visceral contours are stable. IMPRESSION: No acute osseous abnormality identified in the thoracic spine. Electronically Signed   By: Genevie Ann M.D.   On: 09/20/2017 22:20   Dg Lumbar Spine Complete  Result Date: 09/20/2017 CLINICAL DATA:  72 year old female status post slip and fall at restaurant. Pain. EXAM: LUMBAR SPINE - COMPLETE 4+ VIEW COMPARISON:  Lumbar radiographs 09/20/2015. FINDINGS: Chronic moderate to severe lumbar scoliosis with a rotatory component. Stable lumbar vertebral height and alignment since 2017. Associated chronic severe lumbar disc space loss and bulky endplate osteophytosis. Stable visible sacrum and SI joints. Visible lower thoracic levels appear stable. No acute osseous abnormality identified. Negative visible bowel gas pattern. Calcified aortic atherosclerosis. IMPRESSION: 1.  No acute osseous abnormality identified in the lumbar spine. 2. Chronic severe lumbar scoliosis, disc, and endplate degeneration. Electronically  Signed   By: Genevie Ann M.D.   On: 09/20/2017 22:14   Ct Knee Left Wo Contrast  Result Date: 09/21/2017 CLINICAL DATA:  Fracture of the knee after fall. EXAM: CT OF THE  KNEE WITHOUT CONTRAST TECHNIQUE: Multidetector CT imaging of the knee was performed according to the standard protocol. Multiplanar CT image reconstructions were also generated. COMPARISON:  None. FINDINGS: Bones/Joint/Cartilage There is an acute, closed, bicondylar fracture of the tibial plateau with up to 19 mm of central depression involving the lateral tibial plateau and with comminution. A sagittal oblique fracture without depression involving the medial tibial plateau is also noted exiting through the medial tibial metadiaphysis, series 6, image 53. Fracture also appears to undermining the tibial spine. There is an associated moderate suprapatellar lipohemarthrosis. A comminuted fibular head and neck fracture is noted without displacement. A fracture along the lateral cortex of the medial femoral condyle is also present. Ligaments Suboptimally assessed by CT. Given fracture of the medial femoral condyle along the expected course of the MCL, injury to the MCL is not excluded. Muscles and Tendons No intramuscular hemorrhage or atrophy. Soft tissues Posttraumatic subcutaneous soft tissue induration about the knee. IMPRESSION: 1. Acute bicondylar fracture of the tibial plateau with central depression of the lateral tibial  plateau to 19 mm with comminution. Sagittal oblique fracture through the medial tibial plateau is noted. Fracture appears to also undermines the tibial spine. Findings would be in keeping with a Schatzker type IV fracture. 2. Nondisplaced fracture of the medial femoral condyle along its outer cortex. Suspect medial collateral ligament injury given location of this fracture. 3. Comminuted fibular head and neck fracture without displacement. 4. Lipohemarthrosis. Electronically Signed   By: Ashley Royalty M.D.   On: 09/21/2017 01:07    Portable Chest 1 View  Result Date: 09/21/2017 CLINICAL DATA:  Preoperative evaluation.  History of lung cancer. EXAM: PORTABLE CHEST 1 VIEW COMPARISON:  Chest radiograph September 20, 2017 FINDINGS: Cardiac silhouette is mildly enlarged. Mildly calcified aortic arch. Similar hyperinflation with RIGHT lung base pleural thickening and scarring. RIGHT apical opacification and volume loss. Trachea projects midline and there is no pneumothorax. Soft tissue planes and included osseous structures are non-suspicious. IMPRESSION: Stable cardiomegaly and RIGHT lung base pleural thickening/scarring. RIGHT apical scarring, less likely consolidation. Aortic Atherosclerosis (ICD10-I70.0). Electronically Signed   By: Elon Alas M.D.   On: 09/21/2017 02:59   Dg Knee Complete 4 Views Left  Result Date: 09/20/2017 CLINICAL DATA:  72 year old female slipped and fell at restaurant with severe left knee pain. EXAM: LEFT KNEE - COMPLETE 4+ VIEW COMPARISON:  None. FINDINGS: No straight AP view of the left knee is provided. Comminuted nondisplaced fracture of the left fibula head is suspected. Comminuted fracture of the proximal left tibia is identified and is associated with depression of the left lateral tibial plateau. The medial plateau appears to remain intact. The patella appears intact. The distal left femur appears intact. Only a small joint effusion is evident. IMPRESSION: 1. Comminuted fracture of the proximal left tibia with depressed lateral tibial plateau. 2. Nondisplaced comminuted fracture of the left fibula head. Electronically Signed   By: Genevie Ann M.D.   On: 09/20/2017 22:17     CBC Recent Labs  Lab 09/20/17 2119 09/21/17 0337  WBC 12.5* 10.4  HGB 13.7 11.8*  HCT 38.1 34.2*  PLT 234 209  MCV 94.3 95.5  MCH 33.9 33.0  MCHC 36.0 34.5  RDW 12.6 12.8  LYMPHSABS  --  1.2  MONOABS  --  0.5  EOSABS  --  0.0  BASOSABS  --  0.0    Chemistries  Recent Labs  Lab 09/20/17 2119 09/21/17 0337   NA 128* 129*  K 3.4* 3.8  CL 93* 94*  CO2 19* 23  GLUCOSE 130* 148*  BUN 15 12  CREATININE 1.25* 1.00  CALCIUM 9.5 8.7*  AST  --  28  ALT  --  21  ALKPHOS  --  97  BILITOT  --  0.7   ------------------------------------------------------------------------------------------------------------------ CrCl cannot be calculated (Unknown ideal weight.). ------------------------------------------------------------------------------------------------------------------ No results for input(s): HGBA1C in the last 72 hours. ------------------------------------------------------------------------------------------------------------------ No results for input(s): CHOL, HDL, LDLCALC, TRIG, CHOLHDL, LDLDIRECT in the last 72 hours. ------------------------------------------------------------------------------------------------------------------ No results for input(s): TSH, T4TOTAL, T3FREE, THYROIDAB in the last 72 hours.  Invalid input(s): FREET3 ------------------------------------------------------------------------------------------------------------------ No results for input(s): VITAMINB12, FOLATE, FERRITIN, TIBC, IRON, RETICCTPCT in the last 72 hours.  Coagulation profile No results for input(s): INR, PROTIME in the last 168 hours.  No results for input(s): DDIMER in the last 72 hours.  Cardiac Enzymes Recent Labs  Lab 09/21/17 0210 09/21/17 0337 09/21/17 1123  TROPONINI <0.03 <0.03 <0.03   ------------------------------------------------------------------------------------------------------------------ Invalid input(s): POCBNP   CBG: Recent Labs  Lab 09/21/17 0533  GLUCAP 163*  Studies: Dg Chest 1 View  Result Date: 09/20/2017 CLINICAL DATA:  Slipped and fell on water at restaurant. EXAM: CHEST 1 VIEW COMPARISON:  Chest radiograph June 01, 2017 FINDINGS: Cardiac silhouette is mildly enlarged and unchanged. Mildly calcified aortic knob. Chronic RIGHT lung base  pleural thickening and scarring. No focal consolidation. No pneumothorax. Included osseous structures are non suspicious. IMPRESSION: Stable cardiomegaly and RIGHT lung base pleural thickening/scarring. Electronically Signed   By: Elon Alas M.D.   On: 09/20/2017 22:19   Dg Thoracic Spine 2 View  Result Date: 09/20/2017 CLINICAL DATA:  72 year old female status post slip and fall at rest trauma. Pain. Personal history of lung cancer status post right upper lobectomy. EXAM: THORACIC SPINE 2 VIEWS COMPARISON:  Chest CT 07/06/2017 FINDINGS: Thoracic segmentation appears normal. There is levoconvex scoliosis of the lower thoracic spine in conjunction with lumbar scoliosis, resulting in some rotation of the lower thoracic vertebrae. Superimposed flowing lower thoracic endplate osteophytes. T9 butterfly vertebra, more apparent on the comparison CT. Cervicothoracic junction alignment is within normal limits. Stable thoracic vertebral height and alignment. Posterior ribs appear intact. Visualized thoracic visceral contours are stable. IMPRESSION: No acute osseous abnormality identified in the thoracic spine. Electronically Signed   By: Genevie Ann M.D.   On: 09/20/2017 22:20   Dg Lumbar Spine Complete  Result Date: 09/20/2017 CLINICAL DATA:  72 year old female status post slip and fall at restaurant. Pain. EXAM: LUMBAR SPINE - COMPLETE 4+ VIEW COMPARISON:  Lumbar radiographs 09/20/2015. FINDINGS: Chronic moderate to severe lumbar scoliosis with a rotatory component. Stable lumbar vertebral height and alignment since 2017. Associated chronic severe lumbar disc space loss and bulky endplate osteophytosis. Stable visible sacrum and SI joints. Visible lower thoracic levels appear stable. No acute osseous abnormality identified. Negative visible bowel gas pattern. Calcified aortic atherosclerosis. IMPRESSION: 1.  No acute osseous abnormality identified in the lumbar spine. 2. Chronic severe lumbar scoliosis, disc, and  endplate degeneration. Electronically Signed   By: Genevie Ann M.D.   On: 09/20/2017 22:14   Ct Knee Left Wo Contrast  Result Date: 09/21/2017 CLINICAL DATA:  Fracture of the knee after fall. EXAM: CT OF THE  KNEE WITHOUT CONTRAST TECHNIQUE: Multidetector CT imaging of the knee was performed according to the standard protocol. Multiplanar CT image reconstructions were also generated. COMPARISON:  None. FINDINGS: Bones/Joint/Cartilage There is an acute, closed, bicondylar fracture of the tibial plateau with up to 19 mm of central depression involving the lateral tibial plateau and with comminution. A sagittal oblique fracture without depression involving the medial tibial plateau is also noted exiting through the medial tibial metadiaphysis, series 6, image 53. Fracture also appears to undermining the tibial spine. There is an associated moderate suprapatellar lipohemarthrosis. A comminuted fibular head and neck fracture is noted without displacement. A fracture along the lateral cortex of the medial femoral condyle is also present. Ligaments Suboptimally assessed by CT. Given fracture of the medial femoral condyle along the expected course of the MCL, injury to the MCL is not excluded. Muscles and Tendons No intramuscular hemorrhage or atrophy. Soft tissues Posttraumatic subcutaneous soft tissue induration about the knee. IMPRESSION: 1. Acute bicondylar fracture of the tibial plateau with central depression of the lateral tibial plateau to 19 mm with comminution. Sagittal oblique fracture through the medial tibial plateau is noted. Fracture appears to also undermines the tibial spine. Findings would be in keeping with a Schatzker type IV fracture. 2. Nondisplaced fracture of the medial femoral condyle along its outer cortex. Suspect medial  collateral ligament injury given location of this fracture. 3. Comminuted fibular head and neck fracture without displacement. 4. Lipohemarthrosis. Electronically Signed   By:  Ashley Royalty M.D.   On: 09/21/2017 01:07   Portable Chest 1 View  Result Date: 09/21/2017 CLINICAL DATA:  Preoperative evaluation.  History of lung cancer. EXAM: PORTABLE CHEST 1 VIEW COMPARISON:  Chest radiograph September 20, 2017 FINDINGS: Cardiac silhouette is mildly enlarged. Mildly calcified aortic arch. Similar hyperinflation with RIGHT lung base pleural thickening and scarring. RIGHT apical opacification and volume loss. Trachea projects midline and there is no pneumothorax. Soft tissue planes and included osseous structures are non-suspicious. IMPRESSION: Stable cardiomegaly and RIGHT lung base pleural thickening/scarring. RIGHT apical scarring, less likely consolidation. Aortic Atherosclerosis (ICD10-I70.0). Electronically Signed   By: Elon Alas M.D.   On: 09/21/2017 02:59   Dg Knee Complete 4 Views Left  Result Date: 09/20/2017 CLINICAL DATA:  72 year old female slipped and fell at restaurant with severe left knee pain. EXAM: LEFT KNEE - COMPLETE 4+ VIEW COMPARISON:  None. FINDINGS: No straight AP view of the left knee is provided. Comminuted nondisplaced fracture of the left fibula head is suspected. Comminuted fracture of the proximal left tibia is identified and is associated with depression of the left lateral tibial plateau. The medial plateau appears to remain intact. The patella appears intact. The distal left femur appears intact. Only a small joint effusion is evident. IMPRESSION: 1. Comminuted fracture of the proximal left tibia with depressed lateral tibial plateau. 2. Nondisplaced comminuted fracture of the left fibula head. Electronically Signed   By: Genevie Ann M.D.   On: 09/20/2017 22:17      No results found for: HGBA1C Lab Results  Component Value Date   CREATININE 1.00 09/21/2017       Scheduled Meds: . folic acid  1 mg Oral Daily  . losartan  50 mg Oral Daily  . metoprolol succinate  50 mg Oral Daily  . multivitamin with minerals  1 tablet Oral Daily  .  pantoprazole  40 mg Oral Daily  . [START ON 09/22/2017] rOPINIRole  0.5 mg Oral QHS  . [START ON 09/22/2017] simvastatin  20 mg Oral QPM  . sodium chloride flush  3 mL Intravenous Q12H  . thiamine  100 mg Oral Daily   Or  . thiamine  100 mg Intravenous Daily   Continuous Infusions: . sodium chloride 100 mL/hr at 09/21/17 0309  . methocarbamol (ROBAXIN)  IV       LOS: 0 days    Time spent: >30 MINS    Reyne Dumas  [facility] Pager [phone]. If 7PM-7AM, please contact night-coverage at [email or web address], password [facility] 09/21/2017, 2:11 PM  LOS: 0 days

## 2017-09-21 NOTE — ED Notes (Signed)
Report given to [facility] RN

## 2017-09-21 NOTE — Progress Notes (Signed)
  Echocardiogram 2D Echocardiogram has been performed.  [name] 09/21/2017, 4:34 PM

## 2017-09-21 NOTE — ED Notes (Signed)
Pt's husband Ron would like to be notified with a bed assignment. [phone]

## 2017-09-21 NOTE — ED Notes (Signed)
X-ray at bedside

## 2017-09-21 NOTE — Clinical Social Work Note (Signed)
Clinical Social Work Assessment  Patient Details  Name: [name] MRN: [id number] Date of Birth: 10/02/1945  Date of referral:  09/21/17               Reason for consult:  Facility Placement                Permission sought to share information with:  Family Supports Permission granted to share information::  Yes, Verbal Permission Granted  Name::     [place] Plan   Agency::  family  Relationship::  Spouse   Contact Information:  Rayden Scheper [phone]  Housing/Transportation Living arrangements for the past 2 months:  Single Family Home(with husband) Source of Information:  Patient Patient Interpreter Needed:  None Criminal Activity/Legal Involvement Pertinent to Current Situation/Hospitalization:  No - Comment as needed Significant Relationships:  Adult Children, Spouse Lives with:  Spouse Do you feel safe going back to the place where you live?  Yes Need for family participation in patient care:  Yes (Comment)  Care giving concerns:  CSW spoke with pt at bedside. At this time pt expressed no further concerns to CSW.    Social Worker assessment / plan:  CSW spoke with pt at bedside. During this time CSW was informed that pt is from home with husband. Pt reports that husband is pt's primary support at this time. Pt does express having two children, however they do not live around the [place] area. CSW was informed that pt is agreeable to SNF placement at the time of discharge if recommended. Pt expressed interest in [facility] as well as other facilities at this time. Pt would like to speak with husband before making final decision about placement.   Employment status:  Retired Forensic scientist:  Medicare PT Recommendations:  Not assessed at this time [place] / Referral to community resources:  [place]  Patient/Family's Response to care:  Pt appeared to be understanding and agreeable to plan of care at this time.   Patient/Family's  Understanding of and Emotional Response to Diagnosis, Current Treatment, and Prognosis:  No further question or concerns have been presented to CSW at this time.  Emotional Assessment Appearance:  Appears stated age Attitude/Demeanor/Rapport:  Engaged, Gracious Affect (typically observed):  Accepting, Appropriate, Pleasant Orientation:  Oriented to Self, Oriented to Place, Oriented to  Time, Oriented to Situation Alcohol / Substance use:  Not Applicable Psych involvement (Current and /or in the community):  No (Comment)  Discharge Needs  Concerns to be addressed:  No discharge needs identified Readmission within the last 30 days:  No Current discharge risk:  Dependent with Mobility Barriers to Discharge:  Continued Medical Work up   [employer], [place] 09/21/2017, 7:34 AM

## 2017-09-22 ENCOUNTER — Encounter (HOSPITAL_COMMUNITY)
Admission: EM | Disposition: A | Discharge status: Skilled Nursing Facility | Payer: Self-pay | Source: Home / Self Care | Attending: Internal Medicine

## 2017-09-22 ENCOUNTER — Inpatient Hospital Stay (HOSPITAL_COMMUNITY): Payer: Medicare Other | Admitting: Certified Registered Nurse Anesthetist

## 2017-09-22 ENCOUNTER — Inpatient Hospital Stay (HOSPITAL_COMMUNITY): Payer: Medicare Other

## 2017-09-22 HISTORY — PX: ORIF TIBIA PLATEAU: SHX2132

## 2017-09-22 LAB — COMPREHENSIVE METABOLIC PANEL
ALBUMIN: 3.6 g/dL (ref 3.5–5.0)
ALT: 15 U/L (ref 14–54)
AST: 22 U/L (ref 15–41)
Alkaline Phosphatase: 87 U/L (ref 38–126)
Anion gap: 10 (ref 5–15)
BUN: 7 mg/dL (ref 6–20)
CHLORIDE: 98 mmol/L — AB (ref 101–111)
CO2: 22 mmol/L (ref 22–32)
Calcium: 8.7 mg/dL — ABNORMAL LOW (ref 8.9–10.3)
Creatinine, Ser: 0.97 mg/dL (ref 0.44–1.00)
GFR calc Af Amer: >60 mL/min (ref >60)
GFR calc non Af Amer: 57 mL/min — ABNORMAL LOW (ref >60)
GLUCOSE: 122 mg/dL — AB (ref 65–99)
POTASSIUM: 3.8 mmol/L (ref 3.5–5.1)
Sodium: 130 mmol/L — ABNORMAL LOW (ref 135–145)
Total Bilirubin: 0.6 mg/dL (ref 0.3–1.2)
Total Protein: 5.9 g/dL — ABNORMAL LOW (ref 6.5–8.1)

## 2017-09-22 LAB — GLUCOSE, CAPILLARY: Glucose-Capillary: 98 mg/dL (ref 65–99)

## 2017-09-22 LAB — CBC
HEMATOCRIT: 33.6 % — AB (ref 36.0–46.0)
Hemoglobin: 11.2 g/dL — ABNORMAL LOW (ref 12.0–15.0)
MCH: 32.1 pg (ref 26.0–34.0)
MCHC: 33.3 g/dL (ref 30.0–36.0)
MCV: 96.3 fL (ref 78.0–100.0)
Platelets: 170 10*3/uL (ref 150–400)
RBC: 3.49 MIL/uL — ABNORMAL LOW (ref 3.87–5.11)
RDW: 12.9 % (ref 11.5–15.5)
WBC: 7 10*3/uL (ref 4.0–10.5)

## 2017-09-22 LAB — MAGNESIUM: Magnesium: 2 mg/dL (ref 1.7–2.4)

## 2017-09-22 SURGERY — OPEN REDUCTION INTERNAL FIXATION (ORIF) TIBIAL PLATEAU
Anesthesia: General | Laterality: Left

## 2017-09-22 MED ORDER — LABETALOL HCL 5 MG/ML IV SOLN
10.0000 mg | INTRAVENOUS | Status: DC | PRN
  Administered 2017-09-22: 10 mg via INTRAVENOUS

## 2017-09-22 MED ORDER — CEFAZOLIN SODIUM-DEXTROSE 2-4 GM/100ML-% IV SOLN
2.0000 g | Freq: Three times a day (TID) | INTRAVENOUS | Status: AC
  Administered 2017-09-22 – 2017-09-23 (×3): 2 g via INTRAVENOUS
  Filled 2017-09-22 (×3): qty 100

## 2017-09-22 MED ORDER — PROPOFOL 10 MG/ML IV BOLUS
INTRAVENOUS | Status: DC | PRN
  Administered 2017-09-22: 150 mg via INTRAVENOUS

## 2017-09-22 MED ORDER — ONDANSETRON HCL 4 MG/2ML IJ SOLN
INTRAMUSCULAR | Status: DC | PRN
  Administered 2017-09-22: 4 mg via INTRAVENOUS

## 2017-09-22 MED ORDER — FENTANYL CITRATE (PF) 100 MCG/2ML IJ SOLN
INTRAMUSCULAR | Status: AC
  Filled 2017-09-22: qty 2

## 2017-09-22 MED ORDER — ROCURONIUM BROMIDE 10 MG/ML (PF) SYRINGE
PREFILLED_SYRINGE | INTRAVENOUS | Status: DC | PRN
  Administered 2017-09-22: 40 mg via INTRAVENOUS

## 2017-09-22 MED ORDER — EPHEDRINE SULFATE 50 MG/ML IJ SOLN
INTRAMUSCULAR | Status: DC | PRN
  Administered 2017-09-22: 10 mg via INTRAVENOUS

## 2017-09-22 MED ORDER — ONDANSETRON HCL 4 MG/2ML IJ SOLN: 4.0000 mg | Freq: Once | INTRAMUSCULAR | Status: DC | PRN

## 2017-09-22 MED ORDER — ACETAMINOPHEN 10 MG/ML IV SOLN
INTRAVENOUS | Status: AC
  Filled 2017-09-22: qty 100

## 2017-09-22 MED ORDER — SUGAMMADEX SODIUM 200 MG/2ML IV SOLN
INTRAVENOUS | Status: DC | PRN
  Administered 2017-09-22: 152.4 mg via INTRAVENOUS

## 2017-09-22 MED ORDER — PROPOFOL 10 MG/ML IV BOLUS
INTRAVENOUS | Status: AC
  Filled 2017-09-22: qty 20

## 2017-09-22 MED ORDER — FENTANYL CITRATE (PF) 100 MCG/2ML IJ SOLN
25.0000 ug | INTRAMUSCULAR | Status: DC | PRN
  Administered 2017-09-22 (×3): 50 ug via INTRAVENOUS

## 2017-09-22 MED ORDER — LACTATED RINGERS IV SOLN
INTRAVENOUS | Status: DC
  Administered 2017-09-22 (×2): via INTRAVENOUS

## 2017-09-22 MED ORDER — CEFAZOLIN SODIUM-DEXTROSE 2-4 GM/100ML-% IV SOLN
2.0000 g | INTRAVENOUS | Status: AC
  Administered 2017-09-22: 2 g via INTRAVENOUS
  Filled 2017-09-22 (×2): qty 100

## 2017-09-22 MED ORDER — BACITRACIN ZINC 500 UNIT/GM EX OINT
TOPICAL_OINTMENT | CUTANEOUS | Status: DC | PRN
  Administered 2017-09-22: 1 via TOPICAL

## 2017-09-22 MED ORDER — FENTANYL CITRATE (PF) 250 MCG/5ML IJ SOLN
INTRAMUSCULAR | Status: AC
  Filled 2017-09-22: qty 5

## 2017-09-22 MED ORDER — HYDROMORPHONE HCL 1 MG/ML IJ SOLN: 0.5000 mg | INTRAMUSCULAR | Status: DC | PRN

## 2017-09-22 MED ORDER — 0.9 % SODIUM CHLORIDE (POUR BTL) OPTIME
TOPICAL | Status: DC | PRN
  Administered 2017-09-22: 1000 mL

## 2017-09-22 MED ORDER — BACITRACIN ZINC 500 UNIT/GM EX OINT
TOPICAL_OINTMENT | CUTANEOUS | Status: AC
  Filled 2017-09-22: qty 28.35

## 2017-09-22 MED ORDER — HYDRALAZINE HCL 20 MG/ML IJ SOLN
10.0000 mg | Freq: Four times a day (QID) | INTRAMUSCULAR | Status: DC | PRN
  Administered 2017-09-22 – 2017-09-24 (×4): 10 mg via INTRAVENOUS
  Filled 2017-09-22 (×4): qty 1

## 2017-09-22 MED ORDER — VANCOMYCIN HCL 1000 MG IV SOLR
INTRAVENOUS | Status: AC
  Filled 2017-09-22: qty 1000

## 2017-09-22 MED ORDER — LABETALOL HCL 5 MG/ML IV SOLN
INTRAVENOUS | Status: AC
  Filled 2017-09-22: qty 4

## 2017-09-22 MED ORDER — FENTANYL CITRATE (PF) 250 MCG/5ML IJ SOLN
INTRAMUSCULAR | Status: DC | PRN
  Administered 2017-09-22 (×7): 50 ug via INTRAVENOUS

## 2017-09-22 MED ORDER — FENTANYL CITRATE (PF) 100 MCG/2ML IJ SOLN
INTRAMUSCULAR | Status: AC
  Administered 2017-09-22: 50 ug via INTRAVENOUS
  Filled 2017-09-22: qty 2

## 2017-09-22 MED ORDER — DEXAMETHASONE SODIUM PHOSPHATE 10 MG/ML IJ SOLN
INTRAMUSCULAR | Status: DC | PRN
  Administered 2017-09-22: 10 mg via INTRAVENOUS

## 2017-09-22 MED ORDER — VANCOMYCIN HCL 1000 MG IV SOLR
INTRAVENOUS | Status: DC | PRN
  Administered 2017-09-22: 1000 mg via TOPICAL

## 2017-09-22 MED ORDER — LIDOCAINE 2% (20 MG/ML) 5 ML SYRINGE
INTRAMUSCULAR | Status: DC | PRN
  Administered 2017-09-22: 80 mg via INTRAVENOUS

## 2017-09-22 MED ORDER — ACETAMINOPHEN 10 MG/ML IV SOLN
INTRAVENOUS | Status: DC | PRN
  Administered 2017-09-22: 1000 mg via INTRAVENOUS

## 2017-09-22 SURGICAL SUPPLY — 77 items
BANDAGE ACE 4X5 VEL STRL LF (GAUZE/BANDAGES/DRESSINGS) ×3 IMPLANT
BANDAGE ACE 6X5 VEL STRL LF (GAUZE/BANDAGES/DRESSINGS) ×3 IMPLANT
BANDAGE ESMARK 6X9 LF (GAUZE/BANDAGES/DRESSINGS) ×1 IMPLANT
BIT DRILL 2.5 X LONG (BIT) ×1
BIT DRILL PERC QC 2.8X200 100 (BIT) IMPLANT
BIT DRILL X LONG 2.5 (BIT) IMPLANT
BLADE CLIPPER SURG (BLADE) IMPLANT
BLADE SURG 15 STRL LF DISP TIS (BLADE) ×1 IMPLANT
BLADE SURG 15 STRL SS (BLADE) ×3
BNDG CMPR 9X6 STRL LF SNTH (GAUZE/BANDAGES/DRESSINGS) ×1
BNDG ESMARK 6X9 LF (GAUZE/BANDAGES/DRESSINGS) ×3
BNDG GAUZE ELAST 4 BULKY (GAUZE/BANDAGES/DRESSINGS) ×3 IMPLANT
BONE CANC CHIPS 40CC CAN1/2 (Bone Implant) ×3 IMPLANT
BRUSH SCRUB SURG 4.25 DISP (MISCELLANEOUS) ×6 IMPLANT
CANISTER SUCT 3000ML PPV (MISCELLANEOUS) ×3 IMPLANT
CHIPS CANC BONE 40CC CAN1/2 (Bone Implant) ×1 IMPLANT
CHLORAPREP W/TINT 26ML (MISCELLANEOUS) ×3 IMPLANT
COVER SURGICAL LIGHT HANDLE (MISCELLANEOUS) ×3 IMPLANT
CUFF TOURNIQUET SINGLE 34IN LL (TOURNIQUET CUFF) ×3 IMPLANT
DRAPE C-ARM 42X72 X-RAY (DRAPES) ×3 IMPLANT
DRAPE C-ARMOR (DRAPES) ×3 IMPLANT
DRAPE ORTHO SPLIT 77X108 STRL (DRAPES) ×6
DRAPE SURG ORHT 6 SPLT 77X108 (DRAPES) ×2 IMPLANT
DRAPE U-SHAPE 47X51 STRL (DRAPES) ×3 IMPLANT
DRILL BIT QUICK COUP 2.8MM 100 (BIT) ×2
DRILL BIT X LONG 2.5 (BIT) ×3
DRSG ADAPTIC 3X8 NADH LF (GAUZE/BANDAGES/DRESSINGS) ×2 IMPLANT
DRSG PAD ABDOMINAL 8X10 ST (GAUZE/BANDAGES/DRESSINGS) ×4 IMPLANT
ELECT REM PT RETURN 9FT ADLT (ELECTROSURGICAL) ×3
ELECTRODE REM PT RTRN 9FT ADLT (ELECTROSURGICAL) ×1 IMPLANT
GAUZE SPONGE 4X4 12PLY STRL (GAUZE/BANDAGES/DRESSINGS) ×3 IMPLANT
GLOVE BIO SURGEON STRL SZ7.5 (GLOVE) ×12 IMPLANT
GLOVE BIOGEL PI IND STRL 7.5 (GLOVE) ×1 IMPLANT
GLOVE BIOGEL PI INDICATOR 7.5 (GLOVE) ×2
GOWN STRL REUS W/ TWL LRG LVL3 (GOWN DISPOSABLE) ×2 IMPLANT
GOWN STRL REUS W/TWL LRG LVL3 (GOWN DISPOSABLE) ×6
GRAFT BNE CHIP CANC 1-8 40 (Bone Implant) IMPLANT
IMMOBILIZER KNEE 22 UNIV (SOFTGOODS) ×3 IMPLANT
K-WIRE 1.6X150 (WIRE) ×3
KIT BASIN OR (CUSTOM PROCEDURE TRAY) ×3 IMPLANT
KIT ROOM TURNOVER OR (KITS) ×3 IMPLANT
KWIRE 1.6X150 (WIRE) IMPLANT
NDL SUT 6 .5 CRC .975X.05 MAYO (NEEDLE) IMPLANT
NEEDLE MAYO TAPER (NEEDLE)
NS IRRIG 1000ML POUR BTL (IV SOLUTION) ×3 IMPLANT
PACK TOTAL JOINT (CUSTOM PROCEDURE TRAY) ×3 IMPLANT
PAD ARMBOARD 7.5X6 YLW CONV (MISCELLANEOUS) ×6 IMPLANT
PAD CAST 4YDX4 CTTN HI CHSV (CAST SUPPLIES) ×1 IMPLANT
PADDING CAST COTTON 4X4 STRL (CAST SUPPLIES) ×3
PADDING CAST COTTON 6X4 STRL (CAST SUPPLIES) ×3 IMPLANT
PLATE PROX TIBIA 6H LT 3.5 VA (Plate) ×2 IMPLANT
SCREW CORTEX 3.5 30MM (Screw) ×2 IMPLANT
SCREW CORTEX 3.5 36MM (Screw) ×4 IMPLANT
SCREW CORTEX 3.5 40MM (Screw) ×2 IMPLANT
SCREW CORTEX 3.5 70MM SELF TAP (Screw) ×2 IMPLANT
SCREW LOCK CORT ST 3.5X30 (Screw) IMPLANT
SCREW LOCK CORT ST 3.5X36 (Screw) IMPLANT
SCREW LOCK CORT ST 3.5X40 (Screw) IMPLANT
SCREW LOCKING 3.5X70MM VA (Screw) ×4 IMPLANT
SCREW LOCKING VA 3.5X75MM (Screw) ×2 IMPLANT
SCREW VA-LOCKING 65MM 3.5 (Screw) ×2 IMPLANT
STAPLER VISISTAT 35W (STAPLE) ×3 IMPLANT
SUCTION FRAZIER HANDLE 10FR (MISCELLANEOUS) ×2
SUCTION TUBE FRAZIER 10FR DISP (MISCELLANEOUS) ×1 IMPLANT
SUT ETHILON 3 0 PS 1 (SUTURE) ×8 IMPLANT
SUT MNCRL AB 3-0 PS2 18 (SUTURE) ×3 IMPLANT
SUT PROLENE 0 CT (SUTURE) IMPLANT
SUT PROLENE 0 CT 2 (SUTURE) ×6 IMPLANT
SUT VIC AB 0 CT1 27 (SUTURE) ×3
SUT VIC AB 0 CT1 27XBRD ANBCTR (SUTURE) ×1 IMPLANT
SUT VIC AB 1 CT1 27 (SUTURE) ×3
SUT VIC AB 1 CT1 27XBRD ANBCTR (SUTURE) ×1 IMPLANT
SUT VIC AB 2-0 CT1 27 (SUTURE) ×6
SUT VIC AB 2-0 CT1 TAPERPNT 27 (SUTURE) ×2 IMPLANT
TOWEL OR 17X26 10 PK STRL BLUE (TOWEL DISPOSABLE) ×6 IMPLANT
TRAY FOLEY W/METER SILVER 16FR (SET/KITS/TRAYS/PACK) IMPLANT
WATER STERILE IRR 1000ML POUR (IV SOLUTION) ×6 IMPLANT

## 2017-09-22 NOTE — Op Note (Signed)
OrthopaedicSurgeryOperativeNote [phone]) Date of Surgery: 09/22/2017  Admit Date: 09/20/2017   Diagnoses: Pre-Op Diagnoses: Left bicondylar tibial plateau fracture Left MCL femoral avulsion fracture  Post-Op Diagnosis: Left bicondylar tibial plateau fracture Left MCL femoral avulsion fracture Left peripheral meniscus tear  Procedures: 1. CPT 27536-ORIF of left bicondylar tibial plateau fracture 2. CPT 27403-Repair of left lateral meniscus tear 3. CPT 27508-Closed treatment of left medial femoral epicondyle avulsion fracture  Surgeons: Primary: Shona Needles, MD   Location:[facility] OR ROOM 03   AnesthesiaGeneral   Antibiotics:Ancef 2g preop   Tourniquettime:95 min at 325mmHg  [id number]:62 mL   Complications:None  Specimens:None  Implants: Implant Name Type Inv. Item Serial No. Manufacturer Lot No. LRB No. Used Action  BONE CANC CHIPS 40CC - [id number] Bone Implant BONE CANC CHIPS 40CC  [employer]  Left 1 Implanted  PLATE PROX TIBIA 6H LT 3.5 VA - SN/A Plate PLATE PROX TIBIA 6H LT 3.5 VA N/A [employer]ES [employer]UMA N/A Left 1 Implanted  SCREW CORTEX 3.5 70MM SELF TAP - SN/A Screw SCREW CORTEX 3.5 70MM SELF TAP N/A [employer] N/A Left 1 Implanted  SCREW LOCKING VA 3.5X75MM - SN/A Screw SCREW LOCKING VA 3.5X75MM N/A [employer] N/A Left 1 Implanted  SCREW LOCKING 3.5X70MM VA - SN/A Screw SCREW LOCKING 3.5X70MM VA N/A SYN[employer] N/A Left 2 Implanted  SCREW CORTEX 3.5 30MM - SN/A Screw SCREW CORTEX 3.5 30MM N/A SYN[employer]ES [employer]UMA N/A Left 1 Implanted  SCREW VA-LOCKING 65MM 3.5 - SN/A Screw SCREW VA-LOCKING 65MM 3.5 N/A SYNTHES TRAUMA N/A Left 1 Implanted  SCREW CORTEX 3.5 40MM - SN/A Screw SCREW CORTEX 3.5 40MM N/A SYN[employer]ES [employer]UMA N/A Left 1 Implanted  SCREW CORTEX 3.5 36MM - SN/A Screw SCREW CORTEX 3.5 36MM N/A [employer]UMA N/A Left 1 Implanted  SCREW CORTEX 3.5 36MM - SN/A Screw SCREW CORTEX 3.5 36MM N/A SYN[employer]ES [employer]UMA N/A Left 1  Implanted    IndicationsforSurgery: This is a 72 year old female who sustained a ground-level fall where she got her leg caught underneath her when she tripped and fell over a wet surface.  She sustained a bicondylar left tibial plateau fracture along with a avulsion off the medial femoral epicondyle consistent with an MCL avulsion.  She had severe displacement of her lateral articular joint.  She had a severe valgus instability due to the fracture.  I felt that nonoperative treatment was not an option.  I plan to proceed with open reduction internal fixation of her left tibial plateau fracture.  I would recommend fixation and repair of the joint with allograft along with lateral buttress plating with repair of likely meniscus tear. Risks discussed included bleeding requiring blood transfusion, bleeding causing a hematoma, infection, malunion, nonunion, damage to surrounding nerves and blood vessels, pain, hardware prominence or irritation, hardware failure, stiffness, post-traumatic arthritis, DVT/PE, compartment syndrome, and even death.  The patient agreed to proceed with the surgery and consent was obtained.  Operative Findings: 1.  Severe lateral joint depression with significant bone void of the lateral metaphysis, nondisplaced medial plateau fracture 2.  ORIF performed with a [employer]ynthes 4-hole VA lateral locking proximal tibial plate 3.  Peripheral bucket-handle meniscus tear of lateral meniscus treated with horizontal mattress sutures of #2 FiberWire 4. Nonoperative treatment of medial femoral epicondyle avulsion injury.  Procedure: The patient was identified in the preoperative holding area.  Consent was confirmed with the patient and the operative extremity was marked.  She was then brought back to the operating room by anesthesia colleagues.  She was carefully transferred over to a radiolucent flat top table.  She was placed under general anesthetic.  A bump was placed under operative hip.  A  nonsterile tourniquet was placed to the upper thigh. The operative extremity was then prepped and draped in usual sterile fashion. A preoperative timeout was performed to verify the patient, the procedure, and the extremity. Preoperative antibiotics were dosed.  Fluoroscopy was used evaluate the injury and were saved. An esmarch was used to exsanguinate the leg and it was inflated to 313mmHg. An anterolateral parapatellar incision was performed and carried through skin and subcutaneous tissue. The overlying fascia was incised in line with the skin incision just lateral to the patellar tendon. It was extended distally into the anterior compartment fascia. Bovie electrocautery was then used to elevated the IT band and musculature off of the anterolateral cortex of the tibia. The release was taken back until the fibular head was palpable. The plane between the IT band and the lateral capsule was developed and the anterior fat pad was resected to expose the capsule.  A submensical arthrotomy was then performed with a 15 blade. Tag sutures were used to retract the capsule and here I was able to visualize the impacted lateral joint and was able to see the meniscus. There was a peripheral tear of the lateral meniscus with full separation from the meniscocapsular junction. Number 2 Fiberwire was used to throw horizontal mattress sutures through the capsule and into the body of the meniscus. A total of 3 sutures were thrown and these were tagged with hemostats.  There was a split in the anterolateral cortex that was entered into with a Cobb elevator. The clot was debrided and here I was able to visualize the severe depression of a large portion of the lateral plateau.  I was able to elevate this back flush with the posterior lateral joint surface. This was done with assistance by fluoroscopy in both the lateral and AP. Once I had elevated the joint sufficiently both visually and fluoroscopically.  The joint reduction  was held provisionally with K wires into the medial plateau.  I used crushed cancellous allograft to back filled the void left. I packed the graft underneath the articular surface to provide some structural support.  At this point I reduce the lateral wall back to the articular surface and I used a large periarticular reduction clamp to reduce the lateral plateau split and restore the width of the tibial plateau. I provisionally held this in place with a number of K-wires. The periarticular reduction clamp was then removed.  A 3.70mm LCP proximal tibial locking plate was chosen and was aligned to the tibia in both the AP and lateral fluoroscopic views. A peri-articular reduction clamp was then used to further compress the plate to the lateral plateau and the K-wires holding the previous reduction of the lateral cortex were removed. A non-locking buttress screw was placed in the proximal metaphysis to bring the plate flush with bone.  A nonlocking 3.5 millimeter screw was placed in the anterior hole of the proximal portion of the plate to bring the plate close to the bone.  3 more locking screws were placed posterior to this gaining bicortical purchase into the medial plateau. I then made percutaneous incisions to place 3.5 mm nonlocking screws into the tibial shaft.  A oblique locking screw was placed from the lateral side into the medial condyle to provide another point of fixation.  Final fluoroscopic images were obtained.  I  then performed a stress with a valgus force showing that the knee was stable now.  I did not need to address the avulsion fracture off the medial femoral condyle.  The tourniquet was dropped and hemostasis was obtained. The incision was thoroughly irrigated. The tag sutures for the capsule were brought through the plate and tied down and the meniscus repair sutures were tied down to the capsule. One gram of vancomycin powder was placed in the wound and the IT band and anterior tibialis  fascia was closed with #1 vicryl suture. The skin was closed with 2-0 vicryl and 3-0 nylon. The percutaneous incisions were closed with 3-0 nylon suture. The incision was dressed with bacitracin ointment, adaptic and 4x4s. The knee and leg were dressed with sterile cast padding and an ACE wrap and was placed back in a knee immobilizer. The patient was then awoken from anesthesia and taken to PACU in stable condition.  Post Op Plan/Instructions: The patient will be nonweightbearing to the left lower extremity.  She will receive postoperative Ancef.  She received Lovenox for DVT prophylaxis.  We will plan to transition to a hinged knee brace in the next day or so.  She may start range of motion of the knee in the brace.  I would like her locked in extension while she sleeps and unlocked during the day.  I was present and performed the entire surgery.  Katha Hamming, MD [facility]

## 2017-09-22 NOTE — Progress Notes (Signed)
PT Cancellation Note  Patient Details Name: [name] MRN: [id number] DOB: 11/22/1945   Cancelled Treatment:    Reason Eval/Treat Not Completed: Patient at procedure or test/unavailable.  Pt is in PACU s/p ortho procedure.  PT will check on tomorrow.  Thanks,    [name]. [name], [name], DPT [phone]   09/22/2017, 1:51 PM

## 2017-09-22 NOTE — Anesthesia Postprocedure Evaluation (Signed)
Anesthesia Post Note  Patient: [name]  Procedure(s) Performed: OPEN REDUCTION INTERNAL FIXATION (ORIF) TIBIAL PLATEAU (Left )     Patient location during evaluation: PACU Anesthesia Type: General Level of consciousness: awake and alert Pain management: pain level controlled Vital Signs Assessment: post-procedure vital signs reviewed and stable Respiratory status: spontaneous breathing, nonlabored ventilation, respiratory function stable and patient connected to nasal cannula oxygen Cardiovascular status: blood pressure returned to baseline and stable Postop Assessment: no apparent nausea or vomiting Anesthetic complications: no    Last Vitals:  Vitals:   09/22/17 0803 09/22/17 1240  BP: (!) 172/64 (!) 198/99  Pulse: 73 85  Resp:  18  Temp: 37.1 C (!) 36.4 C  SpO2: 94% 99%    Last Pain:  Vitals:   09/22/17 0803  TempSrc: Oral  PainSc:                  Catalina Gravel

## 2017-09-22 NOTE — Progress Notes (Signed)
[facility] PROGRESS NOTE  [name] JYN:[id number] DOB: 05/28/1946 DOA: 09/20/2017   PCP: [name], MD     Assessment/Plan: Active Problems:   Hyperlipidemia   Lung cancer, Right upper lobe   Syncope   Hypokalemia   Hyponatremia   Tibial plateau fracture   Postural dizziness with presyncope    72 y.o. female with medical history significant of LUNG cancer sp resection sp chemotherapy complicated by right pleural effusion, COPD, history of syncopal spells, presents with a fall. Patient found to have a sodium of 128 on admission. X-ray showed commuted proximal tibial fracture. Orthopedics consulted   Assessment and plan  . Syncope - in the setting of severe pain, likley vaso vagal, versus alcohol dependence, not a true syncope given no LOC . Also concerned  about arrhythmia due to electrolyte abnormalities and alcohol dependence.  Given risk factors  repeated echo which shows  EF: 60% -   65%, Wall motion was normal; there were no regional wall motion  abnormalities   . Hyperlipidemia - stable continue home medicaitons   . Tibial plateau fracture - OPEN REDUCTION INTERNAL FIXATION (ORIF) TIBIAL PLATEAU 2/15   Appreciate orthopedics consult,  -  ORIF 2 /15    Patient not on anticoagulation or antiplatelet agents  At home Low to intermediate risk, given long-standing history of alcohol dependence ECG - normal sinus rhythm, telemetry shows normal sinus rhythm no known history of coronary artery disease, atrial fibrillation, CHF   . Mild CKD, stage 2  with stable Cr,1.25>1.0   . Hypokalemia -  repleted, magnesium also replaced  . Hyponatremia - long standing but worse from baseline - likely secondary to dehydration/alcohol use, improving with   IVF,   continue to monitor sodium, TSH within normal limits   . Lung cancer, Right upper lobe -chronic scarring at the right lung base on x-ray     DVT prophylaxsis  lovenox  preop, postop as per  orthopedics  Code Status:  full    Family Communication: Discussed in detail with the patient, all imaging results, lab results explained to the patient   Disposition Plan:  Will need SNF, PT OT consulted      Consultants:  ortho  Procedures:  None   Antibiotics: Anti-infectives (From admission, onward)   Start     Dose/Rate Route Frequency Ordered Stop   09/22/17 0840  ceFAZolin (ANCEF) IVPB 2g/100 mL premix     2 g 200 mL/hr over 30 Minutes Intravenous On call to O.R. 09/22/17 0840 09/23/17 0559   09/21/17 1115  ceFAZolin (ANCEF) IVPB 2g/100 mL premix  Status:  Discontinued     2 g 200 mL/hr over 30 Minutes Intravenous On call to O.R. 09/21/17 1100 09/21/17 1101         HPI/Subjective: Hypertensive overnight otherwise stable  Objective: Vitals:   09/21/17 2110 09/21/17 2300 09/22/17 0520 09/22/17 0803  BP: (!) 193/67 (!) 163/60 (!) 176/84 (!) 172/64  Pulse: 79  87 73  Resp: 18  18   Temp: 98.4 F (36.9 C)  99.1 F (37.3 C) 98.8 F (37.1 C)  TempSrc: Oral  Oral Oral  SpO2: 93%  94% 94%  Weight:   76.2 kg (167 lb 15.9 oz)   Height:        Intake/Output Summary (Last 24 hours) at 09/22/2017 0842 Last data filed at 09/22/2017 0600 Gross per 24 hour  Intake 2377.5 ml  Output -  Net 2377.5 ml  Exam:  Examination:  General exam: Appears calm and comfortable  Respiratory system: Clear to auscultation. Respiratory effort normal. Cardiovascular system: S1 & S2 heard, RRR. No JVD, murmurs, rubs, gallops or clicks. No pedal edema. Gastrointestinal system: Abdomen is nondistended, soft and nontender. No organomegaly or masses felt. Normal bowel sounds heard. Central nervous system: Alert and oriented. No focal neurological deficits. Extremities: Symmetric 5 x 5 power. Skin: No rashes, lesions or ulcers Psychiatry: Judgement and insight appear normal. Mood & affect appropriate.     Data Reviewed: I have personally reviewed following labs and imaging  studies  Micro Results Recent Results (from the past 240 hour(s))  Surgical PCR screen     Status: None   Collection Time: 09/21/17  2:45 PM  Result Value Ref Range Status   MRSA, PCR NEGATIVE NEGATIVE Final   Staphylococcus aureus NEGATIVE NEGATIVE Final    Comment: (NOTE) The Xpert SA Assay (FDA approved for NASAL specimens in patients 5 years of age and older), is one component of a comprehensive surveillance program. It is not intended to diagnose infection nor to guide or monitor treatment. Performed at [facility], [place] [address], [place], [place] [address]     Radiology Reports Dg Chest 1 View  Result Date: 09/20/2017 CLINICAL DATA:  Slipped and fell on water at restaurant. EXAM: CHEST 1 VIEW COMPARISON:  Chest radiograph June 01, 2017 FINDINGS: Cardiac silhouette is mildly enlarged and unchanged. Mildly calcified aortic knob. Chronic RIGHT lung base pleural thickening and scarring. No focal consolidation. No pneumothorax. Included osseous structures are non suspicious. IMPRESSION: Stable cardiomegaly and RIGHT lung base pleural thickening/scarring. Electronically Signed   By: Elon Alas M.D.   On: 09/20/2017 22:19   Dg Thoracic Spine 2 View  Result Date: 09/20/2017 CLINICAL DATA:  72 year old female status post slip and fall at rest trauma. Pain. Personal history of lung cancer status post right upper lobectomy. EXAM: THORACIC SPINE 2 VIEWS COMPARISON:  Chest CT 07/06/2017 FINDINGS: Thoracic segmentation appears normal. There is levoconvex scoliosis of the lower thoracic spine in conjunction with lumbar scoliosis, resulting in some rotation of the lower thoracic vertebrae. Superimposed flowing lower thoracic endplate osteophytes. T9 butterfly vertebra, more apparent on the comparison CT. Cervicothoracic junction alignment is within normal limits. Stable thoracic vertebral height and alignment. Posterior ribs appear intact. Visualized thoracic visceral contours are  stable. IMPRESSION: No acute osseous abnormality identified in the thoracic spine. Electronically Signed   By: Genevie Ann M.D.   On: 09/20/2017 22:20   Dg Lumbar Spine Complete  Result Date: 09/20/2017 CLINICAL DATA:  72 year old female status post slip and fall at restaurant. Pain. EXAM: LUMBAR SPINE - COMPLETE 4+ VIEW COMPARISON:  Lumbar radiographs 09/20/2015. FINDINGS: Chronic moderate to severe lumbar scoliosis with a rotatory component. Stable lumbar vertebral height and alignment since 2017. Associated chronic severe lumbar disc space loss and bulky endplate osteophytosis. Stable visible sacrum and SI joints. Visible lower thoracic levels appear stable. No acute osseous abnormality identified. Negative visible bowel gas pattern. Calcified aortic atherosclerosis. IMPRESSION: 1.  No acute osseous abnormality identified in the lumbar spine. 2. Chronic severe lumbar scoliosis, disc, and endplate degeneration. Electronically Signed   By: Genevie Ann M.D.   On: 09/20/2017 22:14   Ct Knee Left Wo Contrast  Result Date: 09/21/2017 CLINICAL DATA:  Fracture of the knee after fall. EXAM: CT OF THE  KNEE WITHOUT CONTRAST TECHNIQUE: Multidetector CT imaging of the knee was performed according to the standard protocol. Multiplanar CT image  reconstructions were also generated. COMPARISON:  None. FINDINGS: Bones/Joint/Cartilage There is an acute, closed, bicondylar fracture of the tibial plateau with up to 19 mm of central depression involving the lateral tibial plateau and with comminution. A sagittal oblique fracture without depression involving the medial tibial plateau is also noted exiting through the medial tibial metadiaphysis, series 6, image 53. Fracture also appears to undermining the tibial spine. There is an associated moderate suprapatellar lipohemarthrosis. A comminuted fibular head and neck fracture is noted without displacement. A fracture along the lateral cortex of the medial femoral condyle is also  present. Ligaments Suboptimally assessed by CT. Given fracture of the medial femoral condyle along the expected course of the MCL, injury to the MCL is not excluded. Muscles and Tendons No intramuscular hemorrhage or atrophy. Soft tissues Posttraumatic subcutaneous soft tissue induration about the knee. IMPRESSION: 1. Acute bicondylar fracture of the tibial plateau with central depression of the lateral tibial plateau to 19 mm with comminution. Sagittal oblique fracture through the medial tibial plateau is noted. Fracture appears to also undermines the tibial spine. Findings would be in keeping with a Schatzker type IV fracture. 2. Nondisplaced fracture of the medial femoral condyle along its outer cortex. Suspect medial collateral ligament injury given location of this fracture. 3. Comminuted fibular head and neck fracture without displacement. 4. Lipohemarthrosis. Electronically Signed   By: Ashley Royalty M.D.   On: 09/21/2017 01:07   Portable Chest 1 View  Result Date: 09/21/2017 CLINICAL DATA:  Preoperative evaluation.  History of lung cancer. EXAM: PORTABLE CHEST 1 VIEW COMPARISON:  Chest radiograph September 20, 2017 FINDINGS: Cardiac silhouette is mildly enlarged. Mildly calcified aortic arch. Similar hyperinflation with RIGHT lung base pleural thickening and scarring. RIGHT apical opacification and volume loss. Trachea projects midline and there is no pneumothorax. Soft tissue planes and included osseous structures are non-suspicious. IMPRESSION: Stable cardiomegaly and RIGHT lung base pleural thickening/scarring. RIGHT apical scarring, less likely consolidation. Aortic Atherosclerosis (ICD10-I70.0). Electronically Signed   By: Elon Alas M.D.   On: 09/21/2017 02:59   Dg Knee Complete 4 Views Left  Result Date: 09/20/2017 CLINICAL DATA:  72 year old female slipped and fell at restaurant with severe left knee pain. EXAM: LEFT KNEE - COMPLETE 4+ VIEW COMPARISON:  None. FINDINGS: No straight AP view  of the left knee is provided. Comminuted nondisplaced fracture of the left fibula head is suspected. Comminuted fracture of the proximal left tibia is identified and is associated with depression of the left lateral tibial plateau. The medial plateau appears to remain intact. The patella appears intact. The distal left femur appears intact. Only a small joint effusion is evident. IMPRESSION: 1. Comminuted fracture of the proximal left tibia with depressed lateral tibial plateau. 2. Nondisplaced comminuted fracture of the left fibula head. Electronically Signed   By: Genevie Ann M.D.   On: 09/20/2017 22:17     CBC Recent Labs  Lab 09/20/17 2119 09/21/17 0337 09/22/17 0517  WBC 12.5* 10.4 7.0  HGB 13.7 11.8* 11.2*  HCT 38.1 34.2* 33.6*  PLT 234 209 170  MCV 94.3 95.5 96.3  MCH 33.9 33.0 32.1  MCHC 36.0 34.5 33.3  RDW 12.6 12.8 12.9  LYMPHSABS  --  1.2  --   MONOABS  --  0.5  --   EOSABS  --  0.0  --   BASOSABS  --  0.0  --     Chemistries  Recent Labs  Lab 09/20/17 2119 09/21/17 0337 09/21/17 1414 09/22/17 0517  NA  128* 129*  --  130*  K 3.4* 3.8  --  3.8  CL 93* 94*  --  98*  CO2 19* 23  --  22  GLUCOSE 130* 148*  --  122*  BUN 15 12  --  7  CREATININE 1.25* 1.00  --  0.97  CALCIUM 9.5 8.7*  --  8.7*  MG  --   --  1.6* 2.0  AST  --  28  --  22  ALT  --  21  --  15  ALKPHOS  --  97  --  87  BILITOT  --  0.7  --  0.6   ------------------------------------------------------------------------------------------------------------------ estimated creatinine clearance is 53.5 mL/min (by C-G formula based on SCr of 0.97 mg/dL). ------------------------------------------------------------------------------------------------------------------ No results for input(s): HGBA1C in the last 72 hours. ------------------------------------------------------------------------------------------------------------------ No results for input(s): CHOL, HDL, LDLCALC, TRIG, CHOLHDL, LDLDIRECT in the  last 72 hours. ------------------------------------------------------------------------------------------------------------------ Recent Labs    09/21/17 1414  TSH 2.158   ------------------------------------------------------------------------------------------------------------------ No results for input(s): VITAMINB12, FOLATE, FERRITIN, TIBC, IRON, RETICCTPCT in the last 72 hours.  Coagulation profile No results for input(s): INR, PROTIME in the last 168 hours.  No results for input(s): DDIMER in the last 72 hours.  Cardiac Enzymes Recent Labs  Lab 09/21/17 0210 09/21/17 0337 09/21/17 1123  TROPONINI <0.03 <0.03 <0.03   ------------------------------------------------------------------------------------------------------------------ Invalid input(s): POCBNP   CBG: Recent Labs  Lab 09/21/17 0533 09/22/17 0814  GLUCAP 163* 98       Studies: Dg Chest 1 View  Result Date: 09/20/2017 CLINICAL DATA:  Slipped and fell on water at restaurant. EXAM: CHEST 1 VIEW COMPARISON:  Chest radiograph June 01, 2017 FINDINGS: Cardiac silhouette is mildly enlarged and unchanged. Mildly calcified aortic knob. Chronic RIGHT lung base pleural thickening and scarring. No focal consolidation. No pneumothorax. Included osseous structures are non suspicious. IMPRESSION: Stable cardiomegaly and RIGHT lung base pleural thickening/scarring. Electronically Signed   By: Elon Alas M.D.   On: 09/20/2017 22:19   Dg Thoracic Spine 2 View  Result Date: 09/20/2017 CLINICAL DATA:  72 year old female status post slip and fall at rest trauma. Pain. Personal history of lung cancer status post right upper lobectomy. EXAM: THORACIC SPINE 2 VIEWS COMPARISON:  Chest CT 07/06/2017 FINDINGS: Thoracic segmentation appears normal. There is levoconvex scoliosis of the lower thoracic spine in conjunction with lumbar scoliosis, resulting in some rotation of the lower thoracic vertebrae. Superimposed flowing  lower thoracic endplate osteophytes. T9 butterfly vertebra, more apparent on the comparison CT. Cervicothoracic junction alignment is within normal limits. Stable thoracic vertebral height and alignment. Posterior ribs appear intact. Visualized thoracic visceral contours are stable. IMPRESSION: No acute osseous abnormality identified in the thoracic spine. Electronically Signed   By: Genevie Ann M.D.   On: 09/20/2017 22:20   Dg Lumbar Spine Complete  Result Date: 09/20/2017 CLINICAL DATA:  72 year old female status post slip and fall at restaurant. Pain. EXAM: LUMBAR SPINE - COMPLETE 4+ VIEW COMPARISON:  Lumbar radiographs 09/20/2015. FINDINGS: Chronic moderate to severe lumbar scoliosis with a rotatory component. Stable lumbar vertebral height and alignment since 2017. Associated chronic severe lumbar disc space loss and bulky endplate osteophytosis. Stable visible sacrum and SI joints. Visible lower thoracic levels appear stable. No acute osseous abnormality identified. Negative visible bowel gas pattern. Calcified aortic atherosclerosis. IMPRESSION: 1.  No acute osseous abnormality identified in the lumbar spine. 2. Chronic severe lumbar scoliosis, disc, and endplate degeneration. Electronically Signed   By: Genevie Ann M.D.   On:  09/20/2017 22:14   Ct Knee Left Wo Contrast  Result Date: 09/21/2017 CLINICAL DATA:  Fracture of the knee after fall. EXAM: CT OF THE  KNEE WITHOUT CONTRAST TECHNIQUE: Multidetector CT imaging of the knee was performed according to the standard protocol. Multiplanar CT image reconstructions were also generated. COMPARISON:  None. FINDINGS: Bones/Joint/Cartilage There is an acute, closed, bicondylar fracture of the tibial plateau with up to 19 mm of central depression involving the lateral tibial plateau and with comminution. A sagittal oblique fracture without depression involving the medial tibial plateau is also noted exiting through the medial tibial metadiaphysis, series 6, image  53. Fracture also appears to undermining the tibial spine. There is an associated moderate suprapatellar lipohemarthrosis. A comminuted fibular head and neck fracture is noted without displacement. A fracture along the lateral cortex of the medial femoral condyle is also present. Ligaments Suboptimally assessed by CT. Given fracture of the medial femoral condyle along the expected course of the MCL, injury to the MCL is not excluded. Muscles and Tendons No intramuscular hemorrhage or atrophy. Soft tissues Posttraumatic subcutaneous soft tissue induration about the knee. IMPRESSION: 1. Acute bicondylar fracture of the tibial plateau with central depression of the lateral tibial plateau to 19 mm with comminution. Sagittal oblique fracture through the medial tibial plateau is noted. Fracture appears to also undermines the tibial spine. Findings would be in keeping with a Schatzker type IV fracture. 2. Nondisplaced fracture of the medial femoral condyle along its outer cortex. Suspect medial collateral ligament injury given location of this fracture. 3. Comminuted fibular head and neck fracture without displacement. 4. Lipohemarthrosis. Electronically Signed   By: Ashley Royalty M.D.   On: 09/21/2017 01:07   Portable Chest 1 View  Result Date: 09/21/2017 CLINICAL DATA:  Preoperative evaluation.  History of lung cancer. EXAM: PORTABLE CHEST 1 VIEW COMPARISON:  Chest radiograph September 20, 2017 FINDINGS: Cardiac silhouette is mildly enlarged. Mildly calcified aortic arch. Similar hyperinflation with RIGHT lung base pleural thickening and scarring. RIGHT apical opacification and volume loss. Trachea projects midline and there is no pneumothorax. Soft tissue planes and included osseous structures are non-suspicious. IMPRESSION: Stable cardiomegaly and RIGHT lung base pleural thickening/scarring. RIGHT apical scarring, less likely consolidation. Aortic Atherosclerosis (ICD10-I70.0). Electronically Signed   By: Elon Alas M.D.   On: 09/21/2017 02:59   Dg Knee Complete 4 Views Left  Result Date: 09/20/2017 CLINICAL DATA:  72 year old female slipped and fell at restaurant with severe left knee pain. EXAM: LEFT KNEE - COMPLETE 4+ VIEW COMPARISON:  None. FINDINGS: No straight AP view of the left knee is provided. Comminuted nondisplaced fracture of the left fibula head is suspected. Comminuted fracture of the proximal left tibia is identified and is associated with depression of the left lateral tibial plateau. The medial plateau appears to remain intact. The patella appears intact. The distal left femur appears intact. Only a small joint effusion is evident. IMPRESSION: 1. Comminuted fracture of the proximal left tibia with depressed lateral tibial plateau. 2. Nondisplaced comminuted fracture of the left fibula head. Electronically Signed   By: Genevie Ann M.D.   On: 09/20/2017 22:17      No results found for: HGBA1C Lab Results  Component Value Date   CREATININE 0.97 09/22/2017       Scheduled Meds: . folic acid  1 mg Oral Daily  . losartan  50 mg Oral Daily  . metoprolol succinate  50 mg Oral Daily  . multivitamin with minerals  1 tablet Oral  Daily  . pantoprazole  40 mg Oral Daily  . rOPINIRole  0.5 mg Oral QHS  . simvastatin  20 mg Oral QPM  . sodium chloride flush  3 mL Intravenous Q12H  . thiamine  100 mg Oral Daily   Or  . thiamine  100 mg Intravenous Daily   Continuous Infusions: . 0.9 % NaCl with KCl 20 mEq / L 75 mL/hr at 09/22/17 0539  .  ceFAZolin (ANCEF) IV    . methocarbamol (ROBAXIN)  IV       LOS: 1 day    Time spent: >30 MINS    Reyne Dumas  [facility] Pager [phone]. If 7PM-7AM, please contact night-coverage at [email or web address], password [facility] 09/22/2017, 8:42 AM  LOS: 1 day

## 2017-09-22 NOTE — Interval H&P Note (Signed)
History and Physical Interval Note:  09/22/2017 9:36 AM  [name]  has presented today for surgery, with the diagnosis of Left tibial plateau fracture  The various methods of treatment have been discussed with the patient and family. After consideration of risks, benefits and other options for treatment, the patient has consented to  Procedure(s): OPEN REDUCTION INTERNAL FIXATION (ORIF) TIBIAL PLATEAU (Left) as a surgical intervention .  The patient's history has been reviewed, patient examined, no change in status, stable for surgery.  I have reviewed the patient's chart and labs.  Questions were answered to the patient's satisfaction.     [name], Thomasene Lot

## 2017-09-22 NOTE — Progress Notes (Signed)
Orthopedic Tech Progress Note Patient Details:  [name] [facility] 23-May-1946 [id number]  Patient ID: [name], female   DOB: 08-24-1945, 72 y.o.   MRN: [id number]   [place] Pink 09/22/2017, 3:48 PMCalled [employer] for left Hinged knee brace.

## 2017-09-22 NOTE — Anesthesia Preprocedure Evaluation (Addendum)
Anesthesia Evaluation  Patient identified by MRN, date of birth, ID band Patient awake    Reviewed: Allergy & Precautions, NPO status , Patient's Chart, lab work & pertinent test results, reviewed documented beta blocker date and time   Airway Mallampati: II  TM Distance: <3 FB Neck ROM: Full    Dental  (+) Teeth Intact, Dental Advisory Given, Missing   Pulmonary former smoker,  H/o right lung cancer s/p resection   Pulmonary exam normal breath sounds clear to auscultation       Cardiovascular hypertension, Pt. on medications and Pt. on home beta blockers + Peripheral Vascular Disease  Normal cardiovascular exam Rhythm:Regular Rate:Normal  Echo 09/21/17: Study Conclusions  - Left ventricle: The cavity size was normal. Wall thickness was increased in a pattern of mild LVH. Systolic function was normal. The estimated ejection fraction was in the range of 60% to 65%. Wall motion was normal; there were no regional wall motion abnormalities. Doppler parameters are consistent with abnormal left ventricular relaxation (grade 1 diastolic dysfunction). The   E/e&' ratio is >15, suggesting elevated LV filling pressure. - Aortic valve: Trileaflet. There was no stenosis. There was trivial regurgitation. - Mitral valve: Mildly thickened leaflets . There was trivial   Regurgitation. - Left atrium: The atrium was normal in size. - Tricuspid valve: There was mild regurgitation. - Pulmonary arteries: PA peak pressure: 39 mm Hg (S). - Inferior vena cava: The vessel was normal in size. The   respirophasic diameter changes were in the normal range (>= 50%), consistent with normal central venous pressure.   Neuro/Psych negative neurological ROS  negative psych ROS   GI/Hepatic Neg liver ROS, GERD  Medicated,  Endo/Other  negative endocrine ROS  Renal/GU Renal InsufficiencyRenal disease     Musculoskeletal  (+) Arthritis ,   Abdominal    Peds  Hematology  (+) Blood dyscrasia, anemia ,   Anesthesia Other Findings Day of surgery medications reviewed with the patient.  Reproductive/Obstetrics                            Anesthesia Physical Anesthesia Plan  ASA: III  Anesthesia Plan: General   Post-op Pain Management:    Induction: Intravenous  PONV Risk Score and Plan: 3 and Dexamethasone, Ondansetron and Treatment may vary due to age or medical condition  Airway Management Planned: Oral ETT  Additional Equipment:   Intra-op Plan:   Post-operative Plan: Extubation in OR  Informed Consent: I have reviewed the patients History and Physical, chart, labs and discussed the procedure including the risks, benefits and alternatives for the proposed anesthesia with the patient or authorized representative who has indicated his/her understanding and acceptance.   Dental advisory given  Plan Discussed with: CRNA  Anesthesia Plan Comments: (Risks/benefits of general anesthesia discussed with patient including risk of damage to teeth, lips, gum, and tongue, nausea/vomiting, allergic reactions to medications, and the possibility of heart attack, stroke and death.  All patient questions answered.  Patient wishes to proceed.)        Anesthesia Quick Evaluation

## 2017-09-22 NOTE — Anesthesia Procedure Notes (Signed)
Procedure Name: Intubation Date/Time: 09/22/2017 9:56 AM Performed by: Clearnce Sorrel, CRNA Pre-anesthesia Checklist: Patient identified, Emergency Drugs available, Suction available, Patient being monitored and Timeout performed Patient Re-evaluated:Patient Re-evaluated prior to induction Oxygen Delivery Method: Circle system utilized Preoxygenation: Pre-oxygenation with 100% oxygen Induction Type: IV induction Ventilation: Mask ventilation without difficulty Laryngoscope Size: Mac and 3 Grade View: Grade I Tube size: 7.0 mm Number of attempts: 1 Airway Equipment and Method: Stylet Placement Confirmation: ETT inserted through vocal cords under direct vision,  positive ETCO2 and breath sounds checked- equal and bilateral Secured at: 23 cm Tube secured with: Tape Dental Injury: Teeth and Oropharynx as per pre-operative assessment

## 2017-09-22 NOTE — Transfer of Care (Signed)
Immediate Anesthesia Transfer of Care Note  Patient: [name]  Procedure(s) Performed: OPEN REDUCTION INTERNAL FIXATION (ORIF) TIBIAL PLATEAU (Left )  Patient Location: PACU  Anesthesia Type:General  Level of Consciousness: awake, alert  and oriented  Airway & Oxygen Therapy: Patient Spontanous Breathing and Patient connected to nasal cannula oxygen  Post-op Assessment: Report given to RN and Post -op Vital signs reviewed and stable  Post vital signs: Reviewed and stable  Last Vitals:  Vitals:   09/22/17 0520 09/22/17 0803  BP: (!) 176/84 (!) 172/64  Pulse: 87 73  Resp: 18   Temp: 37.3 C 37.1 C  SpO2: 94% 94%    Last Pain:  Vitals:   09/22/17 0803  TempSrc: Oral  PainSc:       Patients Stated Pain Goal: 3 (28/63/81 7711)  Complications: No apparent anesthesia complications

## 2017-09-23 LAB — CBC
HEMATOCRIT: 29.4 % — AB (ref 36.0–46.0)
Hemoglobin: 10 g/dL — ABNORMAL LOW (ref 12.0–15.0)
MCH: 32.5 pg (ref 26.0–34.0)
MCHC: 34 g/dL (ref 30.0–36.0)
MCV: 95.5 fL (ref 78.0–100.0)
Platelets: 153 10*3/uL (ref 150–400)
RBC: 3.08 MIL/uL — ABNORMAL LOW (ref 3.87–5.11)
RDW: 12.9 % (ref 11.5–15.5)
WBC: 10.4 10*3/uL (ref 4.0–10.5)

## 2017-09-23 LAB — BASIC METABOLIC PANEL
Anion gap: 11 (ref 5–15)
BUN: 8 mg/dL (ref 6–20)
CHLORIDE: 97 mmol/L — AB (ref 101–111)
CO2: 23 mmol/L (ref 22–32)
Calcium: 8.6 mg/dL — ABNORMAL LOW (ref 8.9–10.3)
Creatinine, Ser: 0.99 mg/dL (ref 0.44–1.00)
GFR calc Af Amer: >60 mL/min (ref >60)
GFR calc non Af Amer: 56 mL/min — ABNORMAL LOW (ref >60)
GLUCOSE: 149 mg/dL — AB (ref 65–99)
POTASSIUM: 4.3 mmol/L (ref 3.5–5.1)
Sodium: 131 mmol/L — ABNORMAL LOW (ref 135–145)

## 2017-09-23 LAB — GLUCOSE, CAPILLARY: Glucose-Capillary: 120 mg/dL — ABNORMAL HIGH (ref 65–99)

## 2017-09-23 MED ORDER — DOCUSATE SODIUM 100 MG PO CAPS
100.0000 mg | ORAL_CAPSULE | Freq: Two times a day (BID) | ORAL | Status: DC
  Administered 2017-09-23 – 2017-09-26 (×7): 100 mg via ORAL
  Filled 2017-09-23 (×7): qty 1

## 2017-09-23 NOTE — Progress Notes (Signed)
Patient ID: [name], female   DOB: 09-03-1945, 72 y.o.   MRN: [id number]     Subjective:  Patient reports pain as mild to moderate.  Patient in be working with PT but complaining of nausea.  Objective:   VITALS:   Vitals:   09/23/17 0416 09/23/17 0500 09/23/17 0814 09/23/17 1205  BP: (!) 170/66 (!) 141/68 (!) 133/53 (!) 161/63  Pulse:  90 96 87  Resp:  18 16 18   Temp:   99 F (37.2 C) 98.7 F (37.1 C)  TempSrc:  Oral Oral Oral  SpO2:  97% 93% 95%  Weight:      Height:        Sensation intact distally Dorsiflexion/Plantar flexion intact Incision: dressing C/D/I and no drainage  Patient is passing gas Positive for nausea ABD mildly distended non tender to palpation   Lab Results  Component Value Date   WBC 10.4 09/23/2017   HGB 10.0 (L) 09/23/2017   HCT 29.4 (L) 09/23/2017   MCV 95.5 09/23/2017   PLT 153 09/23/2017   BMET    Component Value Date/Time   NA 131 (L) 09/23/2017 0349   NA 130 (L) 07/06/2017 0832   K 4.3 09/23/2017 0349   K 4.3 07/06/2017 0832   CL 97 (L) 09/23/2017 0349   CO2 23 09/23/2017 0349   CO2 25 07/06/2017 0832   GLUCOSE 149 (H) 09/23/2017 0349   GLUCOSE 96 07/06/2017 0832   BUN 8 09/23/2017 0349   BUN 14.8 07/06/2017 0832   CREATININE 0.99 09/23/2017 0349   CREATININE 1.2 (H) 07/06/2017 0832   CALCIUM 8.6 (L) 09/23/2017 0349   CALCIUM 9.5 07/06/2017 0832   GFRNONAA 56 (L) 09/23/2017 0349   GFRAA >60 09/23/2017 0349     Assessment/Plan: 1 Day Post-Op   Active Problems:   Hyperlipidemia   Lung cancer, Right upper lobe   Syncope   Hypokalemia   Hyponatremia   Tibial plateau fracture   Postural dizziness with presyncope   Move to clear liquids Continue to monitor bowel function Up with therapy NWB left lower ext continue plan per medicine Brace open during the day and locked at night  Dry dressing PRN Planning for SNF    Remonia Richter 09/23/2017, 2:05 PM   Marchia Bond, MD Cell [phone]

## 2017-09-23 NOTE — Progress Notes (Addendum)
Patient ID: [name], female   DOB: Dec 02, 1945, 72 y.o.   MRN: [id number]  PROGRESS NOTE    [name]  VOJ:[id number] DOB: 07-03-1946 DOA: 09/20/2017  PCP: [name], MD   Brief Narrative:  72 y.o.femalewith medical history significant of LUNG cancer sp resectionspchemotherapy complicated by right pleural effusion, COPD, history of syncopal spells, presents with a fall. Patient found to have a sodium of 128. X-ray showed commuted proximal tibial fracture.   Assessment & Plan:   Syncope - Vaso vagal, concern however alcohol dependence and electrolyte abnormalities - PT eval appreciated   Hyperlipidemia - Stable   Tibial plateau fracture - Management per ortho  CKD, stage 2 - Stable Cr   Hypokalemia - Supplemented - Check BMP in am  Hyponatremia - Long standing - Worsening on admission due to dehydration - Check BMP in am  Lung cancer - Right upper lung lobe - Per oncology    DVT prophylaxis: SCD's bilaterally  Code Status: full code  Family Communication: no family at the bedside  Disposition Plan: home once stable    Consultants:   Ortho  PT   Procedures:   None   Antimicrobials:   None    Subjective: No overnight events.  Objective: Vitals:   09/23/17 0416 09/23/17 0500 09/23/17 0814 09/23/17 1205  BP: (!) 170/66 (!) 141/68 (!) 133/53 (!) 161/63  Pulse:  90 96 87  Resp:  18 16 18   Temp:   99 F (37.2 C) 98.7 F (37.1 C)  TempSrc:  Oral Oral Oral  SpO2:  97% 93% 95%  Weight:      Height:        Intake/Output Summary (Last 24 hours) at 09/23/2017 1731 Last data filed at 09/23/2017 1600 Gross per 24 hour  Intake 2465 ml  Output 1405 ml  Net 1060 ml   Filed Weights   09/21/17 1411 09/22/17 0520 09/23/17 0330  Weight: 70.3 kg (155 lb) 76.2 kg (167 lb 15.9 oz) 77.1 kg (170 lb)    Examination:  General exam: Appears calm and comfortable  Respiratory system: Diminished breath sounds, no wheezing    Cardiovascular system: S1 & S2 heard, RRR.  Gastrointestinal system: Abdomen is nondistended, soft and nontender. No organomegaly or masses felt. Normal bowel sounds heard. Central nervous system: Alert and oriented. No focal neurological deficits. Extremities: Pain in left leg, limited range of motion due to fracture  Skin: No rashes, lesions or ulcers Psychiatry: Judgement and insight appear normal. Mood & affect appropriate.   Data Reviewed: I have personally reviewed following labs and imaging studies  CBC: Recent Labs  Lab 09/20/17 2119 09/21/17 0337 09/22/17 0517 09/23/17 0349  WBC 12.5* 10.4 7.0 10.4  NEUTROABS  --  8.7*  --   --   HGB 13.7 11.8* 11.2* 10.0*  HCT 38.1 34.2* 33.6* 29.4*  MCV 94.3 95.5 96.3 95.5  PLT 234 209 170 993   Basic Metabolic Panel: Recent Labs  Lab 09/20/17 2119 09/21/17 0337 09/21/17 1414 09/22/17 0517 09/23/17 0349  NA 128* 129*  --  130* 131*  K 3.4* 3.8  --  3.8 4.3  CL 93* 94*  --  98* 97*  CO2 19* 23  --  22 23  GLUCOSE 130* 148*  --  122* 149*  BUN 15 12  --  7 8  CREATININE 1.25* 1.00  --  0.97 0.99  CALCIUM 9.5 8.7*  --  8.7* 8.6*  MG  --   --  1.6* 2.0  --    GFR: Estimated Creatinine Clearance: 52.7 mL/min (by C-G formula based on SCr of 0.99 mg/dL). Liver Function Tests: Recent Labs  Lab 09/21/17 0337 09/22/17 0517  AST 28 22  ALT 21 15  ALKPHOS 97 87  BILITOT 0.7 0.6  PROT 6.3* 5.9*  ALBUMIN 4.1 3.6   No results for input(s): LIPASE, AMYLASE in the last 168 hours. No results for input(s): AMMONIA in the last 168 hours. Coagulation Profile: No results for input(s): INR, PROTIME in the last 168 hours. Cardiac Enzymes: Recent Labs  Lab 09/21/17 0210 09/21/17 0337 09/21/17 1123  TROPONINI <0.03 <0.03 <0.03   BNP (last 3 results) No results for input(s): PROBNP in the last 8760 hours. HbA1C: No results for input(s): HGBA1C in the last 72 hours. CBG: Recent Labs  Lab 09/21/17 0533 09/22/17 0814  09/23/17 0800  GLUCAP 163* 98 120*   Lipid Profile: No results for input(s): CHOL, HDL, LDLCALC, TRIG, CHOLHDL, LDLDIRECT in the last 72 hours. Thyroid Function Tests: Recent Labs    09/21/17 1414  TSH 2.158  FREET4 1.24*   Anemia Panel: No results for input(s): VITAMINB12, FOLATE, FERRITIN, TIBC, IRON, RETICCTPCT in the last 72 hours. Urine analysis:    Component Value Date/Time   COLORURINE STRAW (A) 09/21/2017 0210   APPEARANCEUR CLEAR 09/21/2017 0210   LABSPEC 1.008 09/21/2017 0210   PHURINE 5.0 09/21/2017 0210   GLUCOSEU NEGATIVE 09/21/2017 0210   HGBUR SMALL (A) 09/21/2017 0210   BILIRUBINUR NEGATIVE 09/21/2017 0210   KETONESUR NEGATIVE 09/21/2017 0210   PROTEINUR NEGATIVE 09/21/2017 0210   UROBILINOGEN 0.2 07/03/2013 0839   NITRITE NEGATIVE 09/21/2017 0210   LEUKOCYTESUR NEGATIVE 09/21/2017 0210   Sepsis Labs: @LABRCNTIP (procalcitonin:4,lacticidven:4)    Surgical PCR screen     Status: None   Collection Time: 09/21/17  2:45 PM  Result Value Ref Range Status   MRSA, PCR NEGATIVE NEGATIVE Final   Staphylococcus aureus NEGATIVE NEGATIVE Final      Radiology Studies: Dg Chest 1 View  Result Date: 09/20/2017 CLINICAL DATA:  Slipped and fell on water at restaurant. EXAM: CHEST 1 VIEW COMPARISON:  Chest radiograph June 01, 2017 FINDINGS: Cardiac silhouette is mildly enlarged and unchanged. Mildly calcified aortic knob. Chronic RIGHT lung base pleural thickening and scarring. No focal consolidation. No pneumothorax. Included osseous structures are non suspicious. IMPRESSION: Stable cardiomegaly and RIGHT lung base pleural thickening/scarring. Electronically Signed   By: Elon Alas M.D.   On: 09/20/2017 22:19   Dg Thoracic Spine 2 View  Result Date: 09/20/2017 CLINICAL DATA:  72 year old female status post slip and fall at rest trauma. Pain. Personal history of lung cancer status post right upper lobectomy. EXAM: THORACIC SPINE 2 VIEWS COMPARISON:  Chest CT  07/06/2017 FINDINGS: Thoracic segmentation appears normal. There is levoconvex scoliosis of the lower thoracic spine in conjunction with lumbar scoliosis, resulting in some rotation of the lower thoracic vertebrae. Superimposed flowing lower thoracic endplate osteophytes. T9 butterfly vertebra, more apparent on the comparison CT. Cervicothoracic junction alignment is within normal limits. Stable thoracic vertebral height and alignment. Posterior ribs appear intact. Visualized thoracic visceral contours are stable. IMPRESSION: No acute osseous abnormality identified in the thoracic spine. Electronically Signed   By: Genevie Ann M.D.   On: 09/20/2017 22:20   Dg Lumbar Spine Complete  Result Date: 09/20/2017 CLINICAL DATA:  72 year old female status post slip and fall at restaurant. Pain. EXAM: LUMBAR SPINE - COMPLETE 4+ VIEW COMPARISON:  Lumbar radiographs 09/20/2015. FINDINGS: Chronic moderate to  severe lumbar scoliosis with a rotatory component. Stable lumbar vertebral height and alignment since 2017. Associated chronic severe lumbar disc space loss and bulky endplate osteophytosis. Stable visible sacrum and SI joints. Visible lower thoracic levels appear stable. No acute osseous abnormality identified. Negative visible bowel gas pattern. Calcified aortic atherosclerosis. IMPRESSION: 1.  No acute osseous abnormality identified in the lumbar spine. 2. Chronic severe lumbar scoliosis, disc, and endplate degeneration. Electronically Signed   By: Genevie Ann M.D.   On: 09/20/2017 22:14   Ct Knee Left Wo Contrast  Result Date: 09/21/2017 CLINICAL DATA:  Fracture of the knee after fall. EXAM: CT OF THE  KNEE WITHOUT CONTRAST TECHNIQUE: Multidetector CT imaging of the knee was performed according to the standard protocol. Multiplanar CT image reconstructions were also generated. COMPARISON:  None. FINDINGS: Bones/Joint/Cartilage There is an acute, closed, bicondylar fracture of the tibial plateau with up to 19 mm of  central depression involving the lateral tibial plateau and with comminution. A sagittal oblique fracture without depression involving the medial tibial plateau is also noted exiting through the medial tibial metadiaphysis, series 6, image 53. Fracture also appears to undermining the tibial spine. There is an associated moderate suprapatellar lipohemarthrosis. A comminuted fibular head and neck fracture is noted without displacement. A fracture along the lateral cortex of the medial femoral condyle is also present. Ligaments Suboptimally assessed by CT. Given fracture of the medial femoral condyle along the expected course of the MCL, injury to the MCL is not excluded. Muscles and Tendons No intramuscular hemorrhage or atrophy. Soft tissues Posttraumatic subcutaneous soft tissue induration about the knee. IMPRESSION: 1. Acute bicondylar fracture of the tibial plateau with central depression of the lateral tibial plateau to 19 mm with comminution. Sagittal oblique fracture through the medial tibial plateau is noted. Fracture appears to also undermines the tibial spine. Findings would be in keeping with a Schatzker type IV fracture. 2. Nondisplaced fracture of the medial femoral condyle along its outer cortex. Suspect medial collateral ligament injury given location of this fracture. 3. Comminuted fibular head and neck fracture without displacement. 4. Lipohemarthrosis. Electronically Signed   By: Ashley Royalty M.D.   On: 09/21/2017 01:07   Portable Chest 1 View  Result Date: 09/21/2017 CLINICAL DATA:  Preoperative evaluation.  History of lung cancer. EXAM: PORTABLE CHEST 1 VIEW COMPARISON:  Chest radiograph September 20, 2017 FINDINGS: Cardiac silhouette is mildly enlarged. Mildly calcified aortic arch. Similar hyperinflation with RIGHT lung base pleural thickening and scarring. RIGHT apical opacification and volume loss. Trachea projects midline and there is no pneumothorax. Soft tissue planes and included osseous  structures are non-suspicious. IMPRESSION: Stable cardiomegaly and RIGHT lung base pleural thickening/scarring. RIGHT apical scarring, less likely consolidation. Aortic Atherosclerosis (ICD10-I70.0). Electronically Signed   By: Elon Alas M.D.   On: 09/21/2017 02:59   Dg Knee Complete 4 Views Left  Result Date: 09/22/2017 CLINICAL DATA:  ORIF tibial plateau. EXAM: DG C-ARM 61-120 MIN; LEFT KNEE - COMPLETE 4+ VIEW COMPARISON:  CT of the left knee 09/21/2017. FLUOROSCOPY TIME:  Fluoroscopy Time:  2 minutes 9 seconds. Number of Acquired Spot Images: 0 FINDINGS: Eight intraoperative fluoroscopic spot images are submitted. The lateral tibial plateau fracture is reduced. Lateral plate and screw fixation is applied. Gross anatomic reduction is achieved. There is no radiographic evidence for complication. IMPRESSION: ORIF of a left tibial plateau fracture with lateral plate and screw fixation. No radiographic evidence for complication. Electronically Signed   By: San Morelle M.D.   On:  09/22/2017 12:28   Dg Knee Complete 4 Views Left  Result Date: 09/20/2017 CLINICAL DATA:  72 year old female slipped and fell at restaurant with severe left knee pain. EXAM: LEFT KNEE - COMPLETE 4+ VIEW COMPARISON:  None. FINDINGS: No straight AP view of the left knee is provided. Comminuted nondisplaced fracture of the left fibula head is suspected. Comminuted fracture of the proximal left tibia is identified and is associated with depression of the left lateral tibial plateau. The medial plateau appears to remain intact. The patella appears intact. The distal left femur appears intact. Only a small joint effusion is evident. IMPRESSION: 1. Comminuted fracture of the proximal left tibia with depressed lateral tibial plateau. 2. Nondisplaced comminuted fracture of the left fibula head. Electronically Signed   By: Genevie Ann M.D.   On: 09/20/2017 22:17   Dg Knee Left Port  Result Date: 09/22/2017 CLINICAL DATA:   Fracture fixation EXAM: PORTABLE LEFT KNEE - 1-2 VIEW COMPARISON:  09/21/2017, 09/20/2017 FINDINGS: Depressed lateral tibial plateau fracture has been reduced. Fracture extends into the joint space which is now normal in height. Interval placement of lateral plate and screws in the proximal tibia. Gas and fluid in the suprapatellar bursa. Avulsion fracture of the medial femoral condyle unchanged. Nondisplaced fracture proximal fibular unchanged. IMPRESSION: Interval reduction and plate fixation of depressed lateral tibial plateau fracture now in good position Fracture of medial femoral condyle and proximal fibula unchanged. Electronically Signed   By: Franchot Gallo M.D.   On: 09/22/2017 13:34   Dg C-arm 1-60 Min  Result Date: 09/22/2017 CLINICAL DATA:  ORIF tibial plateau. EXAM: DG C-ARM 61-120 MIN; LEFT KNEE - COMPLETE 4+ VIEW COMPARISON:  CT of the left knee 09/21/2017. FLUOROSCOPY TIME:  Fluoroscopy Time:  2 minutes 9 seconds. Number of Acquired Spot Images: 0 FINDINGS: Eight intraoperative fluoroscopic spot images are submitted. The lateral tibial plateau fracture is reduced. Lateral plate and screw fixation is applied. Gross anatomic reduction is achieved. There is no radiographic evidence for complication. IMPRESSION: ORIF of a left tibial plateau fracture with lateral plate and screw fixation. No radiographic evidence for complication. Electronically Signed   By: San Morelle M.D.   On: 09/22/2017 12:28   Dg C-arm 1-60 Min  Result Date: 09/22/2017 CLINICAL DATA:  ORIF tibial plateau. EXAM: DG C-ARM 61-120 MIN; LEFT KNEE - COMPLETE 4+ VIEW COMPARISON:  CT of the left knee 09/21/2017. FLUOROSCOPY TIME:  Fluoroscopy Time:  2 minutes 9 seconds. Number of Acquired Spot Images: 0 FINDINGS: Eight intraoperative fluoroscopic spot images are submitted. The lateral tibial plateau fracture is reduced. Lateral plate and screw fixation is applied. Gross anatomic reduction is achieved. There is no  radiographic evidence for complication. IMPRESSION: ORIF of a left tibial plateau fracture with lateral plate and screw fixation. No radiographic evidence for complication. Electronically Signed   By: San Morelle M.D.   On: 09/22/2017 12:28     Scheduled Meds: . docusate sodium  100 mg Oral BID  . folic acid  1 mg Oral Daily  . losartan  50 mg Oral Daily  . metoprolol succinate  50 mg Oral Daily  . multivitamin with minerals  1 tablet Oral Daily  . pantoprazole  40 mg Oral Daily  . rOPINIRole  0.5 mg Oral QHS  . simvastatin  20 mg Oral QPM  . thiamine  100 mg Oral Daily   Or  . thiamine  100 mg Intravenous Daily   Continuous Infusions: . 0.9 % NaCl with KCl 20  mEq / L 75 mL/hr at 09/23/17 1241  . lactated ringers Stopped (09/23/17 0438)  . methocarbamol (ROBAXIN)  IV       LOS: 2 days    Time spent: 25 minutes  Greater than 50% of the time spent on counseling and coordinating the care.   Leisa Lenz, MD [facility] Pager [phone]  If 7PM-7AM, please contact night-coverage [email or web address] Password [email or web address] 09/23/2017, 5:31 PM

## 2017-09-23 NOTE — Evaluation (Signed)
Physical Therapy Evaluation Patient Details Name: [name] MRN: [id number] DOB: 03/06/1946 Today's Date: 09/23/2017   History of Present Illness  72 y.o. female admitted after slipping on water with L knee pain and resultant L leg tibial plateau fx, L MCL avulsion fx, and Left meniscal tear.  She underwent ORIF and repair on 09/22/17 and is NWB left leg in hinged knee brace post op.  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  Clinical Impression  Pt did well with scooting OOB to drop arm recliner chair with min assist and significant extra time due to nausea and pain.  Pt kept NWB left leg and gentle L knee exercises initiated.  She will need SNF level rehab at discharge.   PT to follow acutely for deficits listed below.     Follow Up Recommendations SNF    Equipment Recommendations  Wheelchair (measurements PT);Wheelchair cushion (measurements PT);Rolling walker with 5" wheels;3in1 (PT);Hospital bed    Recommendations for Other Services   NA    Precautions / Restrictions Precautions Precautions: Fall;Knee Required Braces or Orthoses: Other Brace/Splint Other Brace/Splint: hinged knee brace, per op note lock at night in extension and keep unlocked during the day, ROM in unlocked knee brace.  Restrictions Weight Bearing Restrictions: Yes LLE Weight Bearing: Non weight bearing      Mobility  Bed Mobility Overal bed mobility: Needs Assistance Bed Mobility: Supine to Sit     Supine to sit: Min assist;HOB elevated     General bed mobility comments: Min assist to help progress her left leg to EOB.  Started with warm up exercises prior to mobilizing.    Transfers Overall transfer level: Needs assistance Equipment used: None Transfers: Lateral/Scoot Transfers          Lateral/Scoot Transfers: Min assist;From elevated surface General transfer comment: Min assist to help support left leg and to ensure she was not scooting  too far forward.  Pt did well with step by step instructions on sequencing and slow, yet steady movement with rest breaks.   Ambulation/Gait             General Gait Details: unable at this time.          Balance Overall balance assessment: Needs assistance Sitting-balance support: Feet supported;Bilateral upper extremity supported Sitting balance-Leahy Scale: Fair Sitting balance - Comments: Using bil UE prop to unweight body in sitting due to left knee discomfort.                                      Pertinent Vitals/Pain Pain Assessment: Faces Faces Pain Scale: Hurts whole lot Pain Location: left leg.  Pain Descriptors / Indicators: Aching;Burning Pain Intervention(s): Limited activity within patient's tolerance;Monitored during session;Repositioned    Home Living Family/patient expects to be discharged to:: Skilled nursing facility                 Additional Comments: Pt and husband are aware she will need SNF level rehab at discharge.     Prior Function Level of Independence: Independent         Comments: very active in volunteering with the [facility] and travels quite a bit.  Still drives, is retired.     Hand Dominance   Dominant Hand: Right    Extremity/Trunk Assessment   Upper Extremity Assessment Upper Extremity Assessment: Defer to  OT evaluation    Lower Extremity Assessment Lower Extremity Assessment: LLE deficits/detail LLE Deficits / Details: left leg with normal post op pain and weakness.  Ankle 3/5, knee 2-/5, hip 2/5    Cervical / Trunk Assessment Cervical / Trunk Assessment: Other exceptions Cervical / Trunk Exceptions: pt with self reported h/o chronic low back pain.   Communication   Communication: No difficulties  Cognition Arousal/Alertness: Awake/alert Behavior During Therapy: Anxious Overall Cognitive Status: Within Functional Limits for tasks assessed                                  General Comments: Pt is very worried about how bad it will hurt to move.          Exercises Total Joint Exercises Ankle Circles/Pumps: AROM;Both;20 reps Quad Sets: AROM;Left;10 reps Heel Slides: AAROM;Left;10 reps   Assessment/Plan    PT Assessment Patient needs continued PT services  PT Problem List Decreased strength;Decreased range of motion;Decreased activity tolerance;Decreased balance;Decreased mobility;Decreased knowledge of use of DME;Decreased knowledge of precautions;Pain       PT Treatment Interventions DME instruction;Gait training;Functional mobility training;Therapeutic activities;Therapeutic exercise;Balance training;Patient/family education;Manual techniques;Modalities    PT Goals (Current goals can be found in the Care Plan section)  Acute Rehab PT Goals Patient Stated Goal: to get better so she can get back to traveling with her husband PT Goal Formulation: With patient Time For Goal Achievement: 09/30/17 Potential to Achieve Goals: Good    Frequency Min 3X/week           AM-PAC PT "6 Clicks" Daily Activity  Outcome Measure Difficulty turning over in bed (including adjusting bedclothes, sheets and blankets)?: Unable Difficulty moving from lying on back to sitting on the side of the bed? : Unable Difficulty sitting down on and standing up from a chair with arms (e.g., wheelchair, bedside commode, etc,.)?: Unable Help needed moving to and from a bed to chair (including a wheelchair)?: A Little Help needed walking in hospital room?: Total Help needed climbing 3-5 steps with a railing? : Total 6 Click Score: 8    End of Session Equipment Utilized During Treatment: Left knee immobilizer;Other (comment)(left knee hinged brace) Activity Tolerance: Patient limited by pain Patient left: in chair;with call bell/phone within reach;with family/visitor present Nurse Communication: Mobility status;Other (comment);Weight bearing status;Precautions(to RN tech) PT  Visit Diagnosis: Muscle weakness (generalized) (M62.81);Difficulty in walking, not elsewhere classified (R26.2);Pain Pain - Right/Left: Left Pain - part of body: Leg    Time: 9470-9628 PT Time Calculation (min) (ACUTE ONLY): 45 min   Charges:           Wells Guiles B. Saw [place], PT, DPT [phone]    PT Evaluation $PT Eval Moderate Complexity: 1 Mod PT Treatments $Therapeutic Exercise: 8-22 mins $Therapeutic Activity: 8-22 mins   09/23/2017, 2:29 PM

## 2017-09-23 NOTE — Progress Notes (Signed)
Patient assisted to [facility] to help with voiding. Patient unable to void. Assisted patient back to bed and scan bladder, patient has 520 in bladder. Patient started coughing and stated she felt the urge to void. She voided in bedpan 200 ml with some urine on the pad. Will continue to monitor.

## 2017-09-24 ENCOUNTER — Inpatient Hospital Stay (HOSPITAL_COMMUNITY): Payer: Medicare Other

## 2017-09-24 LAB — BASIC METABOLIC PANEL
Anion gap: 8 (ref 5–15)
BUN: 8 mg/dL (ref 6–20)
CALCIUM: 8.2 mg/dL — AB (ref 8.9–10.3)
CO2: 23 mmol/L (ref 22–32)
CREATININE: 0.88 mg/dL (ref 0.44–1.00)
Chloride: 99 mmol/L — ABNORMAL LOW (ref 101–111)
Glucose, Bld: 110 mg/dL — ABNORMAL HIGH (ref 65–99)
Potassium: 4.2 mmol/L (ref 3.5–5.1)
SODIUM: 130 mmol/L — AB (ref 135–145)

## 2017-09-24 LAB — CBC
HCT: 26.9 % — ABNORMAL LOW (ref 36.0–46.0)
HEMOGLOBIN: 9 g/dL — AB (ref 12.0–15.0)
MCH: 32.1 pg (ref 26.0–34.0)
MCHC: 33.5 g/dL (ref 30.0–36.0)
MCV: 96.1 fL (ref 78.0–100.0)
PLATELETS: 157 10*3/uL (ref 150–400)
RBC: 2.8 MIL/uL — ABNORMAL LOW (ref 3.87–5.11)
RDW: 12.8 % (ref 11.5–15.5)
WBC: 9.1 10*3/uL (ref 4.0–10.5)

## 2017-09-24 LAB — GLUCOSE, CAPILLARY
GLUCOSE-CAPILLARY: 95 mg/dL (ref 65–99)
Glucose-Capillary: 121 mg/dL — ABNORMAL HIGH (ref 65–99)

## 2017-09-24 NOTE — Progress Notes (Signed)
PRN hydralazine given per order. Will continue to monitor.

## 2017-09-24 NOTE — Progress Notes (Signed)
Notified social work that patient need work up for SNF for discharge in am. Patient spouse mention [facility].

## 2017-09-24 NOTE — Evaluation (Signed)
Occupational Therapy Evaluation Patient Details Name: [name] MRN: [id number] DOB: 04-Aug-1946 Today's Date: 09/24/2017    History of Present Illness 72 y.o. female admitted after slipping on water with L knee pain and resultant L leg tibial plateau fx, L MCL avulsion fx, and Left meniscal tear.  She underwent ORIF and repair on 09/22/17 and is NWB left leg in hinged knee brace post op.  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   Clinical Impression   PTA, pt was living with her husband and was independent and very active. Pt currently requiring Max A +2 for LB ADLs, toileting, and functional transfers with RW. Pt maintaining NWB status in standing with VCs and required physical A during sit<>stand for LLE management due to poor strength. Pt will require further acute OT to facilitate safe dc. Recommend dc to SNF for further acute OT to increase safety and independence with ADLs and functional mobility.     Follow Up Recommendations  SNF;Supervision/Assistance - 24 hour    Equipment Recommendations  Other (comment)(Defer to next venue)    Recommendations for Other Services PT consult     Precautions / Restrictions Precautions Precautions: Fall;Knee Required Braces or Orthoses: Other Brace/Splint Other Brace/Splint: hinged knee brace, per op note lock at night in extension and keep unlocked during the day, ROM in unlocked knee brace.  Restrictions Weight Bearing Restrictions: Yes LLE Weight Bearing: Non weight bearing      Mobility Bed Mobility Overal bed mobility: Needs Assistance Bed Mobility: Sit to Supine       Sit to supine: Mod assist   General bed mobility comments: Mod A to manage LLE over EOB and to decreased pain  Transfers Overall transfer level: Needs assistance Equipment used: Rolling walker (2 wheeled) Transfers: Sit to/from [facility] Sit to Stand: Mod assist;+2 physical  assistance Stand pivot transfers: Max assist;+2 physical assistance;+2 safety/equipment       General transfer comment: Mod A +2 to power into standing. Second person also maintaining NWB status and managing LLE during descent. Max A for balance in stand pivot with RW. Pt able to maintain NWB but fatigued quickly    Balance Overall balance assessment: Needs assistance Sitting-balance support: Feet supported;Bilateral upper extremity supported Sitting balance-Leahy Scale: Fair Sitting balance - Comments: Using bil UE prop to unweight body in sitting due to left knee discomfort.    Standing balance support: Bilateral upper extremity supported;During functional activity Standing balance-Leahy Scale: Poor Standing balance comment: Reliant on UE support                           ADL either performed or assessed with clinical judgement   ADL Overall ADL's : Needs assistance/impaired Eating/Feeding: Set up;Sitting   Grooming: Set up;Sitting   Upper Body Bathing: Set up;Supervision/ safety;Sitting   Lower Body Bathing: Maximal assistance;+2 for physical assistance;Sit to/from stand;Bed level   Upper Body Dressing : Set up;Supervision/safety;Sitting   Lower Body Dressing: Maximal assistance;+2 for physical assistance;Sit to/from stand;Bed level Lower Body Dressing Details (indicate cue type and reason): Max A +2 for sit<>Stand. Max A to don socks Toilet Transfer: Maximal assistance;+2 for physical assistance;RW;Stand-pivot(Cues to WB status) Toilet Transfer Details (indicate cue type and reason): Pt performing stand pivot from reclienr to bed with Max A +2 and cues to maitnain NWB status. Pt fatigues quickly but motivated to participate  Functional mobility during ADLs: Maximal assistance;+2 for physical assistance;Rolling walker General ADL Comments: Pt with decreased functional performance. Max A for LB ADLs, toileting, and functional transfers.     Vision          Perception     Praxis      Pertinent Vitals/Pain Pain Assessment: Faces Faces Pain Scale: Hurts whole lot Pain Location: left leg.  Pain Descriptors / Indicators: Aching;Burning Pain Intervention(s): Limited activity within patient's tolerance;Monitored during session;Repositioned     Hand Dominance Right   Extremity/Trunk Assessment Upper Extremity Assessment Upper Extremity Assessment: Overall WFL for tasks assessed   Lower Extremity Assessment Lower Extremity Assessment: Defer to PT evaluation;LLE deficits/detail LLE Deficits / Details: left leg with normal post op pain and weakness.  Ankle 3/5, knee 2-/5, hip 2/5   Cervical / Trunk Assessment Cervical / Trunk Assessment: Other exceptions Cervical / Trunk Exceptions: pt with self reported h/o chronic low back pain.    Communication Communication Communication: No difficulties   Cognition Arousal/Alertness: Awake/alert Behavior During Therapy: Anxious Overall Cognitive Status: Within Functional Limits for tasks assessed                                 General Comments: Worried about falling. Follow commands and motivated to particiapte in therapy   General Comments  NT present for +2 assistance    Exercises     Shoulder Instructions      Home Living Family/patient expects to be discharged to:: Skilled nursing facility Living Arrangements: Spouse/significant other                               Additional Comments: Discussed rehab needs and SNF       Prior Functioning/Environment Level of Independence: Independent        Comments: ADLs, IADLs, driving, and very active in volunteering with the [facility] and travels quite a bit. Retired.        OT Problem List: Decreased strength;Decreased range of motion;Decreased activity tolerance;Impaired balance (sitting and/or standing);Decreased knowledge of use of DME or AE;Decreased knowledge of precautions;Pain      OT  Treatment/Interventions: Self-care/ADL training;Therapeutic exercise;Energy conservation;DME and/or AE instruction;Therapeutic activities;Patient/family education    OT Goals(Current goals can be found in the care plan section) Acute Rehab OT Goals Patient Stated Goal: to get better so she can get back to traveling with her husband OT Goal Formulation: With patient Time For Goal Achievement: 10/08/17 Potential to Achieve Goals: Good ADL Goals Pt Will Perform Lower Body Dressing: with min assist;sit to/from stand;with adaptive equipment Pt Will Transfer to Toilet: with min assist;stand pivot transfer;bedside commode Pt Will Perform Toileting - Clothing Manipulation and hygiene: with min assist;sit to/from stand Additional ADL Goal #1: Pt will maintain NWB status with 1-2 VCs during ADLs  OT Frequency: Min 2X/week   Barriers to D/C:            Co-evaluation              AM-PAC PT "6 Clicks" Daily Activity     Outcome Measure Help from another person eating meals?: None Help from another person taking care of personal grooming?: None Help from another person toileting, which includes using toliet, bedpan, or urinal?: A Lot Help from another person bathing (including washing, rinsing, drying)?: A Lot Help from another person to put on and taking off regular upper  body clothing?: A Little Help from another person to put on and taking off regular lower body clothing?: A Lot 6 Click Score: 17   End of Session Equipment Utilized During Treatment: Gait belt;Rolling walker Nurse Communication: Mobility status;Precautions;Weight bearing status  Activity Tolerance: Patient tolerated treatment well;Patient limited by fatigue Patient left: in bed;with call bell/phone within reach  OT Visit Diagnosis: Unsteadiness on feet (R26.81);Other abnormalities of gait and mobility (R26.89);Muscle weakness (generalized) (M62.81);History of falling (Z91.81);Pain Pain - Right/Left: Left Pain - part  of body: Leg                Time: 5301-0404 OT Time Calculation (min): 22 min Charges:  OT General Charges $OT Visit: 1 Visit OT Evaluation $OT Eval Moderate Complexity: 1 Mod G-Codes:     Camilia Caywood MSOT, OTR/L Acute Rehab Pager: [phone] Office: [place] 09/24/2017, 4:04 PM

## 2017-09-24 NOTE — Progress Notes (Signed)
Patient ID: [name], female   DOB: 1945-12-21, 72 y.o.   MRN: [id number]  PROGRESS NOTE    [name]  YQM:[id number] DOB: [birth year] DOA: 09/20/2017  PCP: [name], MD   Brief Narrative:  72 y.o.femalewith medical history significant of LUNG cancer sp resectionspchemotherapy complicated by right pleural effusion, COPD, history of syncopal spells, presents with a fall. Patient found to have a sodium of 128. X-ray showed commuted proximal tibial fracture. Underwent ORIF 09/22/2017.  Assessment & Plan:   Syncope - Vasovagal, concern however alcohol dependence and electrolyte abnormalities - Per PT - SNF recommended - Appreciate SW assistance with discharge planning   Hyperlipidemia Continue Zocor 20 mg at bedtime   Essential hypertension - Continue Losartan and metoprolol   Tibial plateau fracture - S/P ORIF left bicondylar tibial plateau fracture, repair of left lateral meniscus tear and closed treatment of left medial femoral epicondyle avulsion fracture   CKD, stage 2 - Cr is WNL this am  Anemia of CKD - Hgb stable    Hypokalemia / Hypomagnesemia  - Supplemented and WNL  Hyponatremia - Long standing - Worsening on admission due to dehydration - Sodium stable after IV fluids   Lung cancer / Acute respiratory failure with hypoxia  - Right upper lung lobe - Management per oncology  - May need oxygen on discharge  - Obtain CXR this am   DVT prophylaxis: SCD's Code Status: full code  Family Communication: no family at bedside  Disposition Plan: SNF once bed available    Consultants:   Ortho  PT   Procedures:   ORIF left bicondylar tibial plateau fracture, repair of left lateral meniscus tear and closed treatment of left medial femoral epicondyle avulsion fracture 09/22/2016  Antimicrobials:   Ancef pre op    Subjective: No overnight events.   Objective: Vitals:   09/24/17 0444 09/24/17 0649 09/24/17 0746 09/24/17 0830    BP:  136/68 (!) 141/102 (!) 164/74  Pulse:  84 67 92  Resp:   12   Temp:   98.4 F (36.9 C)   TempSrc:   Oral   SpO2:   95% (!) 78%  Weight: 79.8 kg (176 lb)     Height:        Intake/Output Summary (Last 24 hours) at 09/24/2017 0924 Last data filed at 09/24/2017 0823 Gross per 24 hour  Intake 3090 ml  Output 405 ml  Net 2685 ml   Filed Weights   09/22/17 0520 09/23/17 0330 09/24/17 0444  Weight: 76.2 kg (167 lb 15.9 oz) 77.1 kg (170 lb) 79.8 kg (176 lb)    Physical Exam  Constitutional: Appears well-developed and well-nourished. No distress.   CVS: RRR, S1/S2 + Pulmonary: Diminished breath sounds, no wheezing  Abdominal: Soft. BS +,  no distension, tenderness, rebound or guarding.  Musculoskeletal: No edema, (+) pain in left leg Lymphadenopathy: No lymphadenopathy noted, cervical, inguinal. Neuro: Alert. Normal reflexes, muscle tone coordination. No cranial nerve deficit. Skin: Skin is warm and dry.  Psychiatric: Normal mood and affect. Behavior, judgment, thought content normal.    Data Reviewed: I have personally reviewed following labs and imaging studies  CBC: Recent Labs  Lab 09/20/17 2119 09/21/17 0337 09/22/17 0517 09/23/17 0349 09/24/17 0612  WBC 12.5* 10.4 7.0 10.4 9.1  NEUTROABS  --  8.7*  --   --   --   HGB 13.7 11.8* 11.2* 10.0* 9.0*  HCT 38.1 34.2* 33.6* 29.4* 26.9*  MCV 94.3 95.5 96.3 95.5 96.1  PLT 234 209 170 153 865   Basic Metabolic Panel: Recent Labs  Lab 09/20/17 2119 09/21/17 0337 09/21/17 1414 09/22/17 0517 09/23/17 0349 09/24/17 0612  NA 128* 129*  --  130* 131* 130*  K 3.4* 3.8  --  3.8 4.3 4.2  CL 93* 94*  --  98* 97* 99*  CO2 19* 23  --  22 23 23   GLUCOSE 130* 148*  --  122* 149* 110*  BUN 15 12  --  7 8 8   CREATININE 1.25* 1.00  --  0.97 0.99 0.88  CALCIUM 9.5 8.7*  --  8.7* 8.6* 8.2*  MG  --   --  1.6* 2.0  --   --    GFR: Estimated Creatinine Clearance: 60.3 mL/min (by C-G formula based on SCr of 0.88 mg/dL). Liver  Function Tests: Recent Labs  Lab 09/21/17 0337 09/22/17 0517  AST 28 22  ALT 21 15  ALKPHOS 97 87  BILITOT 0.7 0.6  PROT 6.3* 5.9*  ALBUMIN 4.1 3.6   No results for input(s): LIPASE, AMYLASE in the last 168 hours. No results for input(s): AMMONIA in the last 168 hours. Coagulation Profile: No results for input(s): INR, PROTIME in the last 168 hours. Cardiac Enzymes: Recent Labs  Lab 09/21/17 0210 09/21/17 0337 09/21/17 1123  TROPONINI <0.03 <0.03 <0.03   BNP (last 3 results) No results for input(s): PROBNP in the last 8760 hours. HbA1C: No results for input(s): HGBA1C in the last 72 hours. CBG: Recent Labs  Lab 09/21/17 0533 09/22/17 0814 09/23/17 0800 09/24/17 0744  GLUCAP 163* 98 120* 95   Lipid Profile: No results for input(s): CHOL, HDL, LDLCALC, TRIG, CHOLHDL, LDLDIRECT in the last 72 hours. Thyroid Function Tests: Recent Labs    09/21/17 1414  TSH 2.158  FREET4 1.24*   Anemia Panel: No results for input(s): VITAMINB12, FOLATE, FERRITIN, TIBC, IRON, RETICCTPCT in the last 72 hours. Urine analysis:    Component Value Date/Time   COLORURINE STRAW (A) 09/21/2017 0210   APPEARANCEUR CLEAR 09/21/2017 0210   LABSPEC 1.008 09/21/2017 0210   PHURINE 5.0 09/21/2017 0210   GLUCOSEU NEGATIVE 09/21/2017 0210   HGBUR SMALL (A) 09/21/2017 0210   BILIRUBINUR NEGATIVE 09/21/2017 0210   KETONESUR NEGATIVE 09/21/2017 0210   PROTEINUR NEGATIVE 09/21/2017 0210   UROBILINOGEN 0.2 07/03/2013 0839   NITRITE NEGATIVE 09/21/2017 0210   LEUKOCYTESUR NEGATIVE 09/21/2017 0210   Sepsis Labs: @LABRCNTIP (procalcitonin:4,lacticidven:4)    Surgical PCR screen     Status: None   Collection Time: 09/21/17  2:45 PM  Result Value Ref Range Status   MRSA, PCR NEGATIVE NEGATIVE Final   Staphylococcus aureus NEGATIVE NEGATIVE Final      Radiology Studies: Dg Chest 1 View  Result Date: 09/20/2017 CLINICAL DATA:  Slipped and fell on water at restaurant. EXAM: CHEST 1 VIEW  COMPARISON:  Chest radiograph June 01, 2017 FINDINGS: Cardiac silhouette is mildly enlarged and unchanged. Mildly calcified aortic knob. Chronic RIGHT lung base pleural thickening and scarring. No focal consolidation. No pneumothorax. Included osseous structures are non suspicious. IMPRESSION: Stable cardiomegaly and RIGHT lung base pleural thickening/scarring. Electronically Signed   By: Elon Alas M.D.   On: 09/20/2017 22:19   Dg Thoracic Spine 2 View  Result Date: 09/20/2017 CLINICAL DATA:  72 year old female status post slip and fall at rest trauma. Pain. Personal history of lung cancer status post right upper lobectomy. EXAM: THORACIC SPINE 2 VIEWS COMPARISON:  Chest CT 07/06/2017 FINDINGS: Thoracic segmentation appears normal. There is levoconvex  scoliosis of the lower thoracic spine in conjunction with lumbar scoliosis, resulting in some rotation of the lower thoracic vertebrae. Superimposed flowing lower thoracic endplate osteophytes. T9 butterfly vertebra, more apparent on the comparison CT. Cervicothoracic junction alignment is within normal limits. Stable thoracic vertebral height and alignment. Posterior ribs appear intact. Visualized thoracic visceral contours are stable. IMPRESSION: No acute osseous abnormality identified in the thoracic spine. Electronically Signed   By: Genevie Ann M.D.   On: 09/20/2017 22:20   Dg Lumbar Spine Complete  Result Date: 09/20/2017 CLINICAL DATA:  72 year old female status post slip and fall at restaurant. Pain. EXAM: LUMBAR SPINE - COMPLETE 4+ VIEW COMPARISON:  Lumbar radiographs 09/20/2015. FINDINGS: Chronic moderate to severe lumbar scoliosis with a rotatory component. Stable lumbar vertebral height and alignment since 2017. Associated chronic severe lumbar disc space loss and bulky endplate osteophytosis. Stable visible sacrum and SI joints. Visible lower thoracic levels appear stable. No acute osseous abnormality identified. Negative visible bowel gas  pattern. Calcified aortic atherosclerosis. IMPRESSION: 1.  No acute osseous abnormality identified in the lumbar spine. 2. Chronic severe lumbar scoliosis, disc, and endplate degeneration. Electronically Signed   By: Genevie Ann M.D.   On: 09/20/2017 22:14   Ct Knee Left Wo Contrast  Result Date: 09/21/2017 CLINICAL DATA:  Fracture of the knee after fall. EXAM: CT OF THE  KNEE WITHOUT CONTRAST TECHNIQUE: Multidetector CT imaging of the knee was performed according to the standard protocol. Multiplanar CT image reconstructions were also generated. COMPARISON:  None. FINDINGS: Bones/Joint/Cartilage There is an acute, closed, bicondylar fracture of the tibial plateau with up to 19 mm of central depression involving the lateral tibial plateau and with comminution. A sagittal oblique fracture without depression involving the medial tibial plateau is also noted exiting through the medial tibial metadiaphysis, series 6, image 53. Fracture also appears to undermining the tibial spine. There is an associated moderate suprapatellar lipohemarthrosis. A comminuted fibular head and neck fracture is noted without displacement. A fracture along the lateral cortex of the medial femoral condyle is also present. Ligaments Suboptimally assessed by CT. Given fracture of the medial femoral condyle along the expected course of the MCL, injury to the MCL is not excluded. Muscles and Tendons No intramuscular hemorrhage or atrophy. Soft tissues Posttraumatic subcutaneous soft tissue induration about the knee. IMPRESSION: 1. Acute bicondylar fracture of the tibial plateau with central depression of the lateral tibial plateau to 19 mm with comminution. Sagittal oblique fracture through the medial tibial plateau is noted. Fracture appears to also undermines the tibial spine. Findings would be in keeping with a Schatzker type IV fracture. 2. Nondisplaced fracture of the medial femoral condyle along its outer cortex. Suspect medial collateral  ligament injury given location of this fracture. 3. Comminuted fibular head and neck fracture without displacement. 4. Lipohemarthrosis. Electronically Signed   By: Ashley Royalty M.D.   On: 09/21/2017 01:07   Portable Chest 1 View  Result Date: 09/21/2017 CLINICAL DATA:  Preoperative evaluation.  History of lung cancer. EXAM: PORTABLE CHEST 1 VIEW COMPARISON:  Chest radiograph September 20, 2017 FINDINGS: Cardiac silhouette is mildly enlarged. Mildly calcified aortic arch. Similar hyperinflation with RIGHT lung base pleural thickening and scarring. RIGHT apical opacification and volume loss. Trachea projects midline and there is no pneumothorax. Soft tissue planes and included osseous structures are non-suspicious. IMPRESSION: Stable cardiomegaly and RIGHT lung base pleural thickening/scarring. RIGHT apical scarring, less likely consolidation. Aortic Atherosclerosis (ICD10-I70.0). Electronically Signed   By: Thana Farr.D.  On: 09/21/2017 02:59   Dg Knee Complete 4 Views Left  Result Date: 09/22/2017 CLINICAL DATA:  ORIF tibial plateau. EXAM: DG C-ARM 61-120 MIN; LEFT KNEE - COMPLETE 4+ VIEW COMPARISON:  CT of the left knee 09/21/2017. FLUOROSCOPY TIME:  Fluoroscopy Time:  2 minutes 9 seconds. Number of Acquired Spot Images: 0 FINDINGS: Eight intraoperative fluoroscopic spot images are submitted. The lateral tibial plateau fracture is reduced. Lateral plate and screw fixation is applied. Gross anatomic reduction is achieved. There is no radiographic evidence for complication. IMPRESSION: ORIF of a left tibial plateau fracture with lateral plate and screw fixation. No radiographic evidence for complication. Electronically Signed   By: San Morelle M.D.   On: 09/22/2017 12:28   Dg Knee Complete 4 Views Left  Result Date: 09/20/2017 CLINICAL DATA:  72 year old female slipped and fell at restaurant with severe left knee pain. EXAM: LEFT KNEE - COMPLETE 4+ VIEW COMPARISON:  None. FINDINGS: No  straight AP view of the left knee is provided. Comminuted nondisplaced fracture of the left fibula head is suspected. Comminuted fracture of the proximal left tibia is identified and is associated with depression of the left lateral tibial plateau. The medial plateau appears to remain intact. The patella appears intact. The distal left femur appears intact. Only a small joint effusion is evident. IMPRESSION: 1. Comminuted fracture of the proximal left tibia with depressed lateral tibial plateau. 2. Nondisplaced comminuted fracture of the left fibula head. Electronically Signed   By: Genevie Ann M.D.   On: 09/20/2017 22:17   Dg Knee Left Port  Result Date: 09/22/2017 CLINICAL DATA:  Fracture fixation EXAM: PORTABLE LEFT KNEE - 1-2 VIEW COMPARISON:  09/21/2017, 09/20/2017 FINDINGS: Depressed lateral tibial plateau fracture has been reduced. Fracture extends into the joint space which is now normal in height. Interval placement of lateral plate and screws in the proximal tibia. Gas and fluid in the suprapatellar bursa. Avulsion fracture of the medial femoral condyle unchanged. Nondisplaced fracture proximal fibular unchanged. IMPRESSION: Interval reduction and plate fixation of depressed lateral tibial plateau fracture now in good position Fracture of medial femoral condyle and proximal fibula unchanged. Electronically Signed   By: Franchot Gallo M.D.   On: 09/22/2017 13:34   Dg C-arm 1-60 Min  Result Date: 09/22/2017 CLINICAL DATA:  ORIF tibial plateau. EXAM: DG C-ARM 61-120 MIN; LEFT KNEE - COMPLETE 4+ VIEW COMPARISON:  CT of the left knee 09/21/2017. FLUOROSCOPY TIME:  Fluoroscopy Time:  2 minutes 9 seconds. Number of Acquired Spot Images: 0 FINDINGS: Eight intraoperative fluoroscopic spot images are submitted. The lateral tibial plateau fracture is reduced. Lateral plate and screw fixation is applied. Gross anatomic reduction is achieved. There is no radiographic evidence for complication. IMPRESSION: ORIF of a  left tibial plateau fracture with lateral plate and screw fixation. No radiographic evidence for complication. Electronically Signed   By: San Morelle M.D.   On: 09/22/2017 12:28   Dg C-arm 1-60 Min  Result Date: 09/22/2017 CLINICAL DATA:  ORIF tibial plateau. EXAM: DG C-ARM 61-120 MIN; LEFT KNEE - COMPLETE 4+ VIEW COMPARISON:  CT of the left knee 09/21/2017. FLUOROSCOPY TIME:  Fluoroscopy Time:  2 minutes 9 seconds. Number of Acquired Spot Images: 0 FINDINGS: Eight intraoperative fluoroscopic spot images are submitted. The lateral tibial plateau fracture is reduced. Lateral plate and screw fixation is applied. Gross anatomic reduction is achieved. There is no radiographic evidence for complication. IMPRESSION: ORIF of a left tibial plateau fracture with lateral plate and screw fixation. No radiographic  evidence for complication. Electronically Signed   By: San Morelle M.D.   On: 09/22/2017 12:28     Scheduled Meds: . docusate sodium  100 mg Oral BID  . folic acid  1 mg Oral Daily  . losartan  50 mg Oral Daily  . metoprolol succinate  50 mg Oral Daily  . multivitamin with minerals  1 tablet Oral Daily  . pantoprazole  40 mg Oral Daily  . rOPINIRole  0.5 mg Oral QHS  . simvastatin  20 mg Oral QPM  . thiamine  100 mg Oral Daily   Or  . thiamine  100 mg Intravenous Daily   Continuous Infusions: . 0.9 % NaCl with KCl 20 mEq / L 75 mL/hr at 09/23/17 1241  . lactated ringers Stopped (09/23/17 0438)  . methocarbamol (ROBAXIN)  IV       LOS: 3 days    Time spent: 25 minutes  Greater than 50% of the time spent on counseling and coordinating the care.   Leisa Lenz, MD [facility] Pager [phone]  If 7PM-7AM, please contact night-coverage [email or web address] Password [facility] 09/24/2017, 9:24 AM

## 2017-09-24 NOTE — Progress Notes (Signed)
Patient ID: [name], female   DOB: 1946/05/03, 73 y.o.   MRN: [id number]     Subjective:  Patient reports pain as mild to moderate.  Patient in bed and in no acute distress.    Objective:   VITALS:   Vitals:   09/24/17 0649 09/24/17 0746 09/24/17 0830 09/24/17 1548  BP: 136/68 (!) 141/102 (!) 164/74 (!) 177/64  Pulse: 84 67 92 83  Resp:  12  16  Temp:  98.4 F (36.9 C)  98.4 F (36.9 C)  TempSrc:  Oral  Oral  SpO2:  95% (!) 78% 98%  Weight:      Height:        ABD soft Sensation intact distally Dorsiflexion/Plantar flexion intact Incision: dressing C/D/I and no drainage   Lab Results  Component Value Date   WBC 9.1 09/24/2017   HGB 9.0 (L) 09/24/2017   HCT 26.9 (L) 09/24/2017   MCV 96.1 09/24/2017   PLT 157 09/24/2017   BMET    Component Value Date/Time   NA 130 (L) 09/24/2017 0612   NA 130 (L) 07/06/2017 0832   K 4.2 09/24/2017 0612   K 4.3 07/06/2017 0832   CL 99 (L) 09/24/2017 0612   CO2 23 09/24/2017 0612   CO2 25 07/06/2017 0832   GLUCOSE 110 (H) 09/24/2017 0612   GLUCOSE 96 07/06/2017 0832   BUN 8 09/24/2017 0612   BUN 14.8 07/06/2017 0832   CREATININE 0.88 09/24/2017 0612   CREATININE 1.2 (H) 07/06/2017 0832   CALCIUM 8.2 (L) 09/24/2017 0612   CALCIUM 9.5 07/06/2017 0832   GFRNONAA >60 09/24/2017 0612   GFRAA >60 09/24/2017 0612     Assessment/Plan: 2 Days Post-Op   Active Problems:   Hyperlipidemia   Lung cancer, Right upper lobe   Syncope   Hypokalemia   Hyponatremia   Tibial plateau fracture   Postural dizziness with presyncope   Advance diet Up with therapy Continue plan per Dr Doreatha Martin  Continue plan per medicine NWB left lower ext Dry dressing PRN   [name] 09/24/2017, 4:42 PM   Marchia Bond, MD Cell [phone]

## 2017-09-25 ENCOUNTER — Encounter (HOSPITAL_COMMUNITY): Payer: Self-pay | Admitting: Student

## 2017-09-25 LAB — CBC
HEMATOCRIT: 24.8 % — AB (ref 36.0–46.0)
Hemoglobin: 8.5 g/dL — ABNORMAL LOW (ref 12.0–15.0)
MCH: 32.9 pg (ref 26.0–34.0)
MCHC: 34.3 g/dL (ref 30.0–36.0)
MCV: 96.1 fL (ref 78.0–100.0)
Platelets: 184 10*3/uL (ref 150–400)
RBC: 2.58 MIL/uL — ABNORMAL LOW (ref 3.87–5.11)
RDW: 12.7 % (ref 11.5–15.5)
WBC: 7.4 10*3/uL (ref 4.0–10.5)

## 2017-09-25 LAB — BASIC METABOLIC PANEL
Anion gap: 10 (ref 5–15)
BUN: 10 mg/dL (ref 6–20)
CALCIUM: 8.3 mg/dL — AB (ref 8.9–10.3)
CO2: 22 mmol/L (ref 22–32)
Chloride: 97 mmol/L — ABNORMAL LOW (ref 101–111)
Creatinine, Ser: 0.94 mg/dL (ref 0.44–1.00)
GFR calc Af Amer: >60 mL/min (ref >60)
GFR calc non Af Amer: 59 mL/min — ABNORMAL LOW (ref >60)
GLUCOSE: 98 mg/dL (ref 65–99)
Potassium: 3.7 mmol/L (ref 3.5–5.1)
Sodium: 129 mmol/L — ABNORMAL LOW (ref 135–145)

## 2017-09-25 LAB — GLUCOSE, CAPILLARY: Glucose-Capillary: 92 mg/dL (ref 65–99)

## 2017-09-25 NOTE — Progress Notes (Signed)
Patient ID: [name], female   DOB: 12-01-1945, 72 y.o.   MRN: [id number]  PROGRESS NOTE    [name]  BHA:[id number] DOB: 05/27/1946 DOA: 09/20/2017  PCP: [name], MD   Brief Narrative:  72 y.o.femalewith medical history significant of LUNG cancer sp resectionspchemotherapy complicated by right pleural effusion, COPD, history of syncopal spells, presents with a fall. Patient found to have a sodium of 128. X-ray showed commuted proximal tibial fracture. Underwent ORIF 09/22/2017.  Assessment & Plan:   Syncope - Vasovagal, concern however alcohol dependence and electrolyte abnormalities - Per PT - SNF recommended - To SNF likely in next 24 hours   Hyperlipidemia - Continue Zocor   Essential hypertension - Continue Losartan and metoprolol   Tibial plateau fracture - S/P ORIF left bicondylar tibial plateau fracture, repair of left lateral meniscus tear and closed treatment of left medial femoral epicondyle avulsion fracture  - TO SNF on discharge   CKD, stage 2 - Cr WNL  Anemia of CKD - Hgb stable    Hypokalemia / Hypomagnesemia  - Supplemented   Hyponatremia - Long standing - Worsening on admission due to dehydration - Sodium 129 this am - Follow up BMP in am  Lung cancer / Acute respiratory failure with hypoxia  - Right upper lung lobe - Management per oncology  - CXR 2/17 showed new left basilar atelectasis.  No active disease. Stable postsurgical changes and scarring in the right lung   DVT prophylaxis: SCD's Code Status: full code  Family Communication: no family at bedside  Disposition Plan: SNF in am if bed available    Consultants:   Ortho  PT   Procedures:   ORIF left bicondylar tibial plateau fracture, repair of left lateral meniscus tear and closed treatment of left medial femoral epicondyle avulsion fracture 09/22/2016  Antimicrobials:   Ancef pre op    Subjective: No overnight events.  Objective: Vitals:   09/25/17 0621 09/25/17 0755 09/25/17 0926 09/25/17 1156  BP: (!) 168/75 (!) 180/67 (!) 159/58 (!) 170/62  Pulse: 86 81 96 88  Resp:  17  17  Temp: 98.8 F (37.1 C) 98.5 F (36.9 C)  99 F (37.2 C)  TempSrc: Oral Oral  Oral  SpO2: 97% 96%  95%  Weight:      Height:        Intake/Output Summary (Last 24 hours) at 09/25/2017 1213 Last data filed at 09/25/2017 0930 Gross per 24 hour  Intake 120 ml  Output 940 ml  Net -820 ml   Filed Weights   09/23/17 0330 09/24/17 0444 09/25/17 0612  Weight: 77.1 kg (170 lb) 79.8 kg (176 lb) 77.1 kg (170 lb)    Physical Exam  Constitutional: Appears well-developed and well-nourished. No distress.  CVS: RRR, S1/S2 + Pulmonary: Effort and breath sounds normal, no stridor, rhonchi, wheezes, rales.  Abdominal: Soft. BS +,  no distension, tenderness, rebound or guarding.  Musculoskeletal: pain in left leg Lymphadenopathy: No lymphadenopathy noted, cervical, inguinal. Neuro: Alert. Normal reflexes, muscle tone coordination.  Skin: Skin is warm and dry. No rash noted. Not diaphoretic. No erythema. No pallor.  Psychiatric: Normal mood and affect. Behavior, judgment, thought content normal.    Data Reviewed: I have personally reviewed following labs and imaging studies  CBC: Recent Labs  Lab 09/21/17 0337 09/22/17 0517 09/23/17 0349 09/24/17 0612 09/25/17 0404  WBC 10.4 7.0 10.4 9.1 7.4  NEUTROABS 8.7*  --   --   --   --  HGB 11.8* 11.2* 10.0* 9.0* 8.5*  HCT 34.2* 33.6* 29.4* 26.9* 24.8*  MCV 95.5 96.3 95.5 96.1 96.1  PLT 209 170 153 157 989   Basic Metabolic Panel: Recent Labs  Lab 09/21/17 0337 09/21/17 1414 09/22/17 0517 09/23/17 0349 09/24/17 0612 09/25/17 0404  NA 129*  --  130* 131* 130* 129*  K 3.8  --  3.8 4.3 4.2 3.7  CL 94*  --  98* 97* 99* 97*  CO2 23  --  22 23 23 22   GLUCOSE 148*  --  122* 149* 110* 98  BUN 12  --  7 8 8 10   CREATININE 1.00  --  0.97 0.99 0.88 0.94  CALCIUM 8.7*  --  8.7* 8.6* 8.2* 8.3*  MG  --   1.6* 2.0  --   --   --    GFR: Estimated Creatinine Clearance: 55.5 mL/min (by C-G formula based on SCr of 0.94 mg/dL). Liver Function Tests: Recent Labs  Lab 09/21/17 0337 09/22/17 0517  AST 28 22  ALT 21 15  ALKPHOS 97 87  BILITOT 0.7 0.6  PROT 6.3* 5.9*  ALBUMIN 4.1 3.6   No results for input(s): LIPASE, AMYLASE in the last 168 hours. No results for input(s): AMMONIA in the last 168 hours. Coagulation Profile: No results for input(s): INR, PROTIME in the last 168 hours. Cardiac Enzymes: Recent Labs  Lab 09/21/17 0210 09/21/17 0337 09/21/17 1123  TROPONINI <0.03 <0.03 <0.03   BNP (last 3 results) No results for input(s): PROBNP in the last 8760 hours. HbA1C: No results for input(s): HGBA1C in the last 72 hours. CBG: Recent Labs  Lab 09/22/17 0814 09/23/17 0800 09/24/17 0744 09/24/17 2023 09/25/17 0515  GLUCAP 98 120* 95 121* 92   Lipid Profile: No results for input(s): CHOL, HDL, LDLCALC, TRIG, CHOLHDL, LDLDIRECT in the last 72 hours. Thyroid Function Tests: No results for input(s): TSH, T4TOTAL, FREET4, T3FREE, THYROIDAB in the last 72 hours. Anemia Panel: No results for input(s): VITAMINB12, FOLATE, FERRITIN, TIBC, IRON, RETICCTPCT in the last 72 hours. Urine analysis:    Component Value Date/Time   COLORURINE STRAW (A) 09/21/2017 0210   APPEARANCEUR CLEAR 09/21/2017 0210   LABSPEC 1.008 09/21/2017 0210   PHURINE 5.0 09/21/2017 0210   GLUCOSEU NEGATIVE 09/21/2017 0210   HGBUR SMALL (A) 09/21/2017 0210   BILIRUBINUR NEGATIVE 09/21/2017 0210   KETONESUR NEGATIVE 09/21/2017 0210   PROTEINUR NEGATIVE 09/21/2017 0210   UROBILINOGEN 0.2 07/03/2013 0839   NITRITE NEGATIVE 09/21/2017 0210   LEUKOCYTESUR NEGATIVE 09/21/2017 0210   Sepsis Labs: @LABRCNTIP (procalcitonin:4,lacticidven:4)    Surgical PCR screen     Status: None   Collection Time: 09/21/17  2:45 PM  Result Value Ref Range Status   MRSA, PCR NEGATIVE NEGATIVE Final   Staphylococcus aureus  NEGATIVE NEGATIVE Final      Radiology Studies: Dg Chest Port 1 View  Result Date: 09/24/2017 CLINICAL DATA:  Hypoxia. EXAM: PORTABLE CHEST 1 VIEW COMPARISON:  Chest x-ray dated September 21, 2017. FINDINGS: Stable mild cardiomegaly. Insert vascular the lungs remain hyperinflated. Postsurgical changes in the right lung with unchanged pleural thickening and scarring at the right lung base. New left basilar atelectasis. No focal consolidation, pleural effusion, or pneumothorax. No acute osseous abnormality. IMPRESSION: 1. New left basilar atelectasis.  No active disease. 2. Stable postsurgical changes and scarring in the right lung. Electronically Signed   By: Titus Dubin M.D.   On: 09/24/2017 11:05   Dg Knee Complete 4 Views Left  Result Date:  09/22/2017 CLINICAL DATA:  ORIF tibial plateau. EXAM: DG C-ARM 61-120 MIN; LEFT KNEE - COMPLETE 4+ VIEW COMPARISON:  CT of the left knee 09/21/2017. FLUOROSCOPY TIME:  Fluoroscopy Time:  2 minutes 9 seconds. Number of Acquired Spot Images: 0 FINDINGS: Eight intraoperative fluoroscopic spot images are submitted. The lateral tibial plateau fracture is reduced. Lateral plate and screw fixation is applied. Gross anatomic reduction is achieved. There is no radiographic evidence for complication. IMPRESSION: ORIF of a left tibial plateau fracture with lateral plate and screw fixation. No radiographic evidence for complication. Electronically Signed   By: San Morelle M.D.   On: 09/22/2017 12:28   Dg Knee Left Port  Result Date: 09/22/2017 CLINICAL DATA:  Fracture fixation EXAM: PORTABLE LEFT KNEE - 1-2 VIEW COMPARISON:  09/21/2017, 09/20/2017 FINDINGS: Depressed lateral tibial plateau fracture has been reduced. Fracture extends into the joint space which is now normal in height. Interval placement of lateral plate and screws in the proximal tibia. Gas and fluid in the suprapatellar bursa. Avulsion fracture of the medial femoral condyle unchanged.  Nondisplaced fracture proximal fibular unchanged. IMPRESSION: Interval reduction and plate fixation of depressed lateral tibial plateau fracture now in good position Fracture of medial femoral condyle and proximal fibula unchanged. Electronically Signed   By: Franchot Gallo M.D.   On: 09/22/2017 13:34   Dg C-arm 1-60 Min  Result Date: 09/22/2017 CLINICAL DATA:  ORIF tibial plateau. EXAM: DG C-ARM 61-120 MIN; LEFT KNEE - COMPLETE 4+ VIEW COMPARISON:  CT of the left knee 09/21/2017. FLUOROSCOPY TIME:  Fluoroscopy Time:  2 minutes 9 seconds. Number of Acquired Spot Images: 0 FINDINGS: Eight intraoperative fluoroscopic spot images are submitted. The lateral tibial plateau fracture is reduced. Lateral plate and screw fixation is applied. Gross anatomic reduction is achieved. There is no radiographic evidence for complication. IMPRESSION: ORIF of a left tibial plateau fracture with lateral plate and screw fixation. No radiographic evidence for complication. Electronically Signed   By: San Morelle M.D.   On: 09/22/2017 12:28   Dg C-arm 1-60 Min  Result Date: 09/22/2017 CLINICAL DATA:  ORIF tibial plateau. EXAM: DG C-ARM 61-120 MIN; LEFT KNEE - COMPLETE 4+ VIEW COMPARISON:  CT of the left knee 09/21/2017. FLUOROSCOPY TIME:  Fluoroscopy Time:  2 minutes 9 seconds. Number of Acquired Spot Images: 0 FINDINGS: Eight intraoperative fluoroscopic spot images are submitted. The lateral tibial plateau fracture is reduced. Lateral plate and screw fixation is applied. Gross anatomic reduction is achieved. There is no radiographic evidence for complication. IMPRESSION: ORIF of a left tibial plateau fracture with lateral plate and screw fixation. No radiographic evidence for complication. Electronically Signed   By: San Morelle M.D.   On: 09/22/2017 12:28     Scheduled Meds: . docusate sodium  100 mg Oral BID  . folic acid  1 mg Oral Daily  . losartan  50 mg Oral Daily  . metoprolol succinate  50 mg  Oral Daily  . multivitamin with minerals  1 tablet Oral Daily  . pantoprazole  40 mg Oral Daily  . rOPINIRole  0.5 mg Oral QHS  . simvastatin  20 mg Oral QPM  . thiamine  100 mg Oral Daily   Or  . thiamine  100 mg Intravenous Daily   Continuous Infusions: . lactated ringers Stopped (09/23/17 0438)  . methocarbamol (ROBAXIN)  IV       LOS: 4 days    Time spent: 25 minutes  Greater than 50% of the time spent on counseling and  coordinating the care.   Leisa Lenz, MD [facility] Pager [phone]  If 7PM-7AM, please contact night-coverage [email or web address] Password [facility] 09/25/2017, 12:13 PM

## 2017-09-25 NOTE — Progress Notes (Signed)
09/25/17 2100  PT Visit Information  Last PT Received On 09/25/17  Assistance Needed +1  History of Present Illness 72 y.o. female admitted after slipping on water with L knee pain and resultant L leg tibial plateau fx, L MCL avulsion fx, and Left meniscal tear.  She underwent ORIF and repair on 09/22/17 and is NWB left leg in hinged knee brace post op.  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  Subjective Data  Subjective reports she is fearful of another fall  Patient Stated Goal to get better so she can get back to traveling with her husband  Precautions  Precautions Fall;Knee  Precaution Booklet Issued Yes (comment)  Restrictions  Weight Bearing Restrictions Yes  LLE Weight Bearing NWB  Pain Assessment  Pain Assessment Faces  Faces Pain Scale 8  Pain Location L knee   Pain Descriptors / Indicators Aching;Burning  Pain Intervention(s) Limited activity within patient's tolerance;Monitored during session;Premedicated before session;Repositioned  Cognition  Arousal/Alertness Awake/alert  Behavior During Therapy Anxious;Impulsive  Overall Cognitive Status Within Functional Limits for tasks assessed  Bed Mobility  Overal bed mobility Needs Assistance  Bed Mobility Sit to Supine  Supine to sit Min assist;HOB elevated  Sit to supine Mod assist  General bed mobility comments Mod A to manage LLE over EOB and to decreased pain  Transfers  Overall transfer level Needs assistance  Equipment used Rolling walker (2 wheeled);1 person hand held assist  Transfer via Lift Equipment Stedy  Stand pivot transfers Min assist;Mod assist  Lateral/Scoot Transfers Min assist;Mod assist  General transfer comment Mod A +2 to power into standing. Second person also maintaining NWB status and managing LLE during descent. Max A for balance in stand pivot with RW. Pt able to maintain NWB but fatigued quickly  Ambulation/Gait  General Gait Details able to  SPT only  Balance  Overall balance assessment Needs assistance  Sitting-balance support Feet supported  Sitting balance-Leahy Scale Fair  Sitting balance - Comments UE's for support.  Postural control Posterior lean  Standing balance support During functional activity;Bilateral upper extremity supported  Standing balance-Leahy Scale Poor  Standing balance comment Reliant on UE support  General Comments  General comments (skin integrity, edema, etc.) 2 person assist for gait  Exercises  Exercises Total Joint  Total Joint Exercises  Ankle Circles/Pumps AROM;Both;20 reps  PT - End of Session  Equipment Utilized During Treatment Right knee immobilizer (hinged brace)  Activity Tolerance Patient limited by pain  Patient left in chair;with call bell/phone within reach;with family/visitor present  Nurse Communication Mobility status;Other (comment);Weight bearing status;Precautions  PT - Assessment/Plan  PT Plan Current plan remains appropriate  PT Visit Diagnosis Dizziness and giddiness (R42)  AM-PAC PT "6 Clicks" Daily Activity Outcome Measure  Difficulty turning over in bed (including adjusting bedclothes, sheets and blankets)? 1  Difficulty moving from lying on back to sitting on the side of the bed?  1  Difficulty sitting down on and standing up from a chair with arms (e.g., wheelchair, bedside commode, etc,.)? 1  Help needed moving to and from a bed to chair (including a wheelchair)? 3  Help needed walking in hospital room? 3  Help needed climbing 3-5 steps with a railing?  3  6 Click Score 12  Mobility G Code  CL  PT Goal Progression  Progress towards PT goals Progressing toward goals  PT Time Calculation  PT Start Time (ACUTE ONLY) 1529 (1529)  PT  Stop Time (ACUTE ONLY) 1553  PT Time Calculation (min) (ACUTE ONLY) 24 min  PT G-Codes **NOT FOR INPATIENT CLASS**  Functional Assessment Tool Used AM-PAC 6 Clicks Basic Mobility  Functional Limitation Mobility: Walking and moving  around  Mobility: Walking and Moving Around Current Status (L8937) CL  PT General Charges  $$ ACUTE PT VISIT 1 Visit  PT Treatments  $Therapeutic Exercise 8-22 mins  $Therapeutic Activity 8-22 mins   Mee Hives, PT MS Acute Rehab Dept. Number: [place] [facility] [place]

## 2017-09-25 NOTE — Care Management Note (Signed)
Case Management Note  Patient Details  Name: Zuriyah Shatz MRN: [id number] Date of Birth: Jan 16, 1946  Subjective/Objective:        Hypokalemia, recent fall, ORIF, Post Op day 3           Action/Plan: CM following for progression of care; patient is for SNF placement; Percell Locus SW working on transition of care.  Expected Discharge Date:    possibly 09/26/2017              Expected Discharge Plan:   short term SNF  In-House Referral:   SW  Status of Service:   In progress  Sherrilyn Rist [phone] 09/25/2017, 9:51 AM

## 2017-09-25 NOTE — Progress Notes (Signed)
Orthopaedic Trauma Progress Note  S: Pain doing okay. No significant complaints this AM  O:  Vitals:   09/25/17 0621 09/25/17 0755  BP: (!) 168/75 (!) 180/67  Pulse: 86 81  Resp:  17  Temp: 98.8 F (37.1 C) 98.5 F (36.9 C)  SpO2: 97% 96%   Gen: NAD, AAOx3 LLE: Dressing taken down, incision clean, dry and intact. Compartments are soft and compressible. Neuro intact distally  Labs:  CBC    Component Value Date/Time   WBC 7.4 09/25/2017 0404   RBC 2.58 (L) 09/25/2017 0404   HGB 8.5 (L) 09/25/2017 0404   HGB 13.3 07/06/2017 0832   HCT 24.8 (L) 09/25/2017 0404   HCT 38.8 07/06/2017 0832   PLT 184 09/25/2017 0404   PLT 227 07/06/2017 0832   MCV 96.1 09/25/2017 0404   MCV 93.3 07/06/2017 0832   MCH 32.9 09/25/2017 0404   MCHC 34.3 09/25/2017 0404   RDW 12.7 09/25/2017 0404   RDW 13.6 07/06/2017 0832   LYMPHSABS 1.2 09/21/2017 0337   LYMPHSABS 2.3 07/06/2017 0832   MONOABS 0.5 09/21/2017 0337   MONOABS 0.7 07/06/2017 0832   EOSABS 0.0 09/21/2017 0337   EOSABS 0.4 07/06/2017 0832   BASOSABS 0.0 09/21/2017 0337   BASOSABS 0.0 07/06/2017 0832     A/P: 72 year old female s/p ORIF of left tibial plateau fracture  -NWB LLE -Hinged knee brace locked in extension at night. Unlocked during the day. Must wear when out of bed -Dry dressing, may change every other day -Follow up with me in 2 weeks -DC instructions for ortho filled out in navigation  Shona Needles, MD [facility] [phone] (phone)

## 2017-09-25 NOTE — NC FL2 (Signed)
[place] LEVEL OF CARE SCREENING TOOL     IDENTIFICATION  Patient Name: [name] [facility] Birthdate: [birth year] Sex: female Admission Date (Current Location): 09/20/2017  [facility] and [place] Number:  Herbalist and Address:  [facility], [place] [address] [place], [place] [address]      Provider Number: [id number]  Attending Physician Name and Address:  Robbie Lis, MD  Relative Name and Phone Number:       Current Level of Care: Hospital Recommended Level of Care: [place] Prior Approval Number:    Date Approved/Denied:   PASRR Number: [id number]  Discharge Plan: SNF    Current Diagnoses: Patient Active Problem List   Diagnosis Date Noted  . Hypokalemia 09/21/2017  . Hyponatremia 09/21/2017  . Tibial plateau fracture 09/21/2017  . Postural dizziness with presyncope 09/21/2017  . Pleural effusion on right 04/05/2017  . Aortic atherosclerosis ([place])   . Syncope 05/10/2016  . Restless legs syndrome 05/10/2016  . History of lung cancer 07/09/2013  . Hyperlipidemia   . Hypertensive heart disease without CHF   . Lung cancer, Right upper lobe     Orientation RESPIRATION BLADDER Height & Weight     Self, Time, Situation, Place  Normal Continent Weight: 77.1 kg (170 lb) Height:  5\' 5"  (165.1 cm)  BEHAVIORAL SYMPTOMS/MOOD NEUROLOGICAL BOWEL NUTRITION STATUS      Continent Diet(Please see DC Summary)  AMBULATORY STATUS COMMUNICATION OF NEEDS Skin   Limited Assist Verbally Surgical wounds(Closed incision on leg)                       Personal Care Assistance Level of Assistance  Bathing, Feeding, Dressing Bathing Assistance: Maximum assistance Feeding assistance: Independent Dressing Assistance: Limited assistance     Functional Limitations Info             SPECIAL CARE FACTORS FREQUENCY  PT (By licensed PT), OT (By licensed OT)     PT Frequency: 5x/week OT Frequency: 3x/week             Contractures      Additional Factors Info  Code Status, Allergies Code Status Info: Full Allergies Info: NKA           Current Medications (09/25/2017):  This is the current hospital active medication list Current Facility-Administered Medications  Medication Dose Route Frequency Provider Last Rate Last Dose  . docusate sodium (COLACE) capsule 100 mg  100 mg Oral BID Robbie Lis, MD   100 mg at 09/24/17 2132  . folic acid (FOLVITE) tablet 1 mg  1 mg Oral Daily Reyne Dumas, MD   1 mg at 09/24/17 2297  . hydrALAZINE (APRESOLINE) injection 10 mg  10 mg Intravenous Q6H PRN Reyne Dumas, MD   10 mg at 09/24/17 1627  . HYDROcodone-acetaminophen (NORCO/VICODIN) 5-325 MG per tablet 1-2 tablet  1-2 tablet Oral Q6H PRN Toy Baker, MD   2 tablet at 09/25/17 0037  . lactated ringers infusion   Intravenous Continuous Catalina Gravel, MD   Stopped at 09/23/17 [phone]  . losartan (COZAAR) tablet 50 mg  50 mg Oral Daily Reyne Dumas, MD   50 mg at 09/24/17 1194  . methocarbamol (ROBAXIN) tablet 500 mg  500 mg Oral Q6H PRN Toy Baker, MD   500 mg at 09/24/17 2132   Or  . methocarbamol (ROBAXIN) 500 mg in dextrose 5 % 50 mL IVPB  500 mg Intravenous Q6H PRN Doutova,  Anastassia, MD      . metoprolol succinate (TOPROL-XL) 24 hr tablet 50 mg  50 mg Oral Daily Reyne Dumas, MD   50 mg at 09/24/17 0837  . morphine 4 MG/ML injection 0.52 mg  0.52 mg Intravenous Q2H PRN Toy Baker, MD   0.52 mg at 09/21/17 0901  . multivitamin with minerals tablet 1 tablet  1 tablet Oral Daily Reyne Dumas, MD   1 tablet at 09/24/17 0838  . ondansetron (ZOFRAN) injection 4 mg  4 mg Intravenous Q6H PRN Fransico Meadow, PA-C   4 mg at 09/24/17 1631  . pantoprazole (PROTONIX) EC tablet 40 mg  40 mg Oral Daily Toy Baker, MD   40 mg at 09/24/17 5597  . rOPINIRole (REQUIP) tablet 0.5 mg  0.5 mg Oral QHS Doutova, Anastassia, MD   0.5 mg at 09/24/17 2132  . simvastatin (ZOCOR)  tablet 20 mg  20 mg Oral QPM Doutova, Anastassia, MD   20 mg at 09/24/17 1821  . sodium chloride flush (NS) 0.9 % injection 3 mL  3 mL Intravenous Q12H Doutova, Anastassia, MD      . thiamine (VITAMIN B-1) tablet 100 mg  100 mg Oral Daily Reyne Dumas, MD   100 mg at 09/24/17 4163   Or  . thiamine (B-1) injection 100 mg  100 mg Intravenous Daily Reyne Dumas, MD   100 mg at 09/21/17 1222     Discharge Medications: Please see discharge summary for a list of discharge medications.  Relevant Imaging Results:  Relevant Lab Results:   Additional Information SSN: [place], [place]

## 2017-09-25 NOTE — Care Management Important Message (Signed)
Important Message  Patient Details  Name: [name] MRN: [id number] Date of Birth: 05-09-1946   Medicare Important Message Given:  Yes    Orbie Pyo 09/25/2017, 1:03 PM

## 2017-09-25 NOTE — Progress Notes (Signed)
Patient may dc to [facility] when stable.   Percell Locus Maisley Hainsworth LCSW [phone]

## 2017-09-26 DIAGNOSIS — S82142A Displaced bicondylar fracture of left tibia, initial encounter for closed fracture: Principal | ICD-10-CM

## 2017-09-26 DIAGNOSIS — T148XXA Other injury of unspecified body region, initial encounter: Secondary | ICD-10-CM

## 2017-09-26 DIAGNOSIS — E871 Hypo-osmolality and hyponatremia: Secondary | ICD-10-CM

## 2017-09-26 LAB — CBC
HCT: 25.5 % — ABNORMAL LOW (ref 36.0–46.0)
Hemoglobin: 8.7 g/dL — ABNORMAL LOW (ref 12.0–15.0)
MCH: 32.3 pg (ref 26.0–34.0)
MCHC: 34.1 g/dL (ref 30.0–36.0)
MCV: 94.8 fL (ref 78.0–100.0)
PLATELETS: 221 10*3/uL (ref 150–400)
RBC: 2.69 MIL/uL — ABNORMAL LOW (ref 3.87–5.11)
RDW: 12.4 % (ref 11.5–15.5)
WBC: 6.4 10*3/uL (ref 4.0–10.5)

## 2017-09-26 LAB — BASIC METABOLIC PANEL
Anion gap: 13 (ref 5–15)
BUN: 9 mg/dL (ref 6–20)
CHLORIDE: 95 mmol/L — AB (ref 101–111)
CO2: 23 mmol/L (ref 22–32)
Calcium: 8.5 mg/dL — ABNORMAL LOW (ref 8.9–10.3)
Creatinine, Ser: 0.86 mg/dL (ref 0.44–1.00)
Glucose, Bld: 105 mg/dL — ABNORMAL HIGH (ref 65–99)
Potassium: 3.5 mmol/L (ref 3.5–5.1)
SODIUM: 131 mmol/L — AB (ref 135–145)

## 2017-09-26 LAB — GLUCOSE, CAPILLARY: GLUCOSE-CAPILLARY: 103 mg/dL — AB (ref 65–99)

## 2017-09-26 MED ORDER — LOSARTAN POTASSIUM 50 MG PO TABS
100.0000 mg | ORAL_TABLET | Freq: Every day | ORAL | Status: DC
  Administered 2017-09-26: 100 mg via ORAL
  Filled 2017-09-26: qty 2

## 2017-09-26 MED ORDER — DOCUSATE SODIUM 100 MG PO CAPS: 100.0000 mg | ORAL_CAPSULE | Freq: Two times a day (BID) | ORAL | 0 refills | Status: DC

## 2017-09-26 MED ORDER — METHOCARBAMOL 500 MG PO TABS: 500.0000 mg | ORAL_TABLET | Freq: Four times a day (QID) | ORAL | 0 refills | Status: DC | PRN

## 2017-09-26 MED ORDER — LOSARTAN POTASSIUM 100 MG PO TABS: 100.0000 mg | ORAL_TABLET | Freq: Every day | ORAL | 0 refills | Status: AC

## 2017-09-26 NOTE — Progress Notes (Signed)
Hurshel Keys Bergsma to be D/C'd to SNF per MD order.  Discussed with the patient and all questions fully answered.  VSS, Skin clean, dry and intact without evidence of skin break down, no evidence of skin tears noted. IV catheter discontinued intact. Site without signs and symptoms of complications. Dressing and pressure applied.  An After Visit Summary was printed and given to the patient. Patient  Prescription sent to pharmacy.  D/c education completed with patient/family including follow up instructions, medication list, d/c activities limitations if indicated, with other d/c instructions as indicated by MD - patient able to verbalize understanding, all questions fully answered.   Patient instructed to return to ED, call 911, or call MD for any changes in condition.   Patient escorted via stretcher, and D/C to SNF via PTAR.  Xylan Sheils L Price 09/26/2017 1:05 PM

## 2017-09-26 NOTE — Discharge Summary (Signed)
Physician Discharge Summary [facility]       Patient: [name]                   Admit date: 09/20/2017   DOB: 1946-04-12             Discharge date:09/26/2017/10:33 AM [id number]                           PCP: [name], MD Recommendations for Outpatient Follow-up:   1.  Please follow-up with your primary care physician within 1-2 weeks. 2.  These follow-up with your orthopedic doctor Dr. Lennette Bihari Haddix   Discharge Condition: Stable  CODE STATUS:  Full code   Diet recommendation:  Regular healthy diet  ----------------------------------------------------------------------------------------------------------------------  Discharge Diagnoses:   Active Problems:   Hyperlipidemia   Lung cancer, Right upper lobe   Syncope   Hypokalemia   Hyponatremia   Tibial plateau fracture   Postural dizziness with presyncope   History of present illness :  72 y.o.femalewith medical history significant of LUNG cancer sp resectionspchemotherapy complicated by right pleural effusion, COPD,history of syncopal spells, presents with a fall. Patient found to have a sodium of 128. X-ray showed commuted proximal tibial fracture. Underwent ORIF 09/22/2017.  Hospital course / Brief Summary:    Syncope - Vasovagal, concern however alcohol dependence and electrolyte abnormalities - Per PT - SNF recommended -Rest of her workup was negative   Hyperlipidemia - Continue Zocor   Essential hypertension - Continue Losartan and metoprolol   Tibial plateau fracture - S/P ORIF left bicondylar tibial plateau fracture, repair of left lateral meniscus tear and closed treatment of left medial femoral epicondyle avulsion fracture    CKD, stage 2 - Cr WNL  Anemia of CKD - Hgb stable    Hypokalemia / Hypomagnesemia  - Supplemented   Hyponatremia - Long standing - Worsening on admission due to dehydration - Sodium 131 this am  Lung cancer / Acute respiratory  failure with hypoxia  - Right upper lung lobe - Management per oncology  - CXR 2/17 showed new left basilar atelectasis.  No active disease. Stable postsurgical changes and scarring in the right lung   Disposition Plan:  Nursing facility    Consultations: Ortho: Dr. Lennette Bihari Haddix     Procedures: S/P ORIF left bicondylar tibial plateau fracture, repair of left lateral meniscus tear and closed treatment of left medial femoral epicondyle avulsion fracture     ----------------------------------------------------------------------------------------------------------------------  Discharge Instructions:   Discharge Instructions    Activity as tolerated - No restrictions   Complete by:  As directed    Activity as tolerated, please follow specific instruction from orthopedic team Evaluation with PT/OT   Diet general   Complete by:  As directed    Discharge instructions   Complete by:  As directed    Fall precaution, aggressive PT OT per Ortho recommendation Close follow with Ortho. BMP q. weekly monitoring sodium level No restriction with sodium intake   Increase activity slowly   Complete by:  As directed        Medication List    TAKE these medications   calcium carbonate 500 MG chewable tablet Commonly known as:  TUMS - dosed in mg elemental calcium Chew 1 tablet by mouth as needed for indigestion or heartburn.   diltiazem 240 MG 24 hr capsule Commonly known as:  CARDIZEM CD Take 240 mg by mouth daily.   docusate sodium 100  MG capsule Commonly known as:  COLACE Take 1 capsule (100 mg total) by mouth 2 (two) times daily.   esomeprazole 40 MG capsule Commonly known as:  NEXIUM Take 40 mg by mouth daily before breakfast.   furosemide 20 MG tablet Commonly known as:  LASIX Take 1 tablet (20 mg total) by mouth daily. What changed:  when to take this   KLOR-CON 10 10 MEQ tablet Generic drug:  potassium chloride Take 10 mEq by mouth 3 (three) times a week.     LORazepam 1 MG tablet Commonly known as:  ATIVAN Take 1 mg by mouth daily.   losartan 100 MG tablet Commonly known as:  COZAAR Take 1 tablet (100 mg total) by mouth daily. Start taking on:  09/27/2017 What changed:    medication strength  how much to take  Another medication with the same name was removed. Continue taking this medication, and follow the directions you see here.   methocarbamol 500 MG tablet Commonly known as:  ROBAXIN Take 1 tablet (500 mg total) by mouth every 6 (six) hours as needed for muscle spasms.   metoprolol succinate 50 MG 24 hr tablet Commonly known as:  TOPROL-XL Take 50 mg by mouth daily. Take with or immediately following a meal.   multivitamin with minerals Tabs tablet Take 1 tablet by mouth daily.   oxymetazoline 0.05 % nasal spray Commonly known as:  AFRIN Place 1 spray into both nostrils as needed for congestion.   rOPINIRole 0.25 MG tablet Commonly known as:  REQUIP Take 0.5 mg by mouth at bedtime.   simvastatin 20 MG tablet Commonly known as:  ZOCOR Take 20 mg by mouth every evening.   TYLENOL 8 HOUR 650 MG CR tablet Generic drug:  acetaminophen Take 650 mg by mouth every 8 (eight) hours as needed for pain.      Follow-up Information    Haddix, Thomasene Lot, MD. Schedule an appointment as soon as possible for a visit in 2 week(s).   Specialty:  Orthopedic Surgery Contact information: [address] [place] [address] [phone]          No Known Allergies    Procedures/Studies: Dg Chest 1 View  Result Date: 09/20/2017 CLINICAL DATA:  Slipped and fell on water at restaurant. EXAM: CHEST 1 VIEW COMPARISON:  Chest radiograph June 01, 2017 FINDINGS: Cardiac silhouette is mildly enlarged and unchanged. Mildly calcified aortic knob. Chronic RIGHT lung base pleural thickening and scarring. No focal consolidation. No pneumothorax. Included osseous structures are non suspicious. IMPRESSION: Stable cardiomegaly  and RIGHT lung base pleural thickening/scarring. Electronically Signed   By: Elon Alas M.D.   On: 09/20/2017 22:19   Dg Thoracic Spine 2 View  Result Date: 09/20/2017 CLINICAL DATA:  72 year old female status post slip and fall at rest trauma. Pain. Personal history of lung cancer status post right upper lobectomy. EXAM: THORACIC SPINE 2 VIEWS COMPARISON:  Chest CT 07/06/2017 FINDINGS: Thoracic segmentation appears normal. There is levoconvex scoliosis of the lower thoracic spine in conjunction with lumbar scoliosis, resulting in some rotation of the lower thoracic vertebrae. Superimposed flowing lower thoracic endplate osteophytes. T9 butterfly vertebra, more apparent on the comparison CT. Cervicothoracic junction alignment is within normal limits. Stable thoracic vertebral height and alignment. Posterior ribs appear intact. Visualized thoracic visceral contours are stable. IMPRESSION: No acute osseous abnormality identified in the thoracic spine. Electronically Signed   By: Genevie Ann M.D.   On: 09/20/2017 22:20   Dg Lumbar Spine  Complete  Result Date: 09/20/2017 CLINICAL DATA:  72 year old female status post slip and fall at restaurant. Pain. EXAM: LUMBAR SPINE - COMPLETE 4+ VIEW COMPARISON:  Lumbar radiographs 09/20/2015. FINDINGS: Chronic moderate to severe lumbar scoliosis with a rotatory component. Stable lumbar vertebral height and alignment since 2017. Associated chronic severe lumbar disc space loss and bulky endplate osteophytosis. Stable visible sacrum and SI joints. Visible lower thoracic levels appear stable. No acute osseous abnormality identified. Negative visible bowel gas pattern. Calcified aortic atherosclerosis. IMPRESSION: 1.  No acute osseous abnormality identified in the lumbar spine. 2. Chronic severe lumbar scoliosis, disc, and endplate degeneration. Electronically Signed   By: Genevie Ann M.D.   On: 09/20/2017 22:14   Ct Knee Left Wo Contrast  Result Date: 09/21/2017 CLINICAL  DATA:  Fracture of the knee after fall. EXAM: CT OF THE  KNEE WITHOUT CONTRAST TECHNIQUE: Multidetector CT imaging of the knee was performed according to the standard protocol. Multiplanar CT image reconstructions were also generated. COMPARISON:  None. FINDINGS: Bones/Joint/Cartilage There is an acute, closed, bicondylar fracture of the tibial plateau with up to 19 mm of central depression involving the lateral tibial plateau and with comminution. A sagittal oblique fracture without depression involving the medial tibial plateau is also noted exiting through the medial tibial metadiaphysis, series 6, image 53. Fracture also appears to undermining the tibial spine. There is an associated moderate suprapatellar lipohemarthrosis. A comminuted fibular head and neck fracture is noted without displacement. A fracture along the lateral cortex of the medial femoral condyle is also present. Ligaments Suboptimally assessed by CT. Given fracture of the medial femoral condyle along the expected course of the MCL, injury to the MCL is not excluded. Muscles and Tendons No intramuscular hemorrhage or atrophy. Soft tissues Posttraumatic subcutaneous soft tissue induration about the knee. IMPRESSION: 1. Acute bicondylar fracture of the tibial plateau with central depression of the lateral tibial plateau to 19 mm with comminution. Sagittal oblique fracture through the medial tibial plateau is noted. Fracture appears to also undermines the tibial spine. Findings would be in keeping with a Schatzker type IV fracture. 2. Nondisplaced fracture of the medial femoral condyle along its outer cortex. Suspect medial collateral ligament injury given location of this fracture. 3. Comminuted fibular head and neck fracture without displacement. 4. Lipohemarthrosis. Electronically Signed   By: Ashley Royalty M.D.   On: 09/21/2017 01:07   Dg Chest Port 1 View  Result Date: 09/24/2017 CLINICAL DATA:  Hypoxia. EXAM: PORTABLE CHEST 1 VIEW  COMPARISON:  Chest x-ray dated September 21, 2017. FINDINGS: Stable mild cardiomegaly. Insert vascular the lungs remain hyperinflated. Postsurgical changes in the right lung with unchanged pleural thickening and scarring at the right lung base. New left basilar atelectasis. No focal consolidation, pleural effusion, or pneumothorax. No acute osseous abnormality. IMPRESSION: 1. New left basilar atelectasis.  No active disease. 2. Stable postsurgical changes and scarring in the right lung. Electronically Signed   By: Titus Dubin M.D.   On: 09/24/2017 11:05   Portable Chest 1 View  Result Date: 09/21/2017 CLINICAL DATA:  Preoperative evaluation.  History of lung cancer. EXAM: PORTABLE CHEST 1 VIEW COMPARISON:  Chest radiograph September 20, 2017 FINDINGS: Cardiac silhouette is mildly enlarged. Mildly calcified aortic arch. Similar hyperinflation with RIGHT lung base pleural thickening and scarring. RIGHT apical opacification and volume loss. Trachea projects midline and there is no pneumothorax. Soft tissue planes and included osseous structures are non-suspicious. IMPRESSION: Stable cardiomegaly and RIGHT lung base pleural thickening/scarring. RIGHT apical scarring,  less likely consolidation. Aortic Atherosclerosis (ICD10-I70.0). Electronically Signed   By: Elon Alas M.D.   On: 09/21/2017 02:59   Dg Knee Complete 4 Views Left  Result Date: 09/22/2017 CLINICAL DATA:  ORIF tibial plateau. EXAM: DG C-ARM 61-120 MIN; LEFT KNEE - COMPLETE 4+ VIEW COMPARISON:  CT of the left knee 09/21/2017. FLUOROSCOPY TIME:  Fluoroscopy Time:  2 minutes 9 seconds. Number of Acquired Spot Images: 0 FINDINGS: Eight intraoperative fluoroscopic spot images are submitted. The lateral tibial plateau fracture is reduced. Lateral plate and screw fixation is applied. Gross anatomic reduction is achieved. There is no radiographic evidence for complication. IMPRESSION: ORIF of a left tibial plateau fracture with lateral plate and  screw fixation. No radiographic evidence for complication. Electronically Signed   By: San Morelle M.D.   On: 09/22/2017 12:28   Dg Knee Complete 4 Views Left  Result Date: 09/20/2017 CLINICAL DATA:  72 year old female slipped and fell at restaurant with severe left knee pain. EXAM: LEFT KNEE - COMPLETE 4+ VIEW COMPARISON:  None. FINDINGS: No straight AP view of the left knee is provided. Comminuted nondisplaced fracture of the left fibula head is suspected. Comminuted fracture of the proximal left tibia is identified and is associated with depression of the left lateral tibial plateau. The medial plateau appears to remain intact. The patella appears intact. The distal left femur appears intact. Only a small joint effusion is evident. IMPRESSION: 1. Comminuted fracture of the proximal left tibia with depressed lateral tibial plateau. 2. Nondisplaced comminuted fracture of the left fibula head. Electronically Signed   By: Genevie Ann M.D.   On: 09/20/2017 22:17   Dg Knee Left Port  Result Date: 09/22/2017 CLINICAL DATA:  Fracture fixation EXAM: PORTABLE LEFT KNEE - 1-2 VIEW COMPARISON:  09/21/2017, 09/20/2017 FINDINGS: Depressed lateral tibial plateau fracture has been reduced. Fracture extends into the joint space which is now normal in height. Interval placement of lateral plate and screws in the proximal tibia. Gas and fluid in the suprapatellar bursa. Avulsion fracture of the medial femoral condyle unchanged. Nondisplaced fracture proximal fibular unchanged. IMPRESSION: Interval reduction and plate fixation of depressed lateral tibial plateau fracture now in good position Fracture of medial femoral condyle and proximal fibula unchanged. Electronically Signed   By: Franchot Gallo M.D.   On: 09/22/2017 13:34   Dg C-arm 1-60 Min  Result Date: 09/22/2017 CLINICAL DATA:  ORIF tibial plateau. EXAM: DG C-ARM 61-120 MIN; LEFT KNEE - COMPLETE 4+ VIEW COMPARISON:  CT of the left knee 09/21/2017.  FLUOROSCOPY TIME:  Fluoroscopy Time:  2 minutes 9 seconds. Number of Acquired Spot Images: 0 FINDINGS: Eight intraoperative fluoroscopic spot images are submitted. The lateral tibial plateau fracture is reduced. Lateral plate and screw fixation is applied. Gross anatomic reduction is achieved. There is no radiographic evidence for complication. IMPRESSION: ORIF of a left tibial plateau fracture with lateral plate and screw fixation. No radiographic evidence for complication. Electronically Signed   By: San Morelle M.D.   On: 09/22/2017 12:28   Dg C-arm 1-60 Min  Result Date: 09/22/2017 CLINICAL DATA:  ORIF tibial plateau. EXAM: DG C-ARM 61-120 MIN; LEFT KNEE - COMPLETE 4+ VIEW COMPARISON:  CT of the left knee 09/21/2017. FLUOROSCOPY TIME:  Fluoroscopy Time:  2 minutes 9 seconds. Number of Acquired Spot Images: 0 FINDINGS: Eight intraoperative fluoroscopic spot images are submitted. The lateral tibial plateau fracture is reduced. Lateral plate and screw fixation is applied. Gross anatomic reduction is achieved. There is no radiographic evidence for  complication. IMPRESSION: ORIF of a left tibial plateau fracture with lateral plate and screw fixation. No radiographic evidence for complication. Electronically Signed   By: San Morelle M.D.   On: 09/22/2017 12:28      Subjective: Patient was seen and examined 09/26/2017, 10:33 AM Patient stable  Today. No acute distress.  No issues overnight Stable for discharge.  Discharge Exam:  Vitals:   09/26/17 0553 09/26/17 0556 09/26/17 0802 09/26/17 1023  BP:  (!) 167/64 (!) 180/71 130/84  Pulse:  74 82 86  Resp:  18 17   Temp:  98.1 F (36.7 C) 98.8 F (37.1 C)   TempSrc:  Oral Oral   SpO2:  99% 97%   Weight: 76 kg (167 lb 8.8 oz)     Height:        General: Pt lying comfortably in bed & appears in no obvious distress. Cardiovascular: S1 & S2 heard, RRR, S1/S2 +. No murmurs, rubs, gallops or clicks. No JVD or pedal  edema. Respiratory: Clear to auscultation without wheezing, rhonchi or crackles. No increased work of breathing. Abdominal:  Non distended, non tender & soft. No organomegaly or masses appreciated. Normal bowel sounds heard. CNS: Alert and oriented. No focal deficits. Extremities: no edema, no cyanosis    The results of significant diagnostics from this hospitalization (including imaging, microbiology, ancillary and laboratory) are listed below for reference.     Microbiology: Recent Results (from the past 240 hour(s))  Surgical PCR screen     Status: None   Collection Time: 09/21/17  2:45 PM  Result Value Ref Range Status   MRSA, PCR NEGATIVE NEGATIVE Final   Staphylococcus aureus NEGATIVE NEGATIVE Final    Comment: (NOTE) The Xpert SA Assay (FDA approved for NASAL specimens in patients 25 years of age and older), is one component of a comprehensive surveillance program. It is not intended to diagnose infection nor to guide or monitor treatment. Performed at [facility], [place] [address] [place], [place] [address]      Labs: CBC: Recent Labs  Lab 09/21/17 [phone] 09/22/17 0517 09/23/17 0349 09/24/17 0612 09/25/17 0404 09/26/17 0331  WBC 10.4 7.0 10.4 9.1 7.4 6.4  NEUTROABS 8.7*  --   --   --   --   --   HGB 11.8* 11.2* 10.0* 9.0* 8.5* 8.7*  HCT 34.2* 33.6* 29.4* 26.9* 24.8* 25.5*  MCV 95.5 96.3 95.5 96.1 96.1 94.8  PLT 209 170 153 157 184 740   Basic Metabolic Panel: Recent Labs  Lab 09/21/17 1414 09/22/17 0517 09/23/17 0349 09/24/17 0612 09/25/17 0404 09/26/17 0331  NA  --  130* 131* 130* 129* 131*  K  --  3.8 4.3 4.2 3.7 3.5  CL  --  98* 97* 99* 97* 95*  CO2  --  22 23 23 22 23   GLUCOSE  --  122* 149* 110* 98 105*  BUN  --  7 8 8 10 9   CREATININE  --  0.97 0.99 0.88 0.94 0.86  CALCIUM  --  8.7* 8.6* 8.2* 8.3* 8.5*  MG 1.6* 2.0  --   --   --   --    Liver Function Tests: Recent Labs  Lab 09/21/17 0337 09/22/17 0517  AST 28 22  ALT 21 15   ALKPHOS 97 87  BILITOT 0.7 0.6  PROT 6.3* 5.9*  ALBUMIN 4.1 3.6   BNP (last 3 results) No results for input(s): BNP in the last 8760 hours. Cardiac Enzymes: Recent Labs  Lab 09/21/17 0210 09/21/17 0337 09/21/17 1123  TROPONINI <0.03 <0.03 <0.03   CBG: Recent Labs  Lab 09/23/17 0800 09/24/17 0744 09/24/17 2023 09/25/17 0515 09/26/17 0600  GLUCAP 120* 95 121* 92 103*   Hgb A1c No results for input(s): HGBA1C in the last 72 hours. Lipid Profile No results for input(s): CHOL, HDL, LDLCALC, TRIG, CHOLHDL, LDLDIRECT in the last 72 hours. Thyroid function studies No results for input(s): TSH, T4TOTAL, T3FREE, THYROIDAB in the last 72 hours.  Invalid input(s): FREET3 Anemia work up No results for input(s): VITAMINB12, FOLATE, FERRITIN, TIBC, IRON, RETICCTPCT in the last 72 hours. Urinalysis    Component Value Date/Time   COLORURINE STRAW (A) 09/21/2017 0210   APPEARANCEUR CLEAR 09/21/2017 0210   LABSPEC 1.008 09/21/2017 0210   PHURINE 5.0 09/21/2017 0210   GLUCOSEU NEGATIVE 09/21/2017 0210   HGBUR SMALL (A) 09/21/2017 0210   BILIRUBINUR NEGATIVE 09/21/2017 0210   KETONESUR NEGATIVE 09/21/2017 0210   PROTEINUR NEGATIVE 09/21/2017 0210   UROBILINOGEN 0.2 07/03/2013 0839   NITRITE NEGATIVE 09/21/2017 0210   LEUKOCYTESUR NEGATIVE 09/21/2017 0210    Time coordinating discharge: Over 30 minutes  SIGNED: Deatra James, MD, FACP, FHM. [facility],  Pager [phone]  If 7PM-7AM, please contact night-coverage [email or web address] Ota [facility] 09/26/2017, 10:33 AM

## 2017-09-26 NOTE — Progress Notes (Addendum)
Clinical Social Worker facilitated patient discharge including contacting patient family and facility to confirm patient discharge plans.  Clinical information faxed to facility and family agreeable with plan.  CSW arranged ambulance transport via [facility] to [facility] .  RN to call [phone] (pt will go in room 704) for  report prior to discharge.  Clinical Social Worker will sign off for now as social work intervention is no longer needed. Please consult [place] again if new need arises.  Rhea Pink, MSW, [facility]

## 2017-10-25 ENCOUNTER — Encounter (HOSPITAL_COMMUNITY): Payer: Self-pay | Admitting: Student

## 2018-01-11 ENCOUNTER — Encounter (HOSPITAL_COMMUNITY): Payer: Self-pay

## 2018-01-11 ENCOUNTER — Ambulatory Visit (HOSPITAL_COMMUNITY)
Admission: RE | Admit: 2018-01-11 | Discharge: 2018-01-11 | Disposition: A | Discharge status: Home / Self Care | Payer: Medicare Other | Source: Ambulatory Visit | Attending: Internal Medicine | Admitting: Internal Medicine

## 2018-01-11 ENCOUNTER — Inpatient Hospital Stay: Payer: Medicare Other

## 2018-01-11 ENCOUNTER — Inpatient Hospital Stay: Payer: Medicare Other | Attending: Internal Medicine

## 2018-01-11 DIAGNOSIS — J439 Emphysema, unspecified: Secondary | ICD-10-CM | POA: Diagnosis not present

## 2018-01-11 DIAGNOSIS — Z85118 Personal history of other malignant neoplasm of bronchus and lung: Secondary | ICD-10-CM | POA: Insufficient documentation

## 2018-01-11 DIAGNOSIS — I700 Atherosclerosis of aorta: Secondary | ICD-10-CM | POA: Insufficient documentation

## 2018-01-11 DIAGNOSIS — C3490 Malignant neoplasm of unspecified part of unspecified bronchus or lung: Secondary | ICD-10-CM | POA: Insufficient documentation

## 2018-01-11 DIAGNOSIS — I10 Essential (primary) hypertension: Secondary | ICD-10-CM | POA: Diagnosis not present

## 2018-01-11 DIAGNOSIS — Z902 Acquired absence of lung [part of]: Secondary | ICD-10-CM | POA: Insufficient documentation

## 2018-01-11 LAB — COMPREHENSIVE METABOLIC PANEL
ALBUMIN: 4.7 g/dL (ref 3.5–5.0)
ALK PHOS: 163 U/L — AB (ref 40–150)
ALT: 6 U/L (ref 0–55)
ANION GAP: 10 (ref 3–11)
AST: 20 U/L (ref 5–34)
BUN: 15 mg/dL (ref 7–26)
CALCIUM: 10 mg/dL (ref 8.4–10.4)
CO2: 26 mmol/L (ref 22–29)
Chloride: 96 mmol/L — ABNORMAL LOW (ref 98–109)
Creatinine, Ser: 1.27 mg/dL — ABNORMAL HIGH (ref 0.60–1.10)
GFR calc Af Amer: 48 mL/min — ABNORMAL LOW (ref >60)
GFR calc non Af Amer: 41 mL/min — ABNORMAL LOW (ref >60)
GLUCOSE: 112 mg/dL (ref 70–140)
POTASSIUM: 4.7 mmol/L (ref 3.5–5.1)
SODIUM: 132 mmol/L — AB (ref 136–145)
Total Bilirubin: 0.5 mg/dL (ref 0.2–1.2)
Total Protein: 7.5 g/dL (ref 6.4–8.3)

## 2018-01-11 LAB — CBC WITH DIFFERENTIAL/PLATELET
BASOS PCT: 1 %
Basophils Absolute: 0.1 10*3/uL (ref 0.0–0.1)
EOS ABS: 0 10*3/uL (ref 0.0–0.5)
Eosinophils Relative: 1 %
HEMATOCRIT: 40.7 % (ref 34.8–46.6)
Hemoglobin: 13.7 g/dL (ref 11.6–15.9)
Lymphocytes Relative: 27 %
Lymphs Abs: 1.9 10*3/uL (ref 0.9–3.3)
MCH: 30.9 pg (ref 25.1–34.0)
MCHC: 33.7 g/dL (ref 31.5–36.0)
MCV: 91.8 fL (ref 79.5–101.0)
MONO ABS: 0.5 10*3/uL (ref 0.1–0.9)
MONOS PCT: 7 %
NEUTROS ABS: 4.4 10*3/uL (ref 1.5–6.5)
Neutrophils Relative %: 64 %
Platelets: 247 10*3/uL (ref 145–400)
RBC: 4.44 MIL/uL (ref 3.70–5.45)
RDW: 15.1 % — ABNORMAL HIGH (ref 11.2–14.5)
WBC: 6.8 10*3/uL (ref 3.9–10.3)

## 2018-01-11 MED ORDER — IOPAMIDOL (ISOVUE-300) INJECTION 61%
INTRAVENOUS | Status: AC
  Administered 2018-01-11: 65 mL via INTRAVENOUS
  Filled 2018-01-11: qty 100

## 2018-01-18 ENCOUNTER — Telehealth: Payer: Self-pay | Admitting: Internal Medicine

## 2018-01-18 ENCOUNTER — Inpatient Hospital Stay: Payer: Medicare Other | Admitting: Internal Medicine

## 2018-01-18 NOTE — Telephone Encounter (Signed)
Called patient back to reschedule her missed appointment per 6/13 voicemail

## 2018-01-19 NOTE — Assessment & Plan Note (Addendum)
This is a very pleasant 72 year old white female with synchronous non-small cell lung cancer presenting with stage IA as well as a stage IB status post right upper lobectomy with lymph node dissection and currently undergoing adjuvant chemotherapy with cisplatin and Alimta status post 4 cycles completed in March 2015. The patient is currently on observation and she is feeling fine.  The patient was seen with Dr. Julien Nordmann. The recent CT scan of the chest showed no evidence for disease progression.  Recommend for her to continue observation.   The patient will follow-up in 1 year with a restaging CT scan of the chest prior to that visit.  For hypertension, recommend she follow-up with her primary care provider for titration of her medications.  The patient was advised to call immediately if she has any concerning symptoms in the interval.  The patient voices understanding of current disease status and treatment options and is in agreement with the current care plan. All questions were answered. The patient knows to call the clinic with any problems, questions or concerns. We can certainly see the patient much sooner if necessary.

## 2018-01-19 NOTE — Progress Notes (Signed)
[facility] OFFICE PROGRESS NOTE  [name], MD [address] [place] [address]  DIAGNOSIS: Cancer of upper lobe of lung  Primary site: Lung (Right)  Staging method: AJCC 7th Edition  Clinical: (T1a, N0)  Pathologic: Stage IA (T1a, N0, cM0) signed by [name], MD on 07/09/2013 1:58 PM  Summary: Stage IA (T1a, N0, cM0)  Lung cancer, Right upper lobe  Primary site: Lung (Right)  Pathologic: Stage IB (T2a, N0, cM0) signed by [name], MD on 07/09/2013 1:54 PM  Summary: Stage IB (T2a, N0, cM0)   PRIOR THERAPY: 1) Status post right video-assisted thoracoscopy, wedge resection, right upper lobe with completion of right upper lobectomy with lymph node dissection under the care of Dr. Servando Snare on 07/08/2013. 2) Adjuvant chemotherapy with systemic chemotherapy in the form of cisplatin at 75 mg per meter squared and Alimta 500 mg per meter squared given every 3 weeks for a total of 4 cycles, status post 4 cycles. Last cycle was given 10/23/2013.  CURRENT THERAPY: Observation  INTERVAL HISTORY: [name] 72 y.o. female returns for routine follow-up visit accompanied by her husband.  The patient is feeling fine today has no specific complaints.  Since her last visit, she fractured her left knee and underwent surgery and rehabilitation.  She is not getting around much better.  She has minimal pain to the knee.  She denies fevers and chills.  Nonproductive cough which is unchanged from prior reports.  Denies chest pain, shortness breath, hemoptysis.  Denies nausea, vomiting, constipation, diarrhea.  She reports that she has a good appetite but she did lose weight while she was in rehabilitation.  She is slowly gaining it back.  Denies night sweats.  The patient had a recent restaging CT scan is here to discuss the results.  MEDICAL HISTORY: Past Medical History:  Diagnosis Date  . Arthritis    back   . Cavitating mass in right upper lung lobe   .  CKD (chronic kidney disease) stage 3, GFR 30-59 ml/min (HCC) 03/12/2015  . Diverticulosis   . Dizziness    occasionally   . Family history of kidney infection    pt history-saw Dr.Wrenn  . GERD (gastroesophageal reflux disease)    takes Nexium daily  . History of colon polyps   . Hyperlipidemia    takes Zocor daily  . Hypertension    takes Amlodipine daily and Chlorthalidone as well  . Lung cancer, Right upper lobe   . Lung nodules    right upper lobe  . Nocturia   . Pleural effusion on right 04/05/2017    ALLERGIES:  has No Known Allergies.  MEDICATIONS:  Current Outpatient Medications  Medication Sig Dispense Refill  . acetaminophen (TYLENOL 8 HOUR) 650 MG CR tablet Take 650 mg by mouth every 8 (eight) hours as needed for pain.    . calcium carbonate (TUMS - DOSED IN MG ELEMENTAL CALCIUM) 500 MG chewable tablet Chew 1 tablet by mouth as needed for indigestion or heartburn.    . diltiazem (CARDIZEM CD) 240 MG 24 hr capsule Take 240 mg by mouth daily.    Marland Kitchen docusate sodium (COLACE) 100 MG capsule Take 1 capsule (100 mg total) by mouth 2 (two) times daily. 10 capsule 0  . esomeprazole (NEXIUM) 40 MG capsule Take 40 mg by mouth daily before breakfast.    . furosemide (LASIX) 20 MG tablet Take 1 tablet (20 mg total) by mouth daily. (Patient taking differently: Take  20 mg by mouth 3 (three) times a week. ) 15 tablet 0  . KLOR-CON 10 10 MEQ tablet Take 10 mEq by mouth 3 (three) times a week.   4  . LORazepam (ATIVAN) 1 MG tablet Take 1 mg by mouth daily.    Marland Kitchen losartan (COZAAR) 100 MG tablet Take 1 tablet (100 mg total) by mouth daily. 30 tablet 0  . methocarbamol (ROBAXIN) 500 MG tablet Take 1 tablet (500 mg total) by mouth every 6 (six) hours as needed for muscle spasms. 20 tablet 0  . metoprolol succinate (TOPROL-XL) 50 MG 24 hr tablet Take 50 mg by mouth daily. Take with or immediately following a meal.    . Multiple Vitamin (MULTIVITAMIN WITH MINERALS) TABS tablet Take 1 tablet by  mouth daily.    Marland Kitchen oxymetazoline (AFRIN) 0.05 % nasal spray Place 1 spray into both nostrils as needed for congestion.    Marland Kitchen rOPINIRole (REQUIP) 0.25 MG tablet Take 0.5 mg by mouth at bedtime.     . simvastatin (ZOCOR) 20 MG tablet Take 20 mg by mouth every evening.     No current facility-administered medications for this visit.     SURGICAL HISTORY:  Past Surgical History:  Procedure Laterality Date  . ABDOMINAL HYSTERECTOMY     at age 69;partial   . CARDIOVASCULAR STRESS TEST  06/29/2009   EF 79%, NO ISCHEMIA  . COLONOSCOPY    . IR THORACENTESIS ASP PLEURAL SPACE W/IMG GUIDE  04/25/2017  . ORIF TIBIA PLATEAU Left 09/22/2017   Procedure: OPEN REDUCTION INTERNAL FIXATION (ORIF) TIBIAL PLATEAU;  Surgeon: Shona Needles, MD;  Location: [facility];  Service: Orthopedics;  Laterality: Left;  . TRACHEOSTOMY     at age 57 or 6 d/t polyps on vocal cord  . VIDEO ASSISTED THORACOSCOPY (VATS)/WEDGE RESECTION Right 07/08/2013   Procedure: VIDEO ASSISTED THORACOSCOPY (VATS)/WEDGE RESECTION;  Surgeon: [name], MD;  Location: [facility];  Service: Thoracic;  Laterality: Right;  Marland Kitchen VIDEO BRONCHOSCOPY N/A 07/08/2013   Procedure: VIDEO BRONCHOSCOPY;  Surgeon: [name], MD;  Location: [facility] OR;  Service: Thoracic;  Laterality: N/A;    REVIEW OF SYSTEMS:   Review of Systems  Constitutional: Negative for appetite change, chills, fatigue, fever.  She lost weight when she fractured her leg and underwent rehabilitation.  She is getting back some of her lost weight.  HENT:   Negative for mouth sores, nosebleeds, sore throat and trouble swallowing.   Eyes: Negative for eye problems and icterus.  Respiratory: Negative for hemoptysis, shortness of breath and wheezing.  Positive for intermittent nonproductive cough. Cardiovascular: Negative for chest pain and leg swelling.  Gastrointestinal: Negative for abdominal pain, constipation, diarrhea, nausea and vomiting.  Genitourinary: Negative for bladder  incontinence, difficulty urinating, dysuria, frequency and hematuria.   Musculoskeletal: Negative for back pain, gait problem, neck pain and neck stiffness. Positive for intermittent left knee pain. Skin: Negative for itching and rash.  Neurological: Negative for dizziness, extremity weakness, gait problem, headaches, light-headedness and seizures.  Hematological: Negative for adenopathy. Does not bruise/bleed easily.  Psychiatric/Behavioral: Negative for confusion, depression and sleep disturbance. The patient is not nervous/anxious.     PHYSICAL EXAMINATION:  Blood pressure (!) 169/73, pulse 64, temperature 98.6 F (37 C), temperature source Oral, height 5\' 5"  (1.651 m), weight 148 lb 11.2 oz (67.4 kg), SpO2 99 %.  ECOG PERFORMANCE STATUS: 1 - Symptomatic but completely ambulatory  Physical Exam  Constitutional: Oriented to person, place, and time and well-developed, well-nourished, and  in no distress. No distress.  HENT:  Head: Normocephalic and atraumatic.  Mouth/Throat: Oropharynx is clear and moist. No oropharyngeal exudate.  Eyes: Conjunctivae are normal. Right eye exhibits no discharge. Left eye exhibits no discharge. No scleral icterus.  Neck: Normal range of motion. Neck supple.  Cardiovascular: Normal rate, regular rhythm, normal heart sounds and intact distal pulses.   Pulmonary/Chest: Effort normal and breath sounds normal. No respiratory distress. No wheezes. No rales.  Abdominal: Soft. Bowel sounds are normal. Exhibits no distension and no mass. There is no tenderness.  Musculoskeletal: Normal range of motion. Exhibits no edema.  Lymphadenopathy:    No cervical adenopathy.  Neurological: Alert and oriented to person, place, and time. Exhibits normal muscle tone. Gait normal. Coordination normal.  Skin: Skin is warm and dry. No rash noted. Not diaphoretic. No erythema. No pallor.  Psychiatric: Mood, memory and judgment normal.  Vitals reviewed.  LABORATORY DATA: Lab  Results  Component Value Date   WBC 6.8 01/11/2018   HGB 13.7 01/11/2018   HCT 40.7 01/11/2018   MCV 91.8 01/11/2018   PLT 247 01/11/2018      Chemistry      Component Value Date/Time   NA 132 (L) 01/11/2018 1048   NA 130 (L) 07/06/2017 0832   K 4.7 01/11/2018 1048   K 4.3 07/06/2017 0832   CL 96 (L) 01/11/2018 1048   CO2 26 01/11/2018 1048   CO2 25 07/06/2017 0832   BUN 15 01/11/2018 1048   BUN 14.8 07/06/2017 0832   CREATININE 1.27 (H) 01/11/2018 1048   CREATININE 1.2 (H) 07/06/2017 0832      Component Value Date/Time   CALCIUM 10.0 01/11/2018 1048   CALCIUM 9.5 07/06/2017 0832   ALKPHOS 163 (H) 01/11/2018 1048   ALKPHOS 136 07/06/2017 0832   AST 20 01/11/2018 1048   AST 23 07/06/2017 0832   ALT 6 01/11/2018 1048   ALT 14 07/06/2017 0832   BILITOT 0.5 01/11/2018 1048   BILITOT 0.51 07/06/2017 0832       RADIOGRAPHIC STUDIES:  Ct Chest W Contrast  Result Date: 01/11/2018 CLINICAL DATA:  Non-small-cell lung cancer. Status post right upper lobectomy 07/08/2013 EXAM: CT CHEST WITH CONTRAST TECHNIQUE: Multidetector CT imaging of the chest was performed during intravenous contrast administration. CONTRAST:  <See Chart> ISOVUE-300 IOPAMIDOL (ISOVUE-300) INJECTION 61% COMPARISON:  07/06/2017 FINDINGS: Cardiovascular: The heart size is normal. No substantial pericardial effusion. Coronary artery calcification is evident. Atherosclerotic calcification is noted in the wall of the thoracic aorta. Mediastinum/Nodes: No mediastinal lymphadenopathy. There is no hilar lymphadenopathy. The esophagus has normal imaging features. There is no axillary lymphadenopathy. Lungs/Pleura: Centrilobular and paraseptal emphysema noted. Volume loss right hemithorax compatible with previous right upper lobectomy. Similar appearance of rounded atelectasis in the posterior right lung base with underlying chronic posterior pleural thickening. No suspicious pulmonary nodule or mass. 4 mm left lower lobe  nodule (image 95/series 7) is unchanged in the interval. 4 mm left lower lobe ground-glass nodule (87/7) is stable. Upper Abdomen: Unremarkable. Musculoskeletal: Insert bone windows interval development of compression deformity at T10 and T11. IMPRESSION: 1. Stable exam. No new or progressive interval findings to suggest recurrent or metastatic disease in the chest. 2. Stable appearance of rounded atelectasis in the posterior right lung with underlying chronic pleural thickening. 3. Tiny left lower lobe pulmonary nodules, stable in the interval. Continued attention on follow-up recommended. 4.  Aortic Atherosclerois (ICD10-170.0) 5.  Emphysema. ([id number]) Electronically Signed   By: Verda Cumins.D.  On: 01/11/2018 14:59     ASSESSMENT/PLAN:  Lung cancer, Right upper lobe This is a very pleasant 72 year old white female with synchronous non-small cell lung cancer presenting with stage IA as well as a stage IB status post right upper lobectomy with lymph node dissection and currently undergoing adjuvant chemotherapy with cisplatin and Alimta status post 4 cycles completed in March 2015. The patient is currently on observation and she is feeling fine.  The patient was seen with Dr. Julien Nordmann. The recent CT scan of the chest showed no evidence for disease progression.  Recommend for her to continue observation.   The patient will follow-up in 1 year with a restaging CT scan of the chest prior to that visit.  For hypertension, recommend she follow-up with her primary care provider for titration of her medications.  The patient was advised to call immediately if she has any concerning symptoms in the interval.  The patient voices understanding of current disease status and treatment options and is in agreement with the current care plan. All questions were answered. The patient knows to call the clinic with any problems, questions or concerns. We can certainly see the patient much sooner if  necessary.   Orders Placed This Encounter  Procedures  . CT CHEST W CONTRAST    Standing Status:   Future    Standing Expiration Date:   07/25/2019    Order Specific Question:   If indicated for the ordered procedure, I authorize the administration of contrast media per Radiology protocol    Answer:   Yes    Order Specific Question:   Preferred imaging location?    Answer:   [facility]    Order Specific Question:   Radiology Contrast Protocol - do NOT remove file path    Answer:   [email or web address]    Order Specific Question:   Reason for Exam additional comments    Answer:   Lung cancer. Restaging.  Marland Kitchen CBC with Differential (Cancer Center Only)    Standing Status:   Future    Standing Expiration Date:   07/25/2019  . CMP ([facility] only)    Standing Status:   Future    Standing Expiration Date:   07/25/2019   Mikey Bussing, DNP, AGPCNP-BC, AOCNP 01/22/18  ADDENDUM: Hematology/Oncology Attending: I had a face-to-face encounter with the patient today.  I recommended her care plan.  This is a very pleasant 72 years old white female with history of a stage Ia non-small cell lung cancer as well as a stage Ib diagnosed and December 2014 status post wedge resection followed by adjuvant systemic chemotherapy.  The patient has been on observation since that time and she is feeling fine.  Repeat CT scan of the chest performed recently showed no concerning findings for disease recurrence or progression.  I discussed the scan results with the patient and recommended for her to continue on observation with repeat CT scan of the chest in 1 year. She was advised to call immediately if she has any concerning symptoms in the interval.  Disclaimer: This note was dictated with voice recognition software. Similar sounding words can inadvertently be transcribed and may be missed upon review. Eilleen Kempf, MD 01/22/18

## 2018-01-22 ENCOUNTER — Inpatient Hospital Stay (HOSPITAL_BASED_OUTPATIENT_CLINIC_OR_DEPARTMENT_OTHER): Payer: Medicare Other | Admitting: Oncology

## 2018-01-22 ENCOUNTER — Encounter: Payer: Self-pay | Admitting: Oncology

## 2018-01-22 ENCOUNTER — Other Ambulatory Visit: Payer: Self-pay

## 2018-01-22 ENCOUNTER — Telehealth: Payer: Self-pay | Admitting: Oncology

## 2018-01-22 VITALS — BP 169/73 | HR 64 | Temp 98.6°F | Ht 65.0 in | Wt 148.7 lb

## 2018-01-22 DIAGNOSIS — I10 Essential (primary) hypertension: Secondary | ICD-10-CM | POA: Diagnosis not present

## 2018-01-22 DIAGNOSIS — C3411 Malignant neoplasm of upper lobe, right bronchus or lung: Secondary | ICD-10-CM

## 2018-01-22 DIAGNOSIS — Z85118 Personal history of other malignant neoplasm of bronchus and lung: Secondary | ICD-10-CM | POA: Diagnosis not present

## 2018-01-22 NOTE — Telephone Encounter (Signed)
Scheduled appt per 6/17 los- gave patient aVS and calender per los. Central radiology to contact patient with ct scan.

## 2018-03-08 ENCOUNTER — Encounter: Payer: Medicare Other | Admitting: Cardiothoracic Surgery

## 2018-06-26 ENCOUNTER — Other Ambulatory Visit: Payer: Self-pay | Admitting: Internal Medicine

## 2018-06-26 ENCOUNTER — Ambulatory Visit
Admission: RE | Admit: 2018-06-26 | Discharge: 2018-06-26 | Disposition: A | Discharge status: Home / Self Care | Payer: Medicare Other | Source: Ambulatory Visit | Attending: Internal Medicine | Admitting: Internal Medicine

## 2018-06-26 DIAGNOSIS — M542 Cervicalgia: Secondary | ICD-10-CM

## 2018-06-26 DIAGNOSIS — R05 Cough: Secondary | ICD-10-CM

## 2018-06-26 DIAGNOSIS — R059 Cough, unspecified: Secondary | ICD-10-CM

## 2019-01-22 ENCOUNTER — Ambulatory Visit (HOSPITAL_COMMUNITY)
Admission: RE | Admit: 2019-01-22 | Discharge: 2019-01-22 | Disposition: A | Discharge status: Home / Self Care | Payer: Medicare Other | Source: Ambulatory Visit | Attending: Oncology | Admitting: Oncology

## 2019-01-22 ENCOUNTER — Other Ambulatory Visit: Payer: Self-pay

## 2019-01-22 ENCOUNTER — Inpatient Hospital Stay: Payer: Medicare Other | Attending: Internal Medicine

## 2019-01-22 DIAGNOSIS — Z85118 Personal history of other malignant neoplasm of bronchus and lung: Secondary | ICD-10-CM | POA: Insufficient documentation

## 2019-01-22 DIAGNOSIS — Z902 Acquired absence of lung [part of]: Secondary | ICD-10-CM | POA: Insufficient documentation

## 2019-01-22 DIAGNOSIS — C3411 Malignant neoplasm of upper lobe, right bronchus or lung: Secondary | ICD-10-CM | POA: Insufficient documentation

## 2019-01-22 DIAGNOSIS — I10 Essential (primary) hypertension: Secondary | ICD-10-CM | POA: Insufficient documentation

## 2019-01-22 LAB — CBC WITH DIFFERENTIAL (CANCER CENTER ONLY)
Abs Immature Granulocytes: 0.03 10*3/uL (ref 0.00–0.07)
Basophils Absolute: 0 10*3/uL (ref 0.0–0.1)
Basophils Relative: 1 %
Eosinophils Absolute: 0.1 10*3/uL (ref 0.0–0.5)
Eosinophils Relative: 1 %
HCT: 40.4 % (ref 36.0–46.0)
Hemoglobin: 13.7 g/dL (ref 12.0–15.0)
Immature Granulocytes: 1 %
Lymphocytes Relative: 29 %
Lymphs Abs: 1.9 10*3/uL (ref 0.7–4.0)
MCH: 33.3 pg (ref 26.0–34.0)
MCHC: 33.9 g/dL (ref 30.0–36.0)
MCV: 98.3 fL (ref 80.0–100.0)
Monocytes Absolute: 0.6 10*3/uL (ref 0.1–1.0)
Monocytes Relative: 9 %
Neutro Abs: 3.9 10*3/uL (ref 1.7–7.7)
Neutrophils Relative %: 59 %
Platelet Count: 209 10*3/uL (ref 150–400)
RBC: 4.11 MIL/uL (ref 3.87–5.11)
RDW: 12.1 % (ref 11.5–15.5)
WBC Count: 6.5 10*3/uL (ref 4.0–10.5)
nRBC: 0 % (ref 0.0–0.2)

## 2019-01-22 LAB — CMP (CANCER CENTER ONLY)
ALT: 17 U/L (ref 0–44)
AST: 20 U/L (ref 15–41)
Albumin: 4.5 g/dL (ref 3.5–5.0)
Alkaline Phosphatase: 102 U/L (ref 38–126)
Anion gap: 12 (ref 5–15)
BUN: 16 mg/dL (ref 8–23)
CO2: 23 mmol/L (ref 22–32)
Calcium: 9.5 mg/dL (ref 8.9–10.3)
Chloride: 97 mmol/L — ABNORMAL LOW (ref 98–111)
Creatinine: 1.35 mg/dL — ABNORMAL HIGH (ref 0.44–1.00)
GFR, Est AFR Am: 45 mL/min — ABNORMAL LOW (ref >60)
GFR, Estimated: 39 mL/min — ABNORMAL LOW (ref >60)
Glucose, Bld: 107 mg/dL — ABNORMAL HIGH (ref 70–99)
Potassium: 4.4 mmol/L (ref 3.5–5.1)
Sodium: 132 mmol/L — ABNORMAL LOW (ref 135–145)
Total Bilirubin: 0.5 mg/dL (ref 0.3–1.2)
Total Protein: 7 g/dL (ref 6.5–8.1)

## 2019-01-22 MED ORDER — SODIUM CHLORIDE (PF) 0.9 % IJ SOLN
INTRAMUSCULAR | Status: AC
  Filled 2019-01-22: qty 50

## 2019-01-22 MED ORDER — IOHEXOL 300 MG/ML  SOLN
75.0000 mL | Freq: Once | INTRAMUSCULAR | Status: AC | PRN
  Administered 2019-01-22: 75 mL via INTRAVENOUS

## 2019-01-29 ENCOUNTER — Encounter: Payer: Self-pay | Admitting: Internal Medicine

## 2019-01-29 ENCOUNTER — Other Ambulatory Visit: Payer: Self-pay

## 2019-01-29 ENCOUNTER — Inpatient Hospital Stay (HOSPITAL_BASED_OUTPATIENT_CLINIC_OR_DEPARTMENT_OTHER): Payer: Medicare Other | Admitting: Internal Medicine

## 2019-01-29 VITALS — BP 182/66 | HR 64 | Temp 98.5°F | Resp 20 | Ht 65.0 in | Wt 155.1 lb

## 2019-01-29 DIAGNOSIS — Z902 Acquired absence of lung [part of]: Secondary | ICD-10-CM | POA: Diagnosis not present

## 2019-01-29 DIAGNOSIS — Z85118 Personal history of other malignant neoplasm of bronchus and lung: Secondary | ICD-10-CM

## 2019-01-29 DIAGNOSIS — C3411 Malignant neoplasm of upper lobe, right bronchus or lung: Secondary | ICD-10-CM

## 2019-01-29 DIAGNOSIS — I10 Essential (primary) hypertension: Secondary | ICD-10-CM | POA: Diagnosis not present

## 2019-01-29 DIAGNOSIS — C3490 Malignant neoplasm of unspecified part of unspecified bronchus or lung: Secondary | ICD-10-CM

## 2019-01-29 DIAGNOSIS — I119 Hypertensive heart disease without heart failure: Secondary | ICD-10-CM

## 2019-01-29 MED ORDER — CLONIDINE HCL 0.1 MG PO TABS
0.2000 mg | ORAL_TABLET | Freq: Once | ORAL | Status: AC
  Administered 2019-01-29: 0.2 mg via ORAL

## 2019-01-29 MED ORDER — CLONIDINE HCL 0.1 MG PO TABS
ORAL_TABLET | ORAL | Status: AC
  Filled 2019-01-29: qty 2

## 2019-01-29 NOTE — Progress Notes (Signed)
[facility] [phone]   [phone]) [phone]  OFFICE PROGRESS NOTE  [name], MD [address] [place] [address]  DIAGNOSIS: Cancer of upper lobe of lung  Primary site: Lung (Right)  Staging method: AJCC 7th Edition  Clinical: (T1a, N0)  Pathologic: Stage IA (T1a, N0, cM0) signed by [name], MD on 07/09/2013 1:58 PM  Summary: Stage IA (T1a, N0, cM0)  Lung cancer, Right upper lobe  Primary site: Lung (Right)  Pathologic: Stage IB (T2a, N0, cM0) signed by [name], MD on 07/09/2013 1:54 PM  Summary: Stage IB (T2a, N0, cM0)   PRIOR THERAPY:  1) Status post right video-assisted thoracoscopy, wedge resection, right upper lobe with completion of right upper lobectomy with lymph node dissection under the care of Dr. Servando Snare on 07/08/2013. 2) Adjuvant chemotherapy with systemic chemotherapy in the form of cisplatin at 75 mg per meter squared and Alimta 500 mg per meter squared given every 3 weeks for a total of 4 cycles, status post 4 cycles. Last cycle was given 10/23/2013.  CURRENT THERAPY: Observation.  INTERVAL HISTORY: [name] 72 y.o. female returns to the clinic today for annual follow-up visit.  The patient is feeling fine today with no concerning complaints except for uncontrolled hypertension.  She did not take her blood pressure medication earlier today.  The patient denied having any chest pain, shortness of breath, cough or hemoptysis.  She denied having any fever or chills.  She has no weight loss or night sweats.  The patient had repeat CT scan of the chest performed recently and she is here for evaluation and discussion of her scan results.  MEDICAL HISTORY: Past Medical History:  Diagnosis Date  . Arthritis    back   . Cavitating mass in right upper lung lobe   . CKD (chronic kidney disease) stage 3, GFR 30-59 ml/min (HCC) 03/12/2015  . Diverticulosis   . Dizziness    occasionally   . Family  history of kidney infection    pt history-saw Dr.Wrenn  . GERD (gastroesophageal reflux disease)    takes Nexium daily  . History of colon polyps   . Hyperlipidemia    takes Zocor daily  . Hypertension    takes Amlodipine daily and Chlorthalidone as well  . Lung cancer, Right upper lobe   . Lung nodules    right upper lobe  . Nocturia   . Pleural effusion on right 04/05/2017    ALLERGIES:  has No Known Allergies.  MEDICATIONS:  Current Outpatient Medications  Medication Sig Dispense Refill  . acetaminophen (TYLENOL 8 HOUR) 650 MG CR tablet Take 650 mg by mouth every 8 (eight) hours as needed for pain.    . calcium carbonate (TUMS - DOSED IN MG ELEMENTAL CALCIUM) 500 MG chewable tablet Chew 1 tablet by mouth as needed for indigestion or heartburn.    . diltiazem (CARDIZEM CD) 240 MG 24 hr capsule Take 240 mg by mouth daily.    Marland Kitchen docusate sodium (COLACE) 100 MG capsule Take 1 capsule (100 mg total) by mouth 2 (two) times daily. 10 capsule 0  . esomeprazole (NEXIUM) 40 MG capsule Take 40 mg by mouth daily before breakfast.    . furosemide (LASIX) 20 MG tablet Take 1 tablet (20 mg total) by mouth daily. (Patient taking differently: Take 20 mg by mouth 3 (three) times a week. ) 15 tablet 0  . KLOR-CON 10 10 MEQ tablet Take 10  mEq by mouth 3 (three) times a week.   4  . LORazepam (ATIVAN) 1 MG tablet Take 1 mg by mouth daily.    Marland Kitchen losartan (COZAAR) 100 MG tablet Take 1 tablet (100 mg total) by mouth daily. 30 tablet 0  . methocarbamol (ROBAXIN) 500 MG tablet Take 1 tablet (500 mg total) by mouth every 6 (six) hours as needed for muscle spasms. 20 tablet 0  . metoprolol succinate (TOPROL-XL) 50 MG 24 hr tablet Take 50 mg by mouth daily. Take with or immediately following a meal.    . Multiple Vitamin (MULTIVITAMIN WITH MINERALS) TABS tablet Take 1 tablet by mouth daily.    Marland Kitchen oxymetazoline (AFRIN) 0.05 % nasal spray Place 1 spray into both nostrils as needed for congestion.    Marland Kitchen rOPINIRole  (REQUIP) 0.25 MG tablet Take 0.5 mg by mouth at bedtime.     . simvastatin (ZOCOR) 20 MG tablet Take 20 mg by mouth every evening.     No current facility-administered medications for this visit.     SURGICAL HISTORY:  Past Surgical History:  Procedure Laterality Date  . ABDOMINAL HYSTERECTOMY     at age 17;partial   . CARDIOVASCULAR STRESS TEST  06/29/2009   EF 79%, NO ISCHEMIA  . COLONOSCOPY    . IR THORACENTESIS ASP PLEURAL SPACE W/IMG GUIDE  04/25/2017  . ORIF TIBIA PLATEAU Left 09/22/2017   Procedure: OPEN REDUCTION INTERNAL FIXATION (ORIF) TIBIAL PLATEAU;  Surgeon: Shona Needles, MD;  Location: [facility];  Service: Orthopedics;  Laterality: Left;  . TRACHEOSTOMY     at age 65 or 5 d/t polyps on vocal cord  . VIDEO ASSISTED THORACOSCOPY (VATS)/WEDGE RESECTION Right 07/08/2013   Procedure: VIDEO ASSISTED THORACOSCOPY (VATS)/WEDGE RESECTION;  Surgeon: [name], MD;  Location: [facility];  Service: Thoracic;  Laterality: Right;  Marland Kitchen VIDEO BRONCHOSCOPY N/A 07/08/2013   Procedure: VIDEO BRONCHOSCOPY;  Surgeon: [name], MD;  Location: [facility];  Service: Thoracic;  Laterality: N/A;    REVIEW OF SYSTEMS:  A comprehensive review of systems was negative.   PHYSICAL EXAMINATION: General appearance: alert, cooperative and no distress Head: Normocephalic, without obvious abnormality, atraumatic Neck: no adenopathy, no JVD, supple, symmetrical, trachea midline and thyroid not enlarged, symmetric, no tenderness/mass/nodules Lymph nodes: Cervical, supraclavicular, and axillary nodes normal. Resp: clear to auscultation bilaterally Back: symmetric, no curvature. ROM normal. No CVA tenderness. Cardio: regular rate and rhythm, S1, S2 normal, no murmur, click, rub or gallop GI: soft, non-tender; bowel sounds normal; no masses,  no organomegaly Extremities: extremities normal, atraumatic, no cyanosis or edema  ECOG PERFORMANCE STATUS: 1 - Symptomatic but completely ambulatory  Blood pressure  (!) 182/66, pulse 64, temperature 98.5 F (36.9 C), temperature source Oral, resp. rate 20, height 5\' 5"  (1.651 m), weight 155 lb 1.6 oz (70.4 kg), SpO2 97 %.  LABORATORY DATA: Lab Results  Component Value Date   WBC 6.5 01/22/2019   HGB 13.7 01/22/2019   HCT 40.4 01/22/2019   MCV 98.3 01/22/2019   PLT 209 01/22/2019      Chemistry      Component Value Date/Time   NA 132 (L) 01/22/2019 0920   NA 130 (L) 07/06/2017 0832   K 4.4 01/22/2019 0920   K 4.3 07/06/2017 0832   CL 97 (L) 01/22/2019 0920   CO2 23 01/22/2019 0920   CO2 25 07/06/2017 0832   BUN 16 01/22/2019 0920   BUN 14.8 07/06/2017 0832   CREATININE 1.35 (H) 01/22/2019 0920  CREATININE 1.2 (H) 07/06/2017 0832      Component Value Date/Time   CALCIUM 9.5 01/22/2019 0920   CALCIUM 9.5 07/06/2017 0832   ALKPHOS 102 01/22/2019 0920   ALKPHOS 136 07/06/2017 0832   AST 20 01/22/2019 0920   AST 23 07/06/2017 0832   ALT 17 01/22/2019 0920   ALT 14 07/06/2017 0832   BILITOT 0.5 01/22/2019 0920   BILITOT 0.51 07/06/2017 0832       RADIOGRAPHIC STUDIES: Ct Chest W Contrast  Result Date: 01/22/2019 CLINICAL DATA:  History of right lung cancer diagnosed December of 2014 post right upper lobectomy. EXAM: CT CHEST WITH CONTRAST TECHNIQUE: Multidetector CT imaging of the chest was performed during intravenous contrast administration. CONTRAST:  60 mL OMNIPAQUE IOHEXOL 300 MG/ML  SOLN COMPARISON:  01/11/2018 FINDINGS: Cardiovascular: Heart is normal size. Calcification at the origin/proximal left main pulmonary artery. Thoracic aorta is normal in caliber. Pulmonary arterial system is unremarkable. Remaining vascular structures are unremarkable. Mediastinum/Nodes: No evidence of mediastinal or hilar adenopathy. Air-fluid level over the distal esophagus unchanged and may be due to reflux versus dysmotility. Remaining mediastinal structures are unremarkable. Lungs/Pleura: Postsurgical change with volume loss of the right lung  compatible previous right upper lobectomy with rounded atelectasis over the posterior mid to lower right lung unchanged. Mild emphysematous disease. Few tiny stable left lung nodules with the largest measuring 4 mm over the left lower lobe. No new nodules. Pleural thickening over the posterior right lung unchanged. Airways are unremarkable. Upper Abdomen: Subtle nodular contour to the liver. No acute findings. Mild calcified plaque over the abdominal aorta. Musculoskeletal: Several mild compression fractures over the spine unchanged. Degenerative change of the spine. IMPRESSION: 1. Postsurgical change with volume loss of the right lung compatible previous right upper lobectomy for lung cancer. Stable mild pleural thickening over the posterior right lung and associated stable rounded atelectasis. Few tiny left lung nodules which are stable with the largest measuring 4 mm over the left lower lobe. No new nodules. No adenopathy. 2.  Several thoracic spine compression fractures which are stable. 3. Aortic Atherosclerosis (ICD10-I70.0) and Emphysema (ICD10-J43.9). 4.  Mild atherosclerotic coronary artery disease. 5.  Mild nodular contour of the liver which may be due to cirrhosis. Electronically Signed   By: Marin Olp M.D.   On: 01/22/2019 17:35   ASSESSMENT AND PLAN: This is a very pleasant 73 years old white female with synchronous non-small cell lung cancer presenting with stage IA as well as a stage IB status post right upper lobectomy with lymph node dissection and currently undergoing adjuvant chemotherapy with cisplatin and Alimta status post 4 cycles completed in March 2015. She is currently on observation and feeling well. She had repeat CT scan of the chest performed recently.  I personally and independently reviewed the scans and discussed the results with the patient today. Her scan showed no concerning findings for disease recurrence or progression. I recommended for the patient to continue on  observation with repeat CT scan of the chest in 1 year. For hypertension, I will give the patient a dose of clonidine 0.2 mg x 1 today.  She was also advised to take her blood pressure medication as prescribed and to reconsult with her primary care physician for adjustment of her medication if needed. The patient was advised to call immediately if she has any concerning symptoms in the interval. The patient voices understanding of current disease status and treatment options and is in agreement with the current care plan. All  questions were answered. The patient knows to call the clinic with any problems, questions or concerns. We can certainly see the patient much sooner if necessary.  Disclaimer: This note was dictated with voice recognition software. Similar sounding words can inadvertently be transcribed and may not be corrected upon review.

## 2019-01-29 NOTE — Addendum Note (Signed)
Addended by: Ardeen Garland on: 01/29/2019 09:42 AM   Modules accepted: Orders

## 2019-01-30 ENCOUNTER — Telehealth: Payer: Self-pay | Admitting: Internal Medicine

## 2019-01-30 NOTE — Telephone Encounter (Signed)
Scheduled appt per 6/23 los - f./u in one year - reminder letter mailed with appt date and time

## 2019-05-28 IMAGING — CT CT CHEST W/ CM
2 of 4 series · 15 of 36 positions shown, 18 images · IV contrast (ISOVUE 300)
Comparison: 03/15/2016

CLINICAL DATA: Right lung cancer. Status post right upper
lobectomy.

EXAM:
CT CHEST WITH CONTRAST
TECHNIQUE: Multidetector CT imaging of the chest was performed during
intravenous contrast administration.
CONTRAST:  75mL C5NU3L-133 IOPAMIDOL (C5NU3L-133) INJECTION 61%

[Series 2: axial st · axial · 0.75mm/px · z∈[+1308,+1592]mm · 12 of 166 slices shown, 15 images]
[im 12/166  mediastinal]
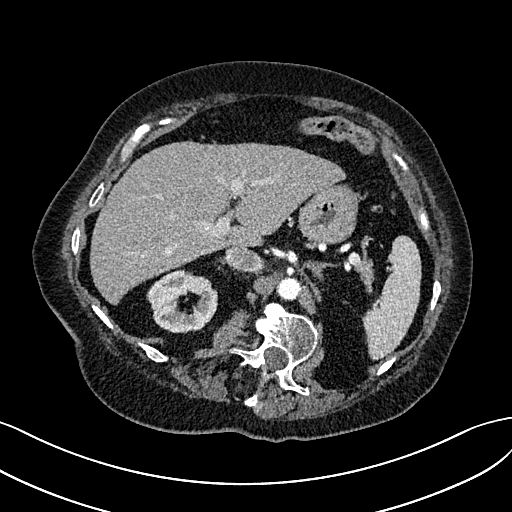
[im 12/166  lung]
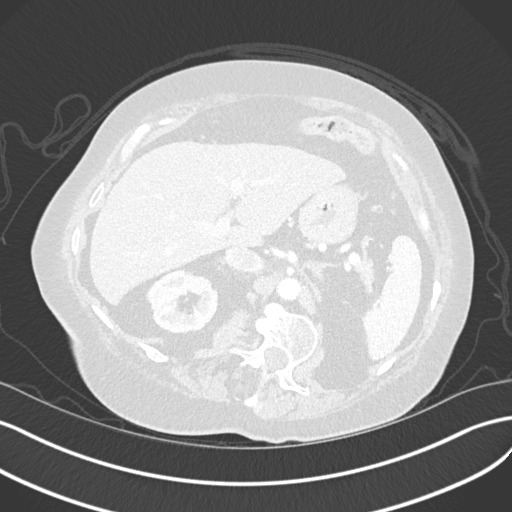
[im 24/166  lung]
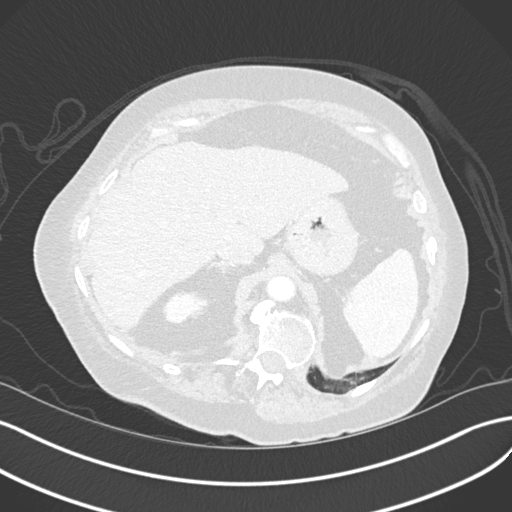
[im 36/166  lung]
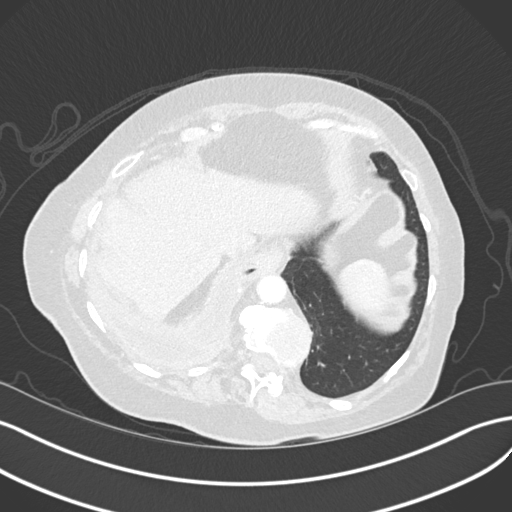
[im 48/166  lung]
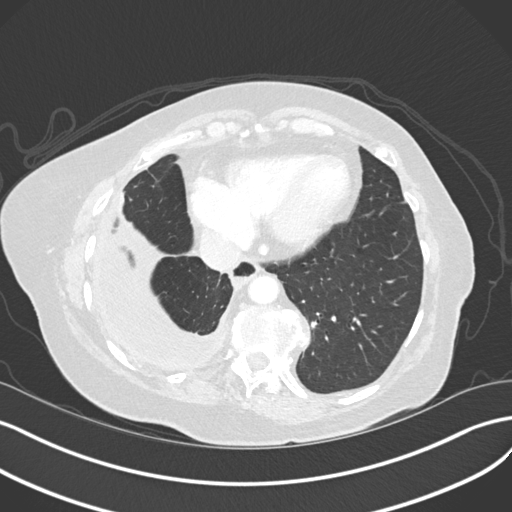
[im 59/166  mediastinal]
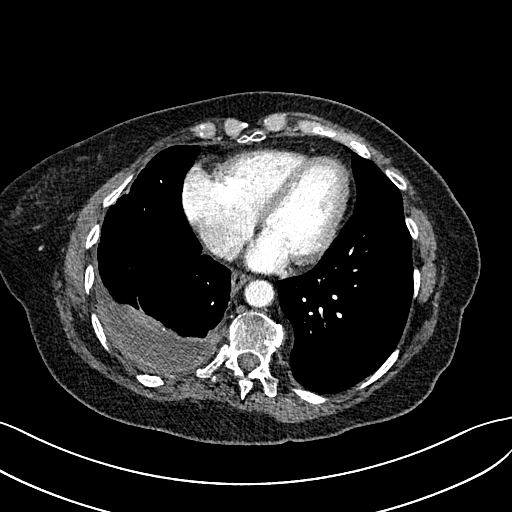
[im 59/166  lung]
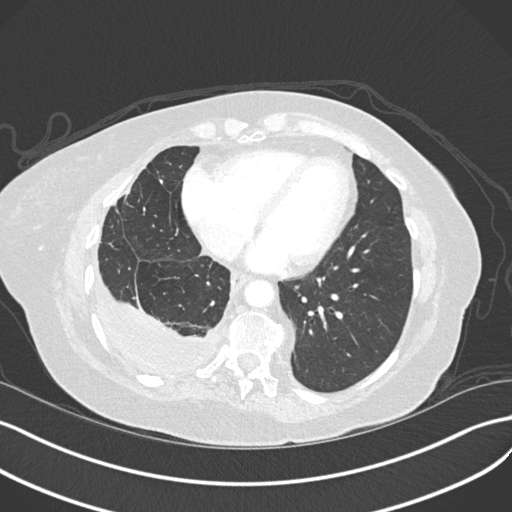
[im 71/166  lung]
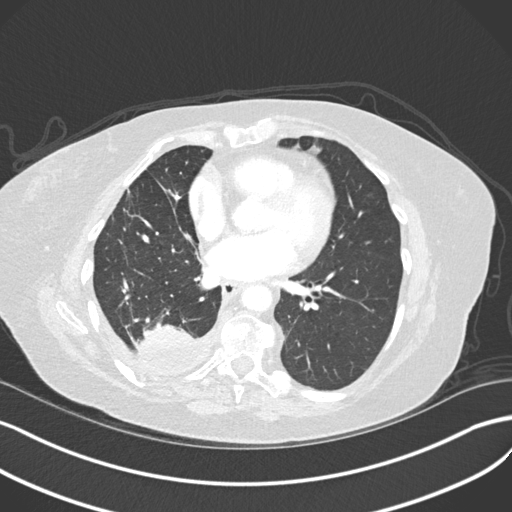
[im 95/166  lung]
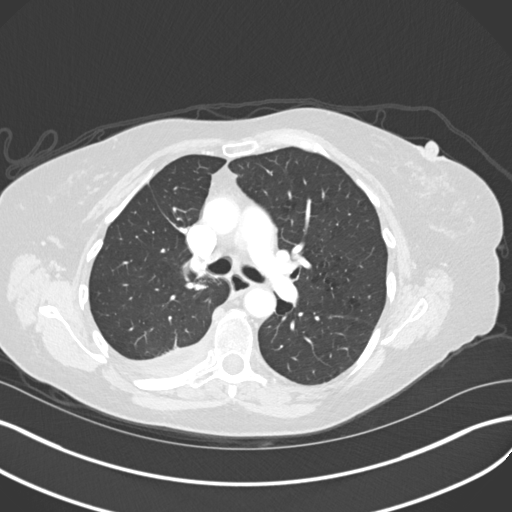
[im 107/166  lung]
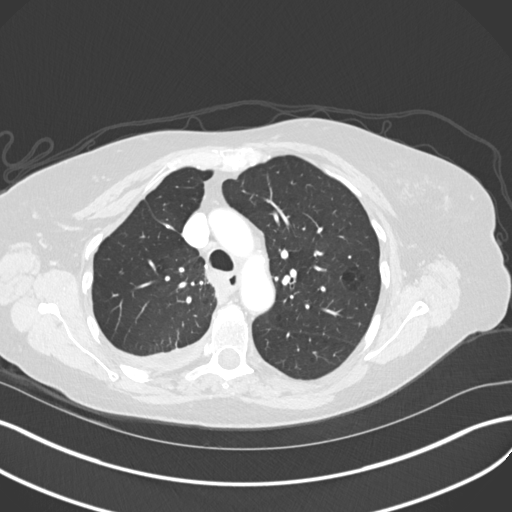
[im 118/166  mediastinal]
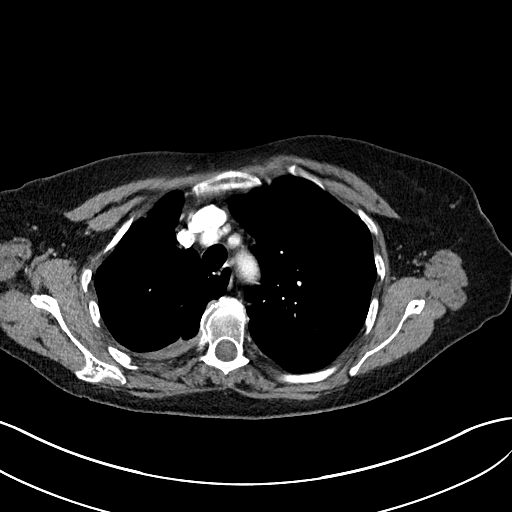
[im 118/166  lung]
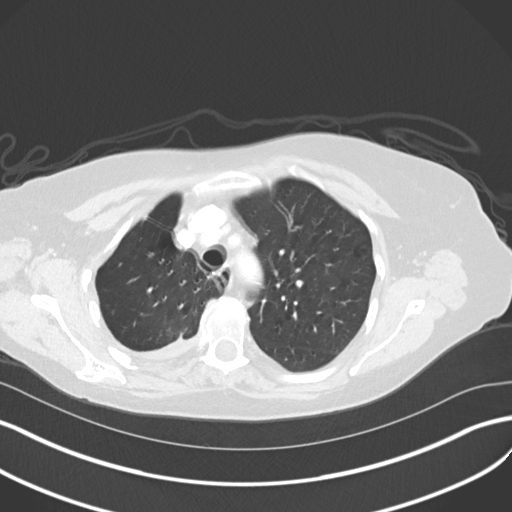
[im 130/166  lung]
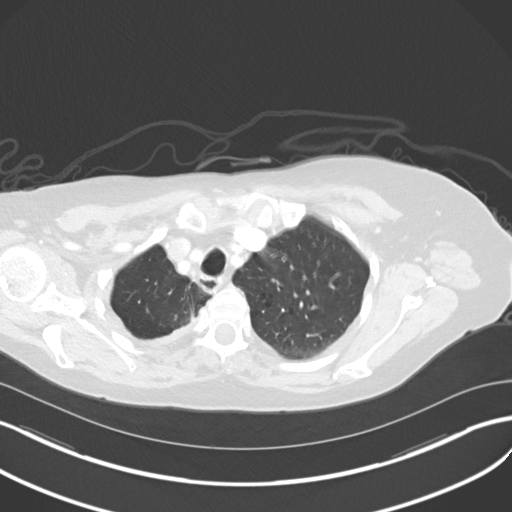
[im 142/166  lung]
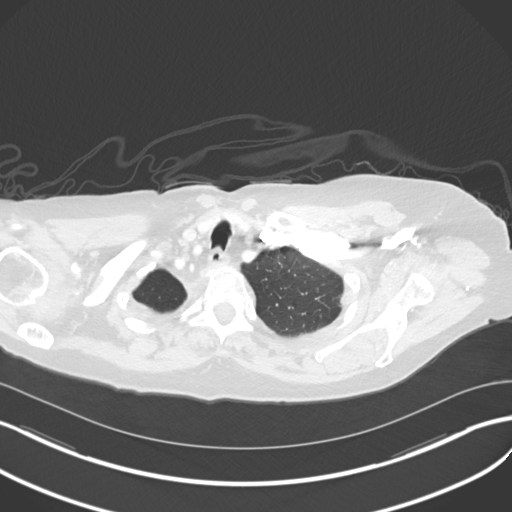
[im 154/166  lung]
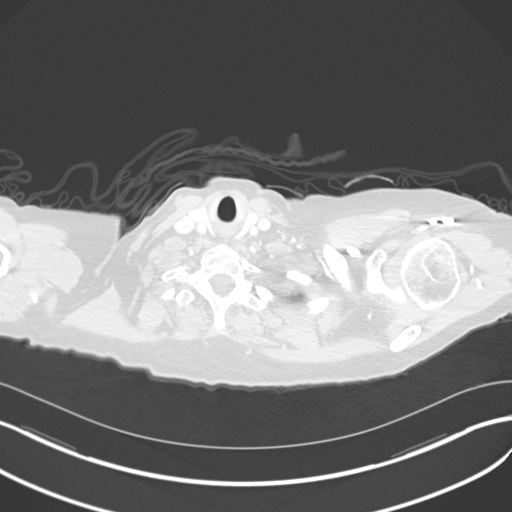

[Series 6: coronal · coronal · 0.67mm/px · 3 of 134 slices shown]
[im 27/134  lung]
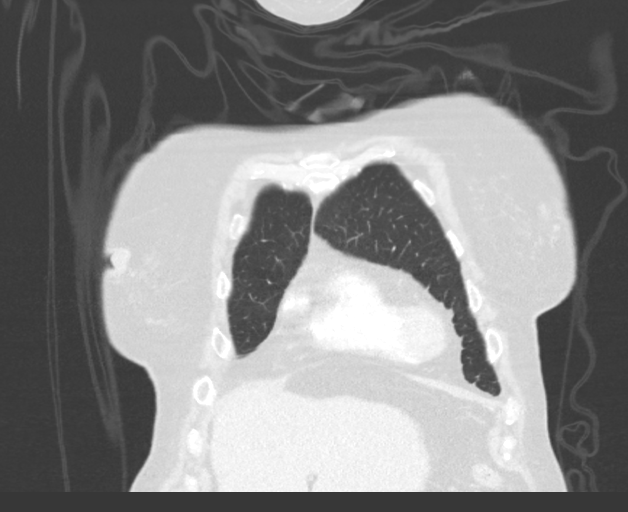
[im 54/134  lung]
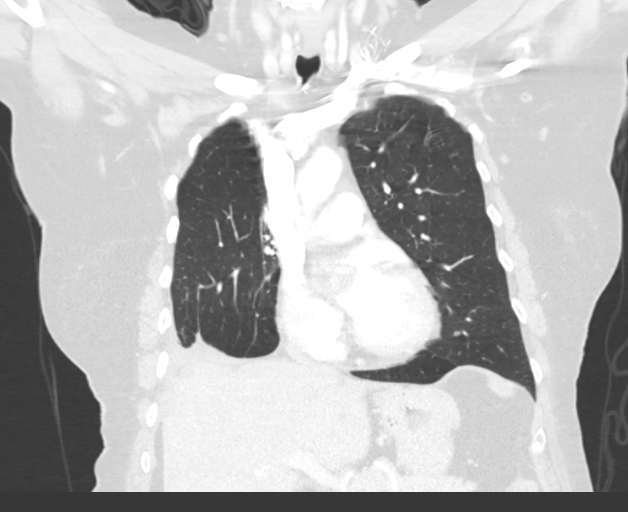
[im 80/134  lung]
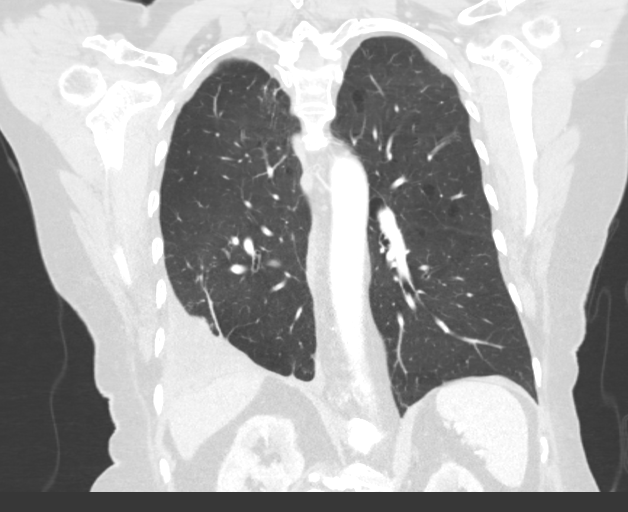

[15 of 36 positions shown; findings below may reference images not displayed]

FINDINGS: Cardiovascular: The heart size is normal. No pericardial effusion.
Coronary artery calcification is noted. Atherosclerotic
calcification is noted in the wall of the thoracic aorta.

Mediastinum/Nodes: No mediastinal lymphadenopathy. No evidence for
gross hilar lymphadenopathy although assessment is limited by the
lack of intravenous contrast on today's study. The esophagus has
normal imaging features. There is no axillary lymphadenopathy.

Lungs/Pleura: Volume loss right hemithorax compatible with prior
right upper lobectomy. Assessment of the upper lungs is limited by
respiratory motion artifact. Centrilobular emphysema noted.
Loculated right pleural fluid collection is new in the interval and
there is a 3.4 x 1.2 cm masslike area of lung consolidation
posteriorly on the right.Scattered tiny left lung nodules are
similar to prior.

Upper Abdomen: Cortical scarring noted in both kidneys.

Musculoskeletal: Bone windows reveal no worrisome lytic or sclerotic
osseous lesions.
IMPRESSION: 1. Prominent interval change with development of a posterior rim
enhancing and loculated right pleural fluid collection. Immediately
adjacent to this pleural collection is a new 3.4 x 1.2 cm focal area
of lung consolidation. As recurrent disease is now a concern, PET-CT
may prove helpful to further evaluate.
2. Stable appearance scattered tiny left lung nodules. Next item
insert
3.  Emphysema. (4VC9T-W3D.G)
4.  Aortic Atherosclerois (4VC9T-170.0)

## 2020-01-27 ENCOUNTER — Ambulatory Visit (HOSPITAL_COMMUNITY)
Admission: RE | Admit: 2020-01-27 | Discharge: 2020-01-27 | Disposition: A | Discharge status: Home / Self Care | Payer: Medicare Other | Source: Ambulatory Visit | Attending: Internal Medicine | Admitting: Internal Medicine

## 2020-01-27 ENCOUNTER — Other Ambulatory Visit: Payer: Self-pay

## 2020-01-27 ENCOUNTER — Encounter (HOSPITAL_COMMUNITY): Payer: Self-pay

## 2020-01-27 ENCOUNTER — Inpatient Hospital Stay: Payer: Medicare Other | Attending: Internal Medicine

## 2020-01-27 DIAGNOSIS — Z85118 Personal history of other malignant neoplasm of bronchus and lung: Secondary | ICD-10-CM | POA: Diagnosis not present

## 2020-01-27 DIAGNOSIS — Z90710 Acquired absence of both cervix and uterus: Secondary | ICD-10-CM | POA: Diagnosis not present

## 2020-01-27 DIAGNOSIS — C3490 Malignant neoplasm of unspecified part of unspecified bronchus or lung: Secondary | ICD-10-CM

## 2020-01-27 DIAGNOSIS — Z902 Acquired absence of lung [part of]: Secondary | ICD-10-CM | POA: Insufficient documentation

## 2020-01-27 LAB — CBC WITH DIFFERENTIAL (CANCER CENTER ONLY)
Abs Immature Granulocytes: 0.03 10*3/uL (ref 0.00–0.07)
Basophils Absolute: 0 10*3/uL (ref 0.0–0.1)
Basophils Relative: 0 %
Eosinophils Absolute: 0.1 10*3/uL (ref 0.0–0.5)
Eosinophils Relative: 1 %
HCT: 39.4 % (ref 36.0–46.0)
Hemoglobin: 13.7 g/dL (ref 12.0–15.0)
Immature Granulocytes: 0 %
Lymphocytes Relative: 26 %
Lymphs Abs: 2.1 10*3/uL (ref 0.7–4.0)
MCH: 34.1 pg — ABNORMAL HIGH (ref 26.0–34.0)
MCHC: 34.8 g/dL (ref 30.0–36.0)
MCV: 98 fL (ref 80.0–100.0)
Monocytes Absolute: 0.6 10*3/uL (ref 0.1–1.0)
Monocytes Relative: 8 %
Neutro Abs: 5.4 10*3/uL (ref 1.7–7.7)
Neutrophils Relative %: 65 %
Platelet Count: 223 10*3/uL (ref 150–400)
RBC: 4.02 MIL/uL (ref 3.87–5.11)
RDW: 12 % (ref 11.5–15.5)
WBC Count: 8.3 10*3/uL (ref 4.0–10.5)
nRBC: 0 % (ref 0.0–0.2)

## 2020-01-27 LAB — CMP (CANCER CENTER ONLY)
ALT: 17 U/L (ref 0–44)
AST: 24 U/L (ref 15–41)
Albumin: 4.5 g/dL (ref 3.5–5.0)
Alkaline Phosphatase: 96 U/L (ref 38–126)
Anion gap: 13 (ref 5–15)
BUN: 14 mg/dL (ref 8–23)
CO2: 24 mmol/L (ref 22–32)
Calcium: 9.7 mg/dL (ref 8.9–10.3)
Chloride: 94 mmol/L — ABNORMAL LOW (ref 98–111)
Creatinine: 1.17 mg/dL — ABNORMAL HIGH (ref 0.44–1.00)
GFR, Est AFR Am: 53 mL/min — ABNORMAL LOW (ref >60)
GFR, Estimated: 46 mL/min — ABNORMAL LOW (ref >60)
Glucose, Bld: 104 mg/dL — ABNORMAL HIGH (ref 70–99)
Potassium: 4.1 mmol/L (ref 3.5–5.1)
Sodium: 131 mmol/L — ABNORMAL LOW (ref 135–145)
Total Bilirubin: 0.6 mg/dL (ref 0.3–1.2)
Total Protein: 7.2 g/dL (ref 6.5–8.1)

## 2020-01-28 ENCOUNTER — Other Ambulatory Visit: Payer: Medicare Other

## 2020-01-30 ENCOUNTER — Encounter: Payer: Self-pay | Admitting: Internal Medicine

## 2020-01-30 ENCOUNTER — Inpatient Hospital Stay (HOSPITAL_BASED_OUTPATIENT_CLINIC_OR_DEPARTMENT_OTHER): Payer: Medicare Other | Admitting: Internal Medicine

## 2020-01-30 ENCOUNTER — Other Ambulatory Visit: Payer: Self-pay

## 2020-01-30 VITALS — BP 127/103 | HR 67 | Temp 97.9°F | Resp 18 | Ht 65.0 in | Wt 143.0 lb

## 2020-01-30 DIAGNOSIS — Z85118 Personal history of other malignant neoplasm of bronchus and lung: Secondary | ICD-10-CM | POA: Diagnosis not present

## 2020-01-30 DIAGNOSIS — C3411 Malignant neoplasm of upper lobe, right bronchus or lung: Secondary | ICD-10-CM

## 2020-01-30 NOTE — Progress Notes (Signed)
[facility] [phone]   Fax:(336) [phone]  OFFICE PROGRESS NOTE  [name], MD [address], [place]  [place] [address]  DIAGNOSIS: Cancer of upper lobe of lung  Primary site: Lung (Right)  Staging method: AJCC 7th Edition  Clinical: (T1a, N0)  Pathologic: Stage IA (T1a, N0, cM0) signed by [name], MD on 07/09/2013 1:58 PM  Summary: Stage IA (T1a, N0, cM0)  Lung cancer, Right upper lobe  Primary site: Lung (Right)  Pathologic: Stage IB (T2a, N0, cM0) signed by [name], MD on 07/09/2013 1:54 PM  Summary: Stage IB (T2a, N0, cM0)   PRIOR THERAPY:  1) Status post right video-assisted thoracoscopy, wedge resection, right upper lobe with completion of right upper lobectomy with lymph node dissection under the care of Dr. Servando Snare on 07/08/2013. 2) Adjuvant chemotherapy with systemic chemotherapy in the form of cisplatin at 75 mg per meter squared and Alimta 500 mg per meter squared given every 3 weeks for a total of 4 cycles, status post 4 cycles. Last cycle was given 10/23/2013.  CURRENT THERAPY: Observation.  INTERVAL HISTORY: [name] 74 y.o. female returns to the clinic today for follow-up visit accompanied by her friend.  The patient is feeling fine today with no concerning complaints.  Her husband died from Covid last year.  The patient denied having any current chest pain, shortness of breath, cough or hemoptysis.  She denied having any fever or chills.  She has no nausea, vomiting, diarrhea or constipation.  She has no headache or visual changes.  She is here today for evaluation with repeat CT scan of the chest for restaging of her disease.   MEDICAL HISTORY: Past Medical History:  Diagnosis Date  . Arthritis    back   . Cavitating mass in right upper lung lobe   . CKD (chronic kidney disease) stage 3, GFR 30-59 ml/min 03/12/2015  . Diverticulosis   . Dizziness    occasionally   . Family history of kidney  infection    pt history-saw Dr.Wrenn  . GERD (gastroesophageal reflux disease)    takes Nexium daily  . History of colon polyps   . Hyperlipidemia    takes Zocor daily  . Hypertension    takes Amlodipine daily and Chlorthalidone as well  . Lung cancer, Right upper lobe dx'd 07/2013  . Lung nodules    right upper lobe  . Nocturia   . Pleural effusion on right 04/05/2017    ALLERGIES:  has No Known Allergies.  MEDICATIONS:  Current Outpatient Medications  Medication Sig Dispense Refill  . acetaminophen (TYLENOL 8 HOUR) 650 MG CR tablet Take 650 mg by mouth every 8 (eight) hours as needed for pain.    . calcium carbonate (TUMS - DOSED IN MG ELEMENTAL CALCIUM) 500 MG chewable tablet Chew 1 tablet by mouth as needed for indigestion or heartburn.    . diltiazem (CARDIZEM CD) 240 MG 24 hr capsule Take 240 mg by mouth daily.    Marland Kitchen docusate sodium (COLACE) 100 MG capsule Take 1 capsule (100 mg total) by mouth 2 (two) times daily. 10 capsule 0  . esomeprazole (NEXIUM) 40 MG capsule Take 40 mg by mouth daily before breakfast.    . furosemide (LASIX) 20 MG tablet Take 1 tablet (20 mg total) by mouth daily. (Patient taking differently: Take 20 mg by mouth 3 (three) times a week. ) 15 tablet 0  . KLOR-CON 10 10 MEQ tablet Take  10 mEq by mouth 3 (three) times a week.   4  . LORazepam (ATIVAN) 1 MG tablet Take 1 mg by mouth daily.    Marland Kitchen losartan (COZAAR) 100 MG tablet Take 1 tablet (100 mg total) by mouth daily. 30 tablet 0  . metoprolol succinate (TOPROL-XL) 50 MG 24 hr tablet Take 50 mg by mouth daily. Take with or immediately following a meal.    . Multiple Vitamin (MULTIVITAMIN WITH MINERALS) TABS tablet Take 1 tablet by mouth daily.    Marland Kitchen oxymetazoline (AFRIN) 0.05 % nasal spray Place 1 spray into both nostrils as needed for congestion.    Marland Kitchen rOPINIRole (REQUIP) 0.25 MG tablet Take 0.5 mg by mouth at bedtime.     . simvastatin (ZOCOR) 20 MG tablet Take 20 mg by mouth every evening.     No current  facility-administered medications for this visit.    SURGICAL HISTORY:  Past Surgical History:  Procedure Laterality Date  . ABDOMINAL HYSTERECTOMY     at age 55;partial   . CARDIOVASCULAR STRESS TEST  06/29/2009   EF 79%, NO ISCHEMIA  . COLONOSCOPY    . IR THORACENTESIS ASP PLEURAL SPACE W/IMG GUIDE  04/25/2017  . ORIF TIBIA PLATEAU Left 09/22/2017   Procedure: OPEN REDUCTION INTERNAL FIXATION (ORIF) TIBIAL PLATEAU;  Surgeon: Shona Needles, MD;  Location: [facility];  Service: Orthopedics;  Laterality: Left;  . TRACHEOSTOMY     at age 62 or 5 d/t polyps on vocal cord  . VIDEO ASSISTED THORACOSCOPY (VATS)/WEDGE RESECTION Right 07/08/2013   Procedure: VIDEO ASSISTED THORACOSCOPY (VATS)/WEDGE RESECTION;  Surgeon: [name], MD;  Location: [facility];  Service: Thoracic;  Laterality: Right;  Marland Kitchen VIDEO BRONCHOSCOPY N/A 07/08/2013   Procedure: VIDEO BRONCHOSCOPY;  Surgeon: [name], MD;  Location: [facility];  Service: Thoracic;  Laterality: N/A;    REVIEW OF SYSTEMS:  A comprehensive review of systems was negative.   PHYSICAL EXAMINATION: General appearance: alert, cooperative and no distress Head: Normocephalic, without obvious abnormality, atraumatic Neck: no adenopathy, no JVD, supple, symmetrical, trachea midline and thyroid not enlarged, symmetric, no tenderness/mass/nodules Lymph nodes: Cervical, supraclavicular, and axillary nodes normal. Resp: clear to auscultation bilaterally Back: symmetric, no curvature. ROM normal. No CVA tenderness. Cardio: regular rate and rhythm, S1, S2 normal, no murmur, click, rub or gallop GI: soft, non-tender; bowel sounds normal; no masses,  no organomegaly Extremities: extremities normal, atraumatic, no cyanosis or edema  ECOG PERFORMANCE STATUS: 1 - Symptomatic but completely ambulatory  Blood pressure (!) 127/103, pulse 67, temperature 97.9 F (36.6 C), temperature source Temporal, resp. rate 18, height 5\' 5"  (1.651 m), weight 143 lb (64.9 kg),  SpO2 100 %.  LABORATORY DATA: Lab Results  Component Value Date   WBC 8.3 01/27/2020   HGB 13.7 01/27/2020   HCT 39.4 01/27/2020   MCV 98.0 01/27/2020   PLT 223 01/27/2020      Chemistry      Component Value Date/Time   NA 131 (L) 01/27/2020 1001   NA 130 (L) 07/06/2017 0832   K 4.1 01/27/2020 1001   K 4.3 07/06/2017 0832   CL 94 (L) 01/27/2020 1001   CO2 24 01/27/2020 1001   CO2 25 07/06/2017 0832   BUN 14 01/27/2020 1001   BUN 14.8 07/06/2017 0832   CREATININE 1.17 (H) 01/27/2020 1001   CREATININE 1.2 (H) 07/06/2017 0832      Component Value Date/Time   CALCIUM 9.7 01/27/2020 1001   CALCIUM 9.5 07/06/2017 0832  ALKPHOS 96 01/27/2020 1001   ALKPHOS 136 07/06/2017 0832   AST 24 01/27/2020 1001   AST 23 07/06/2017 0832   ALT 17 01/27/2020 1001   ALT 14 07/06/2017 0832   BILITOT 0.6 01/27/2020 1001   BILITOT 0.51 07/06/2017 0832       RADIOGRAPHIC STUDIES: CT Chest Wo Contrast  Result Date: 01/27/2020 CLINICAL DATA:  Non-small cell lung cancer, chemotherapy complete. EXAM: CT CHEST WITHOUT CONTRAST TECHNIQUE: Multidetector CT imaging of the chest was performed following the standard protocol without IV contrast. COMPARISON:  01/22/2019. FINDINGS: Cardiovascular: Atherosclerotic calcification of the aorta and coronary arteries. Heart size normal. No pericardial effusion. Mediastinum/Nodes: No pathologically enlarged mediastinal or axillary lymph nodes. Hilar regions are difficult to evaluate without IV contrast. Esophagus is grossly unremarkable. Lungs/Pleura: Centrilobular emphysema. Right upper lobectomy. Rounded atelectasis and adjacent pleural thickening in the posterior aspect of the right lower lobe. Small nodules in the left lower lobe measure up to 5 mm (7/98), unchanged. No pleural fluid. Airway is otherwise unremarkable. Upper Abdomen: Liver margin is irregular. Visualized portions of the liver, adrenal glands, kidneys, spleen, pancreas, stomach and bowel are  grossly unremarkable. No upper abdominal adenopathy. Musculoskeletal: Degenerative changes and scoliosis in the spine. Lower thoracic compression deformities are unchanged. IMPRESSION: 1. Postoperative scarring in the right lung. No evidence of recurrent or metastatic disease. 2. Small pulmonary nodules, stable. 3. Cirrhosis. 4. Aortic atherosclerosis (ICD10-I70.0). Coronary artery calcification. 5.  Emphysema (ICD10-J43.9). Electronically Signed   By: Lorin Picket M.D.   On: 01/27/2020 13:57   ASSESSMENT AND PLAN: This is a very pleasant 74 years old white female with synchronous non-small cell lung cancer presenting with stage IA as well as a stage IB status post right upper lobectomy with lymph node dissection and currently undergoing adjuvant chemotherapy with cisplatin and Alimta status post 4 cycles completed in March 2015. The patient has been on observation for more than 6 years now. She had repeat CT scan of the chest performed recently.  I personally and independently reviewed the scans and discussed the results with the patient and her friend today. Her scan showed no concerning findings for disease recurrence or metastasis. I recommended for the patient to continue on observation with routine follow-up visit by her primary care physician from now on.  I will be happy to see her in the future if she has any concerning findings. She was advised to call immediately if she has any concerning symptoms. The patient voices understanding of current disease status and treatment options and is in agreement with the current care plan. All questions were answered. The patient knows to call the clinic with any problems, questions or concerns. We can certainly see the patient much sooner if necessary.  Disclaimer: This note was dictated with voice recognition software. Similar sounding words can inadvertently be transcribed and may not be corrected upon review.

## 2021-07-13 ENCOUNTER — Other Ambulatory Visit: Payer: Self-pay | Admitting: Internal Medicine

## 2021-07-13 ENCOUNTER — Ambulatory Visit
Admission: RE | Admit: 2021-07-13 | Discharge: 2021-07-13 | Disposition: A | Discharge status: Home / Self Care | Payer: Medicare Other | Source: Ambulatory Visit | Attending: Internal Medicine | Admitting: Internal Medicine

## 2021-07-13 DIAGNOSIS — R059 Cough, unspecified: Secondary | ICD-10-CM

## 2021-07-13 DIAGNOSIS — R062 Wheezing: Secondary | ICD-10-CM

## 2022-04-04 ENCOUNTER — Other Ambulatory Visit: Payer: Self-pay | Admitting: Internal Medicine

## 2022-04-04 ENCOUNTER — Telehealth: Payer: Self-pay

## 2022-04-04 DIAGNOSIS — C3490 Malignant neoplasm of unspecified part of unspecified bronchus or lung: Secondary | ICD-10-CM

## 2022-04-04 NOTE — Telephone Encounter (Signed)
Pt requests to have a CT scan and an office visit.  Pts son-in-law LVM requesting to schedule an appt.  Pt was last seen in 01/2020 and released back to primary from more than 6 years of observation.  Pt c/o of: Left upper back pain >50mo. Describes as an ache. 2. Stomach discomfort (stopped Nexium) 3. Under arm pain on both sides. Describes as "sore".  Son-in-law requests he be contacted for appts at [phone].

## 2022-04-05 ENCOUNTER — Telehealth: Payer: Self-pay | Admitting: Internal Medicine

## 2022-04-05 ENCOUNTER — Telehealth: Payer: Self-pay

## 2022-04-05 NOTE — Telephone Encounter (Signed)
Contacted patient to scheduled appointments. Patient is aware of appointments that are scheduled.   

## 2022-04-05 NOTE — Telephone Encounter (Signed)
I spoke with pt and advised of the contact number to RAD scheduling for her CT scan.

## 2022-04-25 ENCOUNTER — Other Ambulatory Visit: Payer: Medicare Other

## 2022-04-26 ENCOUNTER — Ambulatory Visit: Payer: Medicare Other | Admitting: Internal Medicine

## 2022-05-02 ENCOUNTER — Ambulatory Visit (HOSPITAL_COMMUNITY)
Admission: RE | Admit: 2022-05-02 | Discharge: 2022-05-02 | Disposition: A | Discharge status: Home / Self Care | Payer: Medicare Other | Source: Ambulatory Visit | Attending: Internal Medicine | Admitting: Internal Medicine

## 2022-05-02 ENCOUNTER — Other Ambulatory Visit: Payer: Self-pay

## 2022-05-02 ENCOUNTER — Other Ambulatory Visit: Payer: Medicare Other

## 2022-05-02 ENCOUNTER — Inpatient Hospital Stay: Payer: Medicare Other | Attending: Internal Medicine

## 2022-05-02 DIAGNOSIS — C3490 Malignant neoplasm of unspecified part of unspecified bronchus or lung: Secondary | ICD-10-CM

## 2022-05-02 DIAGNOSIS — Z902 Acquired absence of lung [part of]: Secondary | ICD-10-CM | POA: Diagnosis not present

## 2022-05-02 DIAGNOSIS — Z85118 Personal history of other malignant neoplasm of bronchus and lung: Secondary | ICD-10-CM | POA: Insufficient documentation

## 2022-05-02 LAB — CMP (CANCER CENTER ONLY)
ALT: 14 U/L (ref 0–44)
AST: 24 U/L (ref 15–41)
Albumin: 4.7 g/dL (ref 3.5–5.0)
Alkaline Phosphatase: 65 U/L (ref 38–126)
Anion gap: 8 (ref 5–15)
BUN: 14 mg/dL (ref 8–23)
CO2: 29 mmol/L (ref 22–32)
Calcium: 9.4 mg/dL (ref 8.9–10.3)
Chloride: 97 mmol/L — ABNORMAL LOW (ref 98–111)
Creatinine: 1.19 mg/dL — ABNORMAL HIGH (ref 0.44–1.00)
GFR, Estimated: 47 mL/min — ABNORMAL LOW (ref >60)
Glucose, Bld: 119 mg/dL — ABNORMAL HIGH (ref 70–99)
Potassium: 3.8 mmol/L (ref 3.5–5.1)
Sodium: 134 mmol/L — ABNORMAL LOW (ref 135–145)
Total Bilirubin: 1.1 mg/dL (ref 0.3–1.2)
Total Protein: 7.3 g/dL (ref 6.5–8.1)

## 2022-05-02 LAB — CBC WITH DIFFERENTIAL (CANCER CENTER ONLY)
Abs Immature Granulocytes: 0.01 10*3/uL (ref 0.00–0.07)
Basophils Absolute: 0 10*3/uL (ref 0.0–0.1)
Basophils Relative: 1 %
Eosinophils Absolute: 0.1 10*3/uL (ref 0.0–0.5)
Eosinophils Relative: 1 %
HCT: 41 % (ref 36.0–46.0)
Hemoglobin: 14.3 g/dL (ref 12.0–15.0)
Immature Granulocytes: 0 %
Lymphocytes Relative: 37 %
Lymphs Abs: 2.5 10*3/uL (ref 0.7–4.0)
MCH: 33.2 pg (ref 26.0–34.0)
MCHC: 34.9 g/dL (ref 30.0–36.0)
MCV: 95.1 fL (ref 80.0–100.0)
Monocytes Absolute: 0.5 10*3/uL (ref 0.1–1.0)
Monocytes Relative: 7 %
Neutro Abs: 3.6 10*3/uL (ref 1.7–7.7)
Neutrophils Relative %: 54 %
Platelet Count: 215 10*3/uL (ref 150–400)
RBC: 4.31 MIL/uL (ref 3.87–5.11)
RDW: 12.6 % (ref 11.5–15.5)
WBC Count: 6.7 10*3/uL (ref 4.0–10.5)
nRBC: 0 % (ref 0.0–0.2)

## 2022-05-02 MED ORDER — IOHEXOL 300 MG/ML  SOLN
100.0000 mL | Freq: Once | INTRAMUSCULAR | Status: AC | PRN
  Administered 2022-05-02: 100 mL via INTRAVENOUS

## 2022-05-03 ENCOUNTER — Inpatient Hospital Stay (HOSPITAL_BASED_OUTPATIENT_CLINIC_OR_DEPARTMENT_OTHER): Payer: Medicare Other | Admitting: Internal Medicine

## 2022-05-03 ENCOUNTER — Encounter: Payer: Self-pay | Admitting: Internal Medicine

## 2022-05-03 VITALS — BP 165/64 | HR 60 | Temp 98.5°F | Resp 15 | Wt 141.3 lb

## 2022-05-03 DIAGNOSIS — C3410 Malignant neoplasm of upper lobe, unspecified bronchus or lung: Secondary | ICD-10-CM | POA: Diagnosis not present

## 2022-05-03 DIAGNOSIS — Z85118 Personal history of other malignant neoplasm of bronchus and lung: Secondary | ICD-10-CM | POA: Diagnosis not present

## 2022-05-03 NOTE — Progress Notes (Signed)
[facility] [phone]) [phone]   Fax:([phone]  OFFICE PROGRESS NOTE  [name], MD [address] [place] [address]  DIAGNOSIS: Cancer of upper lobe of lung  Primary site: Lung (Right)  Staging method: AJCC 7th Edition  Clinical: (T1a, N0)  Pathologic: Stage IA (T1a, N0, cM0) signed by [name], MD on 07/09/2013 1:58 PM  Summary: Stage IA (T1a, N0, cM0)  Lung cancer, Right upper lobe  Primary site: Lung (Right)  Pathologic: Stage IB (T2a, N0, cM0) signed by [name], MD on 07/09/2013 1:54 PM  Summary: Stage IB (T2a, N0, cM0)   PRIOR THERAPY:  1) Status post right video-assisted thoracoscopy, wedge resection, right upper lobe with completion of right upper lobectomy with lymph node dissection under the care of Dr. Servando Snare on 07/08/2013. 2) Adjuvant chemotherapy with systemic chemotherapy in the form of cisplatin at 75 mg per meter squared and Alimta 500 mg per meter squared given every 3 weeks for a total of 4 cycles, status post 4 cycles. Last cycle was given 10/23/2013.  CURRENT THERAPY: Observation.  INTERVAL HISTORY: [name] 76 y.o. female returns to the clinic today for follow-up visit accompanied by her daughter Thekla.  The patient is feeling fine today with no concerning complaints.  She has been on observation but recently she was concerned about cancer recurrence and she requested a follow-up visit and evaluation.  She denied having any current chest pain, shortness of breath, cough or hemoptysis.  She denied having any nausea, vomiting, diarrhea or constipation.  She has no headache or visual changes.  She denied having any weight loss or night sweats.  She had repeat CT scan of the chest performed recently and she is here for evaluation and discussion of her scan results.   MEDICAL HISTORY: Past Medical History:  Diagnosis Date   Arthritis    back    Cavitating mass in right upper lung lobe    CKD  (chronic kidney disease) stage 3, GFR 30-59 ml/min 03/12/2015   Diverticulosis    Dizziness    occasionally    Family history of kidney infection    pt history-saw Dr.Wrenn   GERD (gastroesophageal reflux disease)    takes Nexium daily   History of colon polyps    Hyperlipidemia    takes Zocor daily   Hypertension    takes Amlodipine daily and Chlorthalidone as well   Lung cancer, Right upper lobe dx'd 07/2013   Lung nodules    right upper lobe   Nocturia    Pleural effusion on right 04/05/2017    ALLERGIES:  has No Known Allergies.  MEDICATIONS:  Current Outpatient Medications  Medication Sig Dispense Refill   acetaminophen (TYLENOL 8 HOUR) 650 MG CR tablet Take 650 mg by mouth every 8 (eight) hours as needed for pain.     alendronate (FOSAMAX) 70 MG tablet Take 70 mg by mouth once a week.     diltiazem (CARDIZEM CD) 240 MG 24 hr capsule Take 240 mg by mouth daily.     docusate sodium (COLACE) 100 MG capsule Take 1 capsule (100 mg total) by mouth 2 (two) times daily. 10 capsule 0   esomeprazole (NEXIUM) 40 MG capsule Take 40 mg by mouth daily before breakfast.     furosemide (LASIX) 20 MG tablet Take 1 tablet (20 mg total) by mouth daily. (Patient taking differently: Take 20 mg by mouth 3 (three) times a week. ) 15  tablet 0   KLOR-CON 10 10 MEQ tablet Take 10 mEq by mouth 3 (three) times a week.   4   LORazepam (ATIVAN) 1 MG tablet Take 1 mg by mouth daily.     losartan (COZAAR) 100 MG tablet Take 1 tablet (100 mg total) by mouth daily. 30 tablet 0   metoprolol succinate (TOPROL-XL) 50 MG 24 hr tablet Take 50 mg by mouth daily. Take with or immediately following a meal.     Multiple Vitamin (MULTIVITAMIN WITH MINERALS) TABS tablet Take 1 tablet by mouth daily. (Patient not taking: Reported on 01/30/2020)     rOPINIRole (REQUIP) 0.25 MG tablet Take 0.5 mg by mouth at bedtime.      simvastatin (ZOCOR) 20 MG tablet Take 20 mg by mouth every evening.     No current  facility-administered medications for this visit.    SURGICAL HISTORY:  Past Surgical History:  Procedure Laterality Date   ABDOMINAL HYSTERECTOMY     at age 32;partial    CARDIOVASCULAR STRESS TEST  06/29/2009   EF 79%, NO ISCHEMIA   COLONOSCOPY     IR THORACENTESIS ASP PLEURAL SPACE W/IMG GUIDE  04/25/2017   ORIF TIBIA PLATEAU Left 09/22/2017   Procedure: OPEN REDUCTION INTERNAL FIXATION (ORIF) TIBIAL PLATEAU;  Surgeon: Shona Needles, MD;  Location: [facility];  Service: Orthopedics;  Laterality: Left;   TRACHEOSTOMY     at age 58 or 5 d/t polyps on vocal cord   VIDEO ASSISTED THORACOSCOPY (VATS)/WEDGE RESECTION Right 07/08/2013   Procedure: VIDEO ASSISTED THORACOSCOPY (VATS)/WEDGE RESECTION;  Surgeon: [name], MD;  Location: [facility];  Service: Thoracic;  Laterality: Right;   VIDEO BRONCHOSCOPY N/A 07/08/2013   Procedure: VIDEO BRONCHOSCOPY;  Surgeon: [name], MD;  Location: [facility] OR;  Service: Thoracic;  Laterality: N/A;    REVIEW OF SYSTEMS:  A comprehensive review of systems was negative.   PHYSICAL EXAMINATION: General appearance: alert, cooperative, and no distress Head: Normocephalic, without obvious abnormality, atraumatic Neck: no adenopathy, no JVD, supple, symmetrical, trachea midline, and thyroid not enlarged, symmetric, no tenderness/mass/nodules Lymph nodes: Cervical, supraclavicular, and axillary nodes normal. Resp: clear to auscultation bilaterally Back: symmetric, no curvature. ROM normal. No CVA tenderness. Cardio: regular rate and rhythm, S1, S2 normal, no murmur, click, rub or gallop GI: soft, non-tender; bowel sounds normal; no masses,  no organomegaly Extremities: extremities normal, atraumatic, no cyanosis or edema  ECOG PERFORMANCE STATUS: 1 - Symptomatic but completely ambulatory  Blood pressure (!) 165/64, pulse 60, temperature 98.5 F (36.9 C), temperature source Oral, resp. rate 15, weight 141 lb 4.8 oz (64.1 kg), SpO2 100 %.  LABORATORY  DATA: Lab Results  Component Value Date   WBC 6.7 05/02/2022   HGB 14.3 05/02/2022   HCT 41.0 05/02/2022   MCV 95.1 05/02/2022   PLT 215 05/02/2022      Chemistry      Component Value Date/Time   NA 134 (L) 05/02/2022 1019   NA 130 (L) 07/06/2017 0832   K 3.8 05/02/2022 1019   K 4.3 07/06/2017 0832   CL 97 (L) 05/02/2022 1019   CO2 29 05/02/2022 1019   CO2 25 07/06/2017 0832   BUN 14 05/02/2022 1019   BUN 14.8 07/06/2017 0832   CREATININE 1.19 (H) 05/02/2022 1019   CREATININE 1.2 (H) 07/06/2017 0832      Component Value Date/Time   CALCIUM 9.4 05/02/2022 1019   CALCIUM 9.5 07/06/2017 0832   ALKPHOS 65 05/02/2022 1019   ALKPHOS  136 07/06/2017 0832   AST 24 05/02/2022 1019   AST 23 07/06/2017 0832   ALT 14 05/02/2022 1019   ALT 14 07/06/2017 0832   BILITOT 1.1 05/02/2022 1019   BILITOT 0.51 07/06/2017 0832       RADIOGRAPHIC STUDIES: CT Chest W Contrast  Result Date: 05/02/2022 CLINICAL DATA:  Non-small cell lung cancer staging EXAM: CT CHEST WITH CONTRAST TECHNIQUE: Multidetector CT imaging of the chest was performed during intravenous contrast administration. RADIATION DOSE REDUCTION: This exam was performed according to the departmental dose-optimization program which includes automated exposure control, adjustment of the mA and/or kV according to patient size and/or use of iterative reconstruction technique. CONTRAST:  167mL OMNIPAQUE IOHEXOL 300 MG/ML  SOLN COMPARISON:  Chest CT dated January 27, 2020 FINDINGS: Cardiovascular: Normal heart size. No pericardial effusion. Normal caliber thoracic aorta with mild atherosclerotic disease. Mediastinum/Nodes: Patulous esophagus. No pathologically enlarged lymph nodes seen in the chest. Lungs/Pleura: Prior right upper lobectomy. Remaining central airways are patent. Moderate centrilobular emphysema. Chronic trace right pleural effusion with adjacent round atelectasis, unchanged when compared with prior exam. Stable small left lower  lobe pulmonary nodules measuring 5 mm on series 7, image 111 and 3 mm on 102. Upper Abdomen: Cirrhotic liver morphology and right renal scarring. No acute abnormality. Musculoskeletal: Multilevel compression deformities of the lower thoracic spine, unchanged when compared with prior exam. Degenerative changes and scoliosis. No aggressive appearing osseous lesions. IMPRESSION: 1. Prior right upper lobectomy. No evidence of progressive or metastatic disease. 2. Stable small solid pulmonary nodules. 3. Aortic Atherosclerosis (ICD10-I70.0) and Emphysema (ICD10-J43.9). Electronically Signed   By: Yetta Glassman M.D.   On: 05/02/2022 22:21    ASSESSMENT AND PLAN: This is a very pleasant 76 years old white female with synchronous non-small cell lung cancer presenting with stage IA as well as a stage IB status post right upper lobectomy with lymph node dissection and currently undergoing adjuvant chemotherapy with cisplatin and Alimta status post 4 cycles completed in March 2015. The patient has been on observation since that time and she has been doing fine with no concerning complaints. She had repeat CT scan of the chest performed recently.  I discussed the scan results with the patient and her daughter and there is no concerning findings for disease recurrence or metastasis. I recommended for her to continue on observation with repeat imaging studies and follow-up visit on as-needed basis. She was advised to call immediately if she has any other concerning symptoms in the interval. The patient voices understanding of current disease status and treatment options and is in agreement with the current care plan. All questions were answered. The patient knows to call the clinic with any problems, questions or concerns. We can certainly see the patient much sooner if necessary.  Disclaimer: This note was dictated with voice recognition software. Similar sounding words can inadvertently be transcribed and may not  be corrected upon review.

## 2024-03-08 ENCOUNTER — Emergency Department (HOSPITAL_COMMUNITY)

## 2024-03-08 ENCOUNTER — Observation Stay (HOSPITAL_COMMUNITY): Admission: EM | Admit: 2024-03-08 | Discharge: 2024-03-10 | Disposition: A | Discharge status: Home / Self Care

## 2024-03-08 DIAGNOSIS — I129 Hypertensive chronic kidney disease with stage 1 through stage 4 chronic kidney disease, or unspecified chronic kidney disease: Secondary | ICD-10-CM | POA: Insufficient documentation

## 2024-03-08 DIAGNOSIS — I440 Atrioventricular block, first degree: Secondary | ICD-10-CM | POA: Diagnosis not present

## 2024-03-08 DIAGNOSIS — N182 Chronic kidney disease, stage 2 (mild): Secondary | ICD-10-CM | POA: Diagnosis not present

## 2024-03-08 DIAGNOSIS — E785 Hyperlipidemia, unspecified: Secondary | ICD-10-CM | POA: Diagnosis not present

## 2024-03-08 DIAGNOSIS — D631 Anemia in chronic kidney disease: Secondary | ICD-10-CM | POA: Insufficient documentation

## 2024-03-08 DIAGNOSIS — R55 Syncope and collapse: Secondary | ICD-10-CM | POA: Diagnosis present

## 2024-03-08 DIAGNOSIS — I10 Essential (primary) hypertension: Secondary | ICD-10-CM | POA: Diagnosis present

## 2024-03-08 DIAGNOSIS — Z85118 Personal history of other malignant neoplasm of bronchus and lung: Secondary | ICD-10-CM | POA: Diagnosis not present

## 2024-03-08 DIAGNOSIS — E871 Hypo-osmolality and hyponatremia: Secondary | ICD-10-CM | POA: Diagnosis present

## 2024-03-08 DIAGNOSIS — D638 Anemia in other chronic diseases classified elsewhere: Secondary | ICD-10-CM | POA: Insufficient documentation

## 2024-03-08 LAB — COMPREHENSIVE METABOLIC PANEL WITH GFR
ALT: 13 U/L (ref 0–44)
AST: 31 U/L (ref 15–41)
Albumin: 4 g/dL (ref 3.5–5.0)
Alkaline Phosphatase: 77 U/L (ref 38–126)
Anion gap: 11 (ref 5–15)
BUN: 17 mg/dL (ref 8–23)
CO2: 20 mmol/L — ABNORMAL LOW (ref 22–32)
Calcium: 8.8 mg/dL — ABNORMAL LOW (ref 8.9–10.3)
Chloride: 95 mmol/L — ABNORMAL LOW (ref 98–111)
Creatinine, Ser: 1.38 mg/dL — ABNORMAL HIGH (ref 0.44–1.00)
GFR, Estimated: 39 mL/min — ABNORMAL LOW (ref >60)
Glucose, Bld: 103 mg/dL — ABNORMAL HIGH (ref 70–99)
Potassium: 3.9 mmol/L (ref 3.5–5.1)
Sodium: 126 mmol/L — ABNORMAL LOW (ref 135–145)
Total Bilirubin: 0.8 mg/dL (ref 0.0–1.2)
Total Protein: 5.9 g/dL — ABNORMAL LOW (ref 6.5–8.1)

## 2024-03-08 LAB — CBC WITH DIFFERENTIAL/PLATELET
Abs Granulocyte: 3.9 K/uL (ref 1.5–6.5)
Abs Immature Granulocytes: 0.03 K/uL (ref 0.00–0.07)
Basophils Absolute: 0 K/uL (ref 0.0–0.1)
Basophils Relative: 0 %
Eosinophils Absolute: 0.1 K/uL (ref 0.0–0.5)
Eosinophils Relative: 1 %
HCT: 36.5 % (ref 36.0–46.0)
Hemoglobin: 12.5 g/dL (ref 12.0–15.0)
Immature Granulocytes: 0 %
Lymphocytes Relative: 31 %
Lymphs Abs: 2.1 K/uL (ref 0.7–4.0)
MCH: 34.3 pg — ABNORMAL HIGH (ref 26.0–34.0)
MCHC: 34.2 g/dL (ref 30.0–36.0)
MCV: 100.3 fL — ABNORMAL HIGH (ref 80.0–100.0)
Monocytes Absolute: 0.6 K/uL (ref 0.1–1.0)
Monocytes Relative: 10 %
Neutro Abs: 3.9 K/uL (ref 1.7–7.7)
Neutrophils Relative %: 58 %
Platelets: 209 K/uL (ref 150–400)
RBC: 3.64 MIL/uL — ABNORMAL LOW (ref 3.87–5.11)
RDW: 12 % (ref 11.5–15.5)
WBC: 6.8 K/uL (ref 4.0–10.5)
nRBC: 0 % (ref 0.0–0.2)

## 2024-03-08 LAB — PROTIME-INR
INR: 0.9 (ref 0.8–1.2)
Prothrombin Time: 12.8 s (ref 11.4–15.2)

## 2024-03-08 LAB — ETHANOL: Alcohol, Ethyl (B): 96 mg/dL — ABNORMAL HIGH (ref <15)

## 2024-03-08 NOTE — ED Triage Notes (Signed)
 Pt BIBA coming in to be seen for a syncopal episode at her clubhouse. PT was seen by her friends to be out of it for about a minute. Pt has been A&O x 4 with EMS. PT has a 20G in the LAC. No interventions given. PT has been bradycardic with her heart rate ranging from 45-53. PT BGL with EMS was 113

## 2024-03-08 NOTE — ED Provider Notes (Signed)
 [place] EMERGENCY DEPARTMENT AT [facility] Provider Note   CSN: [id number] Arrival date & time: 03/08/24  2159     Patient presents with: Loss of Consciousness   [name] is a 78 y.o. female who presents today for syncopal episode.  Patient states that she was out with her friends at a bar, had had dinner and had been drinking some alcohol although less than her typical, patient not clinically intoxicated, when she began to feel lightheaded.  She asked for some water in the last thing she members is taking sips of water.  She subsequently was caught while she passed out, did not hit the floor.  She woke up sitting in a chair, EMS arrived and brought her to the emergency department.  Heart rate with EMS tween 45 and 53 bpm, no history of bradycardia.  CBG was normal, patient has been ANO x 4 since she woke up.  No chest pain shortness of breath or palpitations though she does endorse that she has felt much more fatigued for the last few days than is typical for her at baseline.  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  Of note patient was evaluated in the inpatient setting for recurrent syncope in 2018, similar stories to tonight's episode.  It was recommended that time to have a loop recorder placed, to rule out arrhythmia as etiology, which she declined.  I do not see where patient is ever followed up with electrophysiology in our system.  Patient reports she does not follow-up with cardiology at this time.   HPI     Prior to Admission medications   Medication Sig Start Date End Date Taking? Authorizing Provider  alendronate (FOSAMAX) 70 MG tablet Take 70 mg by mouth once a week. 01/10/20  Yes [provider]  diltiazem (CARDIZEM CD) 240 MG 24 hr capsule Take 240 mg by mouth daily. 09/11/17  Yes [provider]  furosemide  (LASIX ) 20 MG tablet Take 1  tablet (20 mg total) by mouth daily. Patient taking differently: Take 20 mg by mouth every Monday, Wednesday, and Friday. 05/13/16  Yes Hongalgi, Trenda BIRCH, MD  KLOR-CON  10 10 MEQ tablet Take 10 mEq by mouth daily. 06/25/15  Yes [provider]  losartan  (COZAAR ) 100 MG tablet Take 1 tablet (100 mg total) by mouth daily. 09/27/17  Yes Shahmehdi, Adriana LABOR, MD  metoprolol  succinate (TOPROL -XL) 100 MG 24 hr tablet Take 100 mg by mouth daily. 02/06/24  Yes [provider]  Multiple Vitamin (MULTIVITAMIN WITH MINERALS) TABS tablet Take 1 tablet by mouth daily. 05/14/16  Yes Hongalgi, Anand D, MD  Multiple Vitamins-Minerals (PRESERVISION AREDS 2 PO) Take 1 capsule by mouth in the morning and at bedtime.   Yes [provider]  rosuvastatin  (CRESTOR ) 5 MG tablet Take 5 mg by mouth every evening. 02/06/24  Yes [provider]    Allergies: Patient has no known allergies.    Review of Systems  Constitutional:  Positive for fatigue.  Neurological:  Positive for syncope and light-headedness.    Updated Vital Signs BP (!) 154/62   Pulse (!) 47   Temp 97.7 F (36.5 C) (Oral)   Resp 11   Ht 5' 6 (1.676 m)   Wt 64.4 kg   SpO2 100%   BMI 22.92 kg/m   Physical Exam Vitals and nursing note reviewed.  Constitutional:  Appearance: She is not ill-appearing or toxic-appearing.  HENT:     Head: Normocephalic and atraumatic.     Mouth/Throat:     Mouth: Mucous membranes are moist.     Pharynx: No oropharyngeal exudate or posterior oropharyngeal erythema.  Eyes:     General:        Right eye: No discharge.        Left eye: No discharge.     Conjunctiva/sclera: Conjunctivae normal.  Cardiovascular:     Rate and Rhythm: Regular rhythm. Bradycardia present.     Pulses: Normal pulses.     Heart sounds: Normal heart sounds. No murmur heard. Pulmonary:     Effort: Pulmonary effort is normal. No respiratory distress.     Breath sounds: Normal breath sounds. No wheezing  or rales.  Abdominal:     General: Bowel sounds are normal. There is no distension.     Palpations: Abdomen is soft.     Tenderness: There is no abdominal tenderness. There is no guarding or rebound.  Musculoskeletal:        General: No deformity.     Cervical back: Neck supple.     Right lower leg: 1+ Edema present.     Left lower leg: 1+ Edema present.  Skin:    General: Skin is warm and dry.     Capillary Refill: Capillary refill takes less than 2 seconds.  Neurological:     General: No focal deficit present.     Mental Status: She is alert and oriented to person, place, and time. Mental status is at baseline.     GCS: GCS eye subscore is 4. GCS verbal subscore is 5. GCS motor subscore is 6.  Psychiatric:        Mood and Affect: Mood normal.     (all labs ordered are listed, but only abnormal results are displayed) Labs Reviewed  CBC WITH DIFFERENTIAL/PLATELET - Abnormal; Notable for the following components:      Result Value   RBC 3.64 (*)    MCV 100.3 (*)    MCH 34.3 (*)    All other components within normal limits  COMPREHENSIVE METABOLIC PANEL WITH GFR - Abnormal; Notable for the following components:   Sodium 126 (*)    Chloride 95 (*)    CO2 20 (*)    Glucose, Bld 103 (*)    Creatinine, Ser 1.38 (*)    Calcium  8.8 (*)    Total Protein 5.9 (*)    GFR, Estimated 39 (*)    All other components within normal limits  ETHANOL - Abnormal; Notable for the following components:   Alcohol, Ethyl (B) 96 (*)    All other components within normal limits  PROTIME-INR  BASIC METABOLIC PANEL WITH GFR  BASIC METABOLIC PANEL WITH GFR  BASIC METABOLIC PANEL WITH GFR  BASIC METABOLIC PANEL WITH GFR  OSMOLALITY  SODIUM, URINE, RANDOM  OSMOLALITY, URINE  CBG MONITORING, ED  TROPONIN I (HIGH SENSITIVITY)  TROPONIN I (HIGH SENSITIVITY)    EKG: None  Radiology: DG Chest Port 1 View Result Date: 03/08/2024 EXAM: 1 VIEW XRAY OF THE CHEST 03/08/2024 10:30:00 PM COMPARISON:  07/13/2021, unchanged. Right costophrenic angle blunting. CLINICAL HISTORY: Syncope. FINDINGS: LUNGS AND PLEURA: No focal pulmonary opacity. No pulmonary edema. No pleural effusion. No pneumothorax. Right costophrenic angle blunting. HEART AND MEDIASTINUM: No acute abnormality of the cardiac and mediastinal silhouettes. BONES AND SOFT TISSUES: No acute osseous abnormality. IMPRESSION: 1. No acute findings. 2. Unchanged right costophrenic  angle blunting. Electronically signed by: Franky Stanford MD 03/08/2024 10:35 PM EDT RP Workstation: [id number]     Procedures   Medications Ordered in the ED  0.9 %  sodium chloride  infusion (has no administration in time range)  rosuvastatin  (CRESTOR ) tablet 5 mg (has no administration in time range)  heparin  injection 5,000 Units (has no administration in time range)  sodium chloride  flush (NS) 0.9 % injection 3 mL (has no administration in time range)  sodium chloride  flush (NS) 0.9 % injection 3 mL (has no administration in time range)  sodium chloride  flush (NS) 0.9 % injection 3 mL (has no administration in time range)  0.9 %  sodium chloride  infusion (has no administration in time range)  acetaminophen  (TYLENOL ) tablet 650 mg (has no administration in time range)    Or  acetaminophen  (TYLENOL ) suppository 650 mg (has no administration in time range)  ondansetron  (ZOFRAN ) tablet 4 mg (has no administration in time range)    Or  ondansetron  (ZOFRAN ) injection 4 mg (has no administration in time range)  hydrALAZINE  (APRESOLINE ) injection 10 mg (has no administration in time range)  thiamine  (VITAMIN B1) tablet 100 mg (has no administration in time range)  folic acid  (FOLVITE ) tablet 1 mg (has no administration in time range)    Clinical Course as of 03/09/24 0224  Sat Mar 09, 2024  0048 Consult to cardiologist Dr. Donnel, who recommends medicine admission for syncope workup to rule out other etiologies of syncope. [RS]  0125 Per Dr. Lee,  hospitalist, she will not be able to call to receive sign out on this patient for admission for at least one hour.  [RS]  0155 Consult to Dr. Lee, hospitalist, who is agreeable to admitting this patient to her service for syncopal workup. I appreciate her collaboration in the care of this patient.  [RS]    Clinical Course User Index [RS] Dmauri Rosenow, Pleasant SAUNDERS, PA-C                                 Medical Decision Making 78 y/o female who presents with syncope.  Bradycardic with heart rate in the 50s, vital signs otherwise reassuring at time of arrival. Cardiopulmonary exam with sinus bradycardia. Abdominal exam is benign, no LE edema.   The differential for syncope is extensive and includes, but is not limited to: arrythmia (Vtach, SVT, SSS, sinus arrest, AV block, bradycardia), aortic stenosis, AMI, hypertrophic obstructive cardiomyopathy (HOCM), PE, atrial myxoma, pulmonary hypertension, orthostatic hypotension, hypovolemia, drug effect, GB syndrome, micturition, cough, carotid sinus sensitivity, seizure, TIA/CVA, hypoglycemia, vertigo.   Amount and/or Complexity of Data Reviewed Labs: ordered.    Details: CBC unremarkable, CMP with hyponatremia at 126, at baseline of 1.38.  INR is normal, EtOH 96.   Radiology: ordered.    Details: Chest x-ray negative for acute cardiopulmonary disease. ECG/medicine tests:     Details: EKG with sinus bradycardia.  Risk Decision regarding hospitalization.   Patient will require admission to the hospital for syncope workup. Cardiology, hospitalist consulted as above.  Jacquilyn voiced understanding of her medical evaluation and treatment plan. Each of their questions answered to their expressed satisfaction.  She is amenable to plan for admission at this time.   This chart was dictated using voice recognition software, Dragon. Despite the best efforts of this provider to proofread and correct errors, errors may still occur which can change documentation  meaning.      Final diagnoses:  Syncope and collapse    ED Discharge Orders     None          Bobette Pleasant JONELLE DEVONNA 03/09/24 0224    Ula Prentice JONELLE, MD 03/12/24 [phone]

## 2024-03-09 ENCOUNTER — Other Ambulatory Visit: Payer: Self-pay | Admitting: Cardiology

## 2024-03-09 ENCOUNTER — Encounter (HOSPITAL_COMMUNITY): Payer: Self-pay | Admitting: Internal Medicine

## 2024-03-09 ENCOUNTER — Other Ambulatory Visit: Payer: Self-pay

## 2024-03-09 DIAGNOSIS — R55 Syncope and collapse: Secondary | ICD-10-CM | POA: Diagnosis not present

## 2024-03-09 DIAGNOSIS — I440 Atrioventricular block, first degree: Secondary | ICD-10-CM | POA: Insufficient documentation

## 2024-03-09 DIAGNOSIS — E871 Hypo-osmolality and hyponatremia: Secondary | ICD-10-CM

## 2024-03-09 DIAGNOSIS — N182 Chronic kidney disease, stage 2 (mild): Secondary | ICD-10-CM | POA: Diagnosis not present

## 2024-03-09 DIAGNOSIS — I10 Essential (primary) hypertension: Secondary | ICD-10-CM

## 2024-03-09 DIAGNOSIS — D638 Anemia in other chronic diseases classified elsewhere: Secondary | ICD-10-CM | POA: Diagnosis not present

## 2024-03-09 DIAGNOSIS — E7849 Other hyperlipidemia: Secondary | ICD-10-CM

## 2024-03-09 LAB — BASIC METABOLIC PANEL WITH GFR
Anion gap: 11 (ref 5–15)
Anion gap: 9 (ref 5–15)
BUN: 14 mg/dL (ref 8–23)
BUN: 14 mg/dL (ref 8–23)
CO2: 21 mmol/L — ABNORMAL LOW (ref 22–32)
CO2: 24 mmol/L (ref 22–32)
Calcium: 9 mg/dL (ref 8.9–10.3)
Calcium: 9.1 mg/dL (ref 8.9–10.3)
Chloride: 97 mmol/L — ABNORMAL LOW (ref 98–111)
Chloride: 97 mmol/L — ABNORMAL LOW (ref 98–111)
Creatinine, Ser: 1.14 mg/dL — ABNORMAL HIGH (ref 0.44–1.00)
Creatinine, Ser: 1.01 mg/dL — ABNORMAL HIGH (ref 0.44–1.00)
GFR, Estimated: 49 mL/min — ABNORMAL LOW (ref >60)
GFR, Estimated: 57 mL/min — ABNORMAL LOW (ref >60)
Glucose, Bld: 105 mg/dL — ABNORMAL HIGH (ref 70–99)
Glucose, Bld: 105 mg/dL — ABNORMAL HIGH (ref 70–99)
Potassium: 3.8 mmol/L (ref 3.5–5.1)
Potassium: 4.5 mmol/L (ref 3.5–5.1)
Sodium: 129 mmol/L — ABNORMAL LOW (ref 135–145)
Sodium: 130 mmol/L — ABNORMAL LOW (ref 135–145)

## 2024-03-09 LAB — GLUCOSE, CAPILLARY: Glucose-Capillary: 115 mg/dL — ABNORMAL HIGH (ref 70–99)

## 2024-03-09 LAB — VITAMIN B12: Vitamin B-12: 156 pg/mL — ABNORMAL LOW (ref 180–914)

## 2024-03-09 LAB — TROPONIN I (HIGH SENSITIVITY)
Troponin I (High Sensitivity): 3 ng/L (ref <18)
Troponin I (High Sensitivity): 3 ng/L (ref <18)

## 2024-03-09 LAB — OSMOLALITY: Osmolality: 276 mosm/kg (ref 275–295)

## 2024-03-09 MED ORDER — SODIUM CHLORIDE 0.9 % IV SOLN: INTRAVENOUS | Status: AC

## 2024-03-09 MED ORDER — FOLIC ACID 1 MG PO TABS
1.0000 mg | ORAL_TABLET | Freq: Every day | ORAL | Status: DC
  Administered 2024-03-09 – 2024-03-10 (×2): 1 mg via ORAL
  Filled 2024-03-09 (×2): qty 1

## 2024-03-09 MED ORDER — ROSUVASTATIN CALCIUM 5 MG PO TABS
5.0000 mg | ORAL_TABLET | Freq: Every evening | ORAL | Status: DC
  Administered 2024-03-09: 5 mg via ORAL
  Filled 2024-03-09: qty 1

## 2024-03-09 MED ORDER — ACETAMINOPHEN 325 MG PO TABS: 650.0000 mg | ORAL_TABLET | Freq: Four times a day (QID) | ORAL | Status: DC | PRN

## 2024-03-09 MED ORDER — SODIUM CHLORIDE 0.9 % IV SOLN: 250.0000 mL | INTRAVENOUS | Status: AC | PRN

## 2024-03-09 MED ORDER — THIAMINE MONONITRATE 100 MG PO TABS
100.0000 mg | ORAL_TABLET | Freq: Every day | ORAL | Status: DC
  Administered 2024-03-09 – 2024-03-10 (×2): 100 mg via ORAL
  Filled 2024-03-09 (×2): qty 1

## 2024-03-09 MED ORDER — LOSARTAN POTASSIUM 50 MG PO TABS: 100.0000 mg | ORAL_TABLET | Freq: Every day | ORAL | Status: DC

## 2024-03-09 MED ORDER — SODIUM CHLORIDE 0.9% FLUSH: 3.0000 mL | INTRAVENOUS | Status: DC | PRN

## 2024-03-09 MED ORDER — ONDANSETRON HCL 4 MG PO TABS: 4.0000 mg | ORAL_TABLET | Freq: Four times a day (QID) | ORAL | Status: DC | PRN

## 2024-03-09 MED ORDER — HEPARIN SODIUM (PORCINE) 5000 UNIT/ML IJ SOLN
5000.0000 [IU] | Freq: Three times a day (TID) | INTRAMUSCULAR | Status: DC
  Administered 2024-03-09 – 2024-03-10 (×4): 5000 [IU] via SUBCUTANEOUS
  Filled 2024-03-09 (×4): qty 1

## 2024-03-09 MED ORDER — SODIUM CHLORIDE 0.9% FLUSH
3.0000 mL | Freq: Two times a day (BID) | INTRAVENOUS | Status: DC
  Administered 2024-03-09: 3 mL via INTRAVENOUS

## 2024-03-09 MED ORDER — ONDANSETRON HCL 4 MG/2ML IJ SOLN: 4.0000 mg | Freq: Four times a day (QID) | INTRAMUSCULAR | Status: DC | PRN

## 2024-03-09 MED ORDER — SODIUM CHLORIDE 0.9% FLUSH
3.0000 mL | Freq: Two times a day (BID) | INTRAVENOUS | Status: DC
  Administered 2024-03-09 – 2024-03-10 (×3): 3 mL via INTRAVENOUS

## 2024-03-09 MED ORDER — LOSARTAN POTASSIUM 50 MG PO TABS
100.0000 mg | ORAL_TABLET | Freq: Every day | ORAL | Status: DC
  Administered 2024-03-09 – 2024-03-10 (×2): 100 mg via ORAL
  Filled 2024-03-09 (×2): qty 2

## 2024-03-09 MED ORDER — ACETAMINOPHEN 650 MG RE SUPP: 650.0000 mg | Freq: Four times a day (QID) | RECTAL | Status: DC | PRN

## 2024-03-09 MED ORDER — HYDRALAZINE HCL 20 MG/ML IJ SOLN: 10.0000 mg | Freq: Four times a day (QID) | INTRAMUSCULAR | Status: DC | PRN

## 2024-03-09 NOTE — Progress Notes (Signed)
..event  +  +  +  +  +  +  +  +  +  +  +  +  +  +  +  +  +  +  +  +  +  +  +  +  +  +  +  +  +  +  +  +  +  +  +  +  +  +  +  +  +  +  +  +  +  +  +  +  +  +  +  +  +  +  +  +  +  +  +  +  +  +  +  +  +  +  +  +  +  +  +  +  +  +  +  +  +  +  +  +  +  +  +  +  +  +  +  +  +  +  +  +  +  +  +  +  +  +  +  +  +  +  +  +  +  +  +  +  +  +  +  +  +  +  +  +  +  +  +  +  +  +  +  +  +  +  +  +  +  +  +  +  +  +  +  +  +  +  +  +  +  +  +  +  +  +  +  +  +  +  +  +  +  +  +  +  +  +  +  +  +  +  +  +  +  +  +  +  +  +  +  +  +  +  +  +  +  +  +  +  +  +  +  +  +  +  +  +  +  +  +  +  +  +  +  +  +  +  +  +  +  +  +  +  +  +  +  +  +  +  +  +  +  +  +  +  +  +  +  +  +  +  +  +  +  +  +  +  +  +  +  +  +  +  +  +  +  +  +  +  +  +  +  +  +  +  +  +  +  +  +  +  +  +  +  +  +  +  +  +  +  +  +  +  +  +  +  +  +  +  +  +  +  +  +  +  +  +  +  +  +  +  +  +  +  +  +  +  +  +  +  +  +  +  +  +  +  +  +  +  +  +  +  +  +  +  +  +  +  +  +  +  +  +  +  +  +  +  +  +  +  +  +  +  +  +  +  +  +  +  +  +  +  +  +  +  +  +  +  +  +  +  +  +  +  +  +  +  +  +  +  +  +  +  +  +  +  +  +  +  +  +  +  +  +  +  +  +  +  +  +  +  +  +  +  +  +  +  +  +  +  +  +  +  +  +  +  +  +  +  +  +  +  +  +  +  +  +  +  +  +  +  +  +  +  +  +  +  +  +  +  +  +  +  +  +  +  +  +  +  +  +  +  +  +  +  +  +  +  +  +  +  +  +  +  +  +  +  +  +  +  +  +  +  +  +  +  +  +  +  +  +  +  +  +  +  +  +  +  +  +  +  +  +  +  +  +  +  +  +  +  +  +  +  +  +  +  +  +  +  +  +  +  +  +  +  +  +  +  +  +  +  +  +  +  +  +  +  +  +  +  +  +  +  +  +  +  +  +  +  +  +  +  +  +  +  +  +  +  +  +  +  +  +  +  +  +  +  +  +  +  +  +  +  +  +  +  +  +  +  +  +  +  +  +  +  +  +  +  +  +  +  +  +  +  +  +  +  +  +  +  +  +  +  +  +  +  +  +  +  +  +  +  +  +  +  +  +  +  +  +  +  +  +  +  +  +  +  +  +  +  +  +  +  +  +  +  +  +  +  +  +  +  +  +  +  +  +  +  +  +  +  +  +  +  +  +  +  +  +  +  +  +  +  +  +  +  +  +  +  +  +  +  +  +  +  +  +  +  +  +  +  +  +  +  +  +   +  +  +  +  +  +  +  +  +  +  +  +  +  +  +  +  +  +  +  +  +  +  +  +  +  +  +  +  +  +  +  +  +

## 2024-03-09 NOTE — Consult Note (Signed)
 Cardiology Consultation   Patient ID: [name] [facility] MRN: [id number]; DOB: 1946/02/03  Admit date: 03/08/2024 Date of Consult: 03/09/2024  PCP:  Henry Ingle, MD   [facility] Providers Cardiologist:  None        Patient Profile: [name] is a 78 y.o. female with a hx of right lung cancer status post lobectomy, lymph node resection, chemo therapy, history of syncope back in 2018, Hypertension, hyperlipidemiawho is being seen 03/09/2024 for the evaluation of syncope at the request of Dr. Prentice.  History of Present Illness: [name] is a 78 y.o. female with a hx of right lung cancer status post lobectomy, lymph node resection, chemo therapy, history of syncope back in 2018, Hypertension, hyperlipidemiawho is being seen 03/09/2024 for the evaluation of syncope   Patient [place] but did feel dizzy lightheaded before passing out and had loss of consciousness, EMS reported heart rate in upper 40s and 50s, sinus bradycardia, on arrival to ER she remains in sinus bradycardia in upper 50s, hypertensive blood pressure 156/63 mmHg. Patient also had some alcohol but not intoxicated. EKG shows sinus bradycardia. Laboratory investigation showed creatinine 1.38 increased from baseline, sodium down to 126, chloride down to 95, troponin first is negative, leukosis normal.  Chest x-ray is negative. Cardiology is consulted for bradycardia.  Patien'ts friend is accompained in the room- reports they were all sitting, had 1 beer and 1 fireball and then she felt dizzy- asked for water- mumbled and then passed out completely- she also felt jerking movement and urination.  She has had h/o prior syncope for many years till 2018- do not see any work up like monitor or ILR- this seems like vasovagal   Past Medical History:  Diagnosis Date   Arthritis    back    Cavitating mass in right upper lung lobe    CKD (chronic kidney disease) stage  3, GFR 30-59 ml/min (HCC) 03/12/2015   Diverticulosis    Dizziness    occasionally    Family history of kidney infection    pt history-saw Dr.Wrenn   GERD (gastroesophageal reflux disease)    takes Nexium daily   History of colon polyps    Hyperlipidemia    takes Zocor  daily   Hypertension    takes Amlodipine  daily and Chlorthalidone  as well   Lung cancer, Right upper lobe dx'd 07/2013   Lung nodules    right upper lobe   Nocturia    Pleural effusion on right 04/05/2017    Past Surgical History:  Procedure Laterality Date   ABDOMINAL HYSTERECTOMY     at age 45;partial    CARDIOVASCULAR STRESS TEST  06/29/2009   EF 79%, NO ISCHEMIA   COLONOSCOPY     IR THORACENTESIS ASP PLEURAL SPACE W/IMG GUIDE  04/25/2017   ORIF TIBIA PLATEAU Left 09/22/2017   Procedure: OPEN REDUCTION INTERNAL FIXATION (ORIF) TIBIAL PLATEAU;  Surgeon: Kendal Franky SQUIBB, MD;  Location: [facility] OR;  Service: Orthopedics;  Laterality: Left;   TRACHEOSTOMY     at age 15 or 5 d/t polyps on vocal cord   VIDEO ASSISTED THORACOSCOPY (VATS)/WEDGE RESECTION Right 07/08/2013   Procedure: VIDEO ASSISTED THORACOSCOPY (VATS)/WEDGE RESECTION;  Surgeon: Dallas KATHEE Jude, MD;  Location: [facility] OR;  Service: Thoracic;  Laterality: Right;   VIDEO BRONCHOSCOPY N/A 07/08/2013   Procedure: VIDEO BRONCHOSCOPY;  Surgeon: Dallas KATHEE Jude, MD;  Location: [facility] OR;  Service: Thoracic;  Laterality: N/A;     Home Medications:  Prior to Admission medications   Medication Sig Start Date End Date Taking? Authorizing Provider  alendronate (FOSAMAX) 70 MG tablet Take 70 mg by mouth once a week. 01/10/20  Yes [provider]  diltiazem (CARDIZEM CD) 240 MG 24 hr capsule Take 240 mg by mouth daily. 09/11/17  Yes [provider]  furosemide  (LASIX ) 20 MG tablet Take 1 tablet (20 mg total) by mouth daily. Patient taking differently: Take 20 mg by mouth every Monday, Wednesday, and Friday. 05/13/16  Yes Hongalgi, Trenda BIRCH, MD  KLOR-CON  10 10 MEQ  tablet Take 10 mEq by mouth daily. 06/25/15  Yes [provider]  losartan  (COZAAR ) 100 MG tablet Take 1 tablet (100 mg total) by mouth daily. 09/27/17  Yes Shahmehdi, Adriana LABOR, MD  metoprolol  succinate (TOPROL -XL) 100 MG 24 hr tablet Take 100 mg by mouth daily. 02/06/24  Yes [provider]  Multiple Vitamin (MULTIVITAMIN WITH MINERALS) TABS tablet Take 1 tablet by mouth daily. 05/14/16  Yes Hongalgi, Anand D, MD  Multiple Vitamins-Minerals (PRESERVISION AREDS 2 PO) Take 1 capsule by mouth in the morning and at bedtime.   Yes [provider]  rosuvastatin  (CRESTOR ) 5 MG tablet Take 5 mg by mouth every evening. 02/06/24  Yes [provider]    Scheduled Meds:  Continuous Infusions:  PRN Meds:   Allergies:   No Known Allergies  Social History:   Social History   Socioeconomic History   Marital status: Married    Spouse name: Not on file   Number of children: Not on file   Years of education: Not on file   Highest education level: Not on file  Occupational History   Not on file  Tobacco Use   Smoking status: Former    Current packs/day: 0.00    Types: Cigarettes    Quit date: 08/08/1978    Years since quitting: 45.6   Smokeless tobacco: Never   Tobacco comments:    quit 35 yrs ago  Substance and Sexual Activity   Alcohol use: Yes    Comment: several beers a day and shots of crown   Drug use: No   Sexual activity: Not Currently    Birth control/protection: Surgical  Other Topics Concern   Not on file  Social History Narrative   Not on file   Social Drivers of Health   Financial Resource Strain: Not on file  Food Insecurity: Not on file  Transportation Needs: Not on file  Physical Activity: Not on file  Stress: Not on file  Social Connections: Not on file  Intimate Partner Violence: Not on file    Family History:    Family History  Problem Relation Age of Onset   Heart attack Father    Cancer Brother        stomach     ROS:   Please see the history of present illness.   All other ROS reviewed and negative.     Physical Exam/Data: Vitals:   03/09/24 0015 03/09/24 0030 03/09/24 0045 03/09/24 0100  BP: (!) 150/61 (!) 145/62 (!) 146/68 (!) 154/62  Pulse:      Resp: 16 13 18 11   Temp:      TempSrc:      SpO2:    100%  Weight:      Height:       No intake or output data in the 24 hours ending 03/09/24 0143    03/08/2024   10:12 PM 05/03/2022    8:37 AM  01/30/2020   10:26 AM  Last 3 Weights  Weight (lbs) 142 lb 141 lb 4.8 oz 143 lb  Weight (kg) 64.411 kg 64.093 kg 64.864 kg     Body mass index is 22.92 kg/m.  General:  Well nourished, well developed, in no acute distress HEENT: normal Neck: no JVD Vascular: No carotid bruits; Distal pulses 2+ bilaterally Cardiac:  normal S1, S2; RRR; no murmur  Lungs:  clear to auscultation bilaterally, no wheezing, rhonchi or rales  Abd: soft, nontender, no hepatomegaly  Ext: no edema Musculoskeletal:  No deformities, BUE and BLE strength normal and equal Skin: warm and dry  Neuro:  CNs 2-12 intact, no focal abnormalities noted Psych:  Normal affect     Laboratory Data: High Sensitivity Troponin:   Recent Labs  Lab 03/08/24 2321  TROPONINIHS 3     Chemistry Recent Labs  Lab 03/08/24 2226  NA 126*  K 3.9  CL 95*  CO2 20*  GLUCOSE 103*  BUN 17  CREATININE 1.38*  CALCIUM  8.8*  GFRNONAA 39*  ANIONGAP 11    Recent Labs  Lab 03/08/24 2226  PROT 5.9*  ALBUMIN 4.0  AST 31  ALT 13  ALKPHOS 77  BILITOT 0.8   Lipids No results for input(s): CHOL, TRIG, HDL, LABVLDL, LDLCALC, CHOLHDL in the last 168 hours.  Hematology Recent Labs  Lab 03/08/24 2226  WBC 6.8  RBC 3.64*  HGB 12.5  HCT 36.5  MCV 100.3*  MCH 34.3*  MCHC 34.2  RDW 12.0  PLT 209   Thyroid No results for input(s): TSH, FREET4 in the last 168 hours.  BNPNo results for input(s): BNP, PROBNP in the last 168 hours.  DDimer No results for input(s): DDIMER  in the last 168 hours.  Radiology/Studies:  DG Chest Port 1 View Result Date: 03/08/2024 EXAM: 1 VIEW XRAY OF THE CHEST 03/08/2024 10:30:00 PM COMPARISON: 07/13/2021, unchanged. Right costophrenic angle blunting. CLINICAL HISTORY: Syncope. FINDINGS: LUNGS AND PLEURA: No focal pulmonary opacity. No pulmonary edema. No pleural effusion. No pneumothorax. Right costophrenic angle blunting. HEART AND MEDIASTINUM: No acute abnormality of the cardiac and mediastinal silhouettes. BONES AND SOFT TISSUES: No acute osseous abnormality. IMPRESSION: 1. No acute findings. 2. Unchanged right costophrenic angle blunting. 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 CKD stage III. Prior history of syncope and collapse, did not get loop recorder History of non-small cell lung cancer status post resection, adjuvant chemotherapy  Plan. Admitted under medicine. Orthostatics reported negative by ER. Status post IV fluid. Continue telemetry.  Hold Cardizem and metoprolol . Unclear why she needs these but suspect for HTN- probably d/c and alternate medication. Obtain echocardiogram. I am not clear about her cause of syncope, could be vasovagal could be something else, her heart rate is currently just sinus bradycardia, no AV blocks noted other than first-degree AV block which she had before, she will benefit from telemetry and Holter monitor,  ->no more fireball/etoh. Encourage hydration.  We will follow along  Risk Assessment/Risk Scores:              For questions or updates, please contact [facility] Please consult [email or web address] for contact info under    Signed, Grayce Bold, MD  03/09/2024 1:43 AM

## 2024-03-09 NOTE — Plan of Care (Signed)
  Problem: Education: Goal: Knowledge of General Education information will improve Description: Including pain rating scale, medication(s)/side effects and non-pharmacologic comfort measures Outcome: Progressing   Problem: Clinical Measurements: Goal: Ability to maintain clinical measurements within normal limits will improve Outcome: Progressing   Problem: Clinical Measurements: Goal: Diagnostic test results will improve Outcome: Progressing   Problem: Clinical Measurements: Goal: Respiratory complications will improve Outcome: Progressing   Problem: Clinical Measurements: Goal: Cardiovascular complication will be avoided Outcome: Progressing   Problem: Activity: Goal: Risk for activity intolerance will decrease Outcome: Progressing

## 2024-03-09 NOTE — ED Notes (Signed)
 Pt ambulated to and from the restroom with an unsteady gait. This tech provided assistance during ambulation.

## 2024-03-09 NOTE — H&P (Signed)
 History and Physical    [name] FMW:[id number] DOB: Apr 16, 1946 DOA: 03/08/2024  PCP: Henry Ingle, MD   Patient coming from: Home   Chief Complaint:  Chief Complaint  Patient presents with   Loss of Consciousness   ED TRIAGE note:  Pt BIBA coming in to be seen for a syncopal episode at her clubhouse. PT was seen by her friends to be out of it for about a minute. Pt has been A&O x 4 with EMS. PT has a 20G in the LAC. No interventions given. PT has been bradycardic with her heart rate ranging from 45-53. PT BGL with EMS was 113    HPI:  [name] is a 78 y.o. female with medical history significant of hyperlipidemia, essential hypertension, tibial plateau fracture status post ORIF, CKD stage II, anemia of chronic disease and history of lung cancer s/p lobectomy and chemotherapy presented emergency department for evaluation for syncopal episode.  Patient's friend at the bedside reported that they are at the clubhouse having dinner with some wine.  Suddenly she noticed that patient is mumbling and afterward she passed out completely.  Patient was taken from highchair to low chair to prevent fall.  Patient able to remember the event of syncope however denies any prodrome.  Patient's friend at the bedside reported that patient had similar episodes almost 4 years ago and since patient has lost her husband during COVID she has not been eating well however patient is stating that she is not feeling depressed and has good social support.  Patient denies any dizziness, headache, blurry vision, shortness of breath, chest pain, palpitation and feeling skipping of heartbeats.  Patient denies any excessive alcohol use, nausea, vomiting and diarrhea.  EMS found that patient had heart rate upper 40s and 50s range and in the  ED patient found to be bradycardic as well heart rate upper 50s range.  Otherwise hemodynamically stable.  Patient reported that she has some alcohol but not  intoxicated.  In the ED blood pressure ranging between upper 140s to 50s range and heart rate in between 47-55.  CBC showed low sodium 126 (baseline sodium around 129-132), low chloride 95, low bicarb 20, elevated creatinine 1.38.  CBC unremarkable.  Normal pro time INR.  Elevated blood alcohol level 96. Troponin within normal range.  EKG shows first-degree AV block-sinus bradycardia heart rate 55 and prolonged PR interval.  Chest x-ray no acute disease process.  Unchanged right costophrenic angle blunting.  ED physician consulted cardiology.Hospitalist has been consulted for further evaluation management of syncope, hyponatremia, and first-degree AV block.   Significant labs in the ED: Lab Orders         CBC WITH DIFFERENTIAL         Comprehensive metabolic panel         Protime-INR         Ethanol         Basic metabolic panel         Osmolality         Sodium, urine, random         Osmolality, urine         CBG monitoring, ED       Review of Systems:  Review of Systems  Constitutional:  Negative for chills, fever, malaise/fatigue and weight loss.  Eyes:  Negative for blurred vision.  Respiratory:  Negative for cough, sputum production and shortness of breath.   Cardiovascular:  Negative for chest pain, palpitations and leg swelling.  Gastrointestinal:  Negative for abdominal pain, diarrhea, heartburn, nausea and vomiting.  Musculoskeletal:  Negative for back pain, falls, joint pain and myalgias.  Neurological:  Positive for loss of consciousness. Negative for dizziness, tremors, sensory change, speech change, focal weakness, seizures, weakness and headaches.  Psychiatric/Behavioral:  The patient is not nervous/anxious.     Past Medical History:  Diagnosis Date   Arthritis    back    Cavitating mass in right upper lung lobe    CKD (chronic kidney disease) stage 3, GFR 30-59 ml/min (HCC) 03/12/2015   Diverticulosis    Dizziness    occasionally    Family history of  kidney infection    pt history-saw Dr.Wrenn   GERD (gastroesophageal reflux disease)    takes Nexium daily   History of colon polyps    Hyperlipidemia    takes Zocor  daily   Hypertension    takes Amlodipine  daily and Chlorthalidone  as well   Lung cancer, Right upper lobe dx'd 07/2013   Lung nodules    right upper lobe   Nocturia    Pleural effusion on right 04/05/2017    Past Surgical History:  Procedure Laterality Date   ABDOMINAL HYSTERECTOMY     at age 35;partial    CARDIOVASCULAR STRESS TEST  06/29/2009   EF 79%, NO ISCHEMIA   COLONOSCOPY     IR THORACENTESIS ASP PLEURAL SPACE W/IMG GUIDE  04/25/2017   ORIF TIBIA PLATEAU Left 09/22/2017   Procedure: OPEN REDUCTION INTERNAL FIXATION (ORIF) TIBIAL PLATEAU;  Surgeon: Kendal Franky SQUIBB, MD;  Location: [facility] OR;  Service: Orthopedics;  Laterality: Left;   TRACHEOSTOMY     at age 41 or 5 d/t polyps on vocal cord   VIDEO ASSISTED THORACOSCOPY (VATS)/WEDGE RESECTION Right 07/08/2013   Procedure: VIDEO ASSISTED THORACOSCOPY (VATS)/WEDGE RESECTION;  Surgeon: Dallas KATHEE Jude, MD;  Location: [facility] OR;  Service: Thoracic;  Laterality: Right;   VIDEO BRONCHOSCOPY N/A 07/08/2013   Procedure: VIDEO BRONCHOSCOPY;  Surgeon: Dallas KATHEE Jude, MD;  Location: [facility] OR;  Service: Thoracic;  Laterality: N/A;     reports that she quit smoking about 45 years ago. Her smoking use included cigarettes. She has never used smokeless tobacco. She reports current alcohol use. She reports that she does not use drugs.  No Known Allergies  Family History  Problem Relation Age of Onset   Heart attack Father    Cancer Brother        stomach    Prior to Admission medications   Medication Sig Start Date End Date Taking? Authorizing Provider  alendronate (FOSAMAX) 70 MG tablet Take 70 mg by mouth once a week. 01/10/20  Yes [provider]  diltiazem (CARDIZEM CD) 240 MG 24 hr capsule Take 240 mg by mouth daily. 09/11/17  Yes [provider]  furosemide   (LASIX ) 20 MG tablet Take 1 tablet (20 mg total) by mouth daily. Patient taking differently: Take 20 mg by mouth every Monday, Wednesday, and Friday. 05/13/16  Yes Hongalgi, Trenda BIRCH, MD  KLOR-CON  10 10 MEQ tablet Take 10 mEq by mouth daily. 06/25/15  Yes [provider]  losartan  (COZAAR ) 100 MG tablet Take 1 tablet (100 mg total) by mouth daily. 09/27/17  Yes Shahmehdi, Adriana LABOR, MD  metoprolol  succinate (TOPROL -XL) 100 MG 24 hr tablet Take 100 mg by mouth daily. 02/06/24  Yes [provider]  Multiple Vitamin (MULTIVITAMIN WITH MINERALS) TABS tablet Take 1 tablet by mouth daily. 05/14/16  Yes Hongalgi, Anand D, MD  Multiple Vitamins-Minerals (  PRESERVISION AREDS 2 PO) Take 1 capsule by mouth in the morning and at bedtime.   Yes [provider]  rosuvastatin  (CRESTOR ) 5 MG tablet Take 5 mg by mouth every evening. 02/06/24  Yes [provider]     Physical Exam: Vitals:   03/09/24 0230 03/09/24 0245 03/09/24 0300 03/09/24 0315  BP: (!) 164/74 (!) 157/60 (!) 164/67 (!) 168/62  Pulse: (!) 57 (!) 58 (!) 59 63  Resp: 12 18 15 12   Temp:      TempSrc:      SpO2: 100% 100% 100% 100%  Weight:      Height:        Physical Exam Vitals and nursing note reviewed.  Constitutional:      Appearance: Normal appearance. She is not ill-appearing.  HENT:     Mouth/Throat:     Mouth: Mucous membranes are moist.  Eyes:     Pupils: Pupils are equal, round, and reactive to light.  Cardiovascular:     Rate and Rhythm: Regular rhythm. Bradycardia present.     Pulses: Normal pulses.     Heart sounds: Normal heart sounds.  Pulmonary:     Effort: Pulmonary effort is normal.     Breath sounds: Normal breath sounds.  Abdominal:     Palpations: Abdomen is soft.  Musculoskeletal:     Cervical back: Neck supple.  Skin:    Capillary Refill: Capillary refill takes less than 2 seconds.  Neurological:     Mental Status: She is alert and oriented to person, place, and time.      Cranial Nerves: No cranial nerve deficit.     Sensory: No sensory deficit.     Motor: No weakness.     Coordination: Coordination normal.  Psychiatric:        Mood and Affect: Mood normal.      Labs on Admission: I have personally reviewed following labs and imaging studies  CBC: Recent Labs  Lab 03/08/24 2226  WBC 6.8  NEUTROABS 3.9  HGB 12.5  HCT 36.5  MCV 100.3*  PLT 209   Basic Metabolic Panel: Recent Labs  Lab 03/08/24 2226  NA 126*  K 3.9  CL 95*  CO2 20*  GLUCOSE 103*  BUN 17  CREATININE 1.38*  CALCIUM  8.8*   GFR: Estimated Creatinine Clearance: 31.5 mL/min (A) (by C-G formula based on SCr of 1.38 mg/dL (H)). Liver Function Tests: Recent Labs  Lab 03/08/24 2226  AST 31  ALT 13  ALKPHOS 77  BILITOT 0.8  PROT 5.9*  ALBUMIN 4.0   No results for input(s): LIPASE, AMYLASE in the last 168 hours. No results for input(s): AMMONIA in the last 168 hours. Coagulation Profile: Recent Labs  Lab 03/08/24 2226  INR 0.9   Cardiac Enzymes: Recent Labs  Lab 03/08/24 2321 03/09/24 0117  TROPONINIHS 3 3   BNP (last 3 results) No results for input(s): BNP in the last 8760 hours. HbA1C: No results for input(s): HGBA1C in the last 72 hours. CBG: No results for input(s): GLUCAP in the last 168 hours. Lipid Profile: No results for input(s): CHOL, HDL, LDLCALC, TRIG, CHOLHDL, LDLDIRECT in the last 72 hours. Thyroid Function Tests: No results for input(s): TSH, T4TOTAL, FREET4, T3FREE, THYROIDAB in the last 72 hours. Anemia Panel: No results for input(s): VITAMINB12, FOLATE, FERRITIN, TIBC, IRON, RETICCTPCT in the last 72 hours. Urine analysis:    Component Value Date/Time   COLORURINE STRAW (A) 09/21/2017 0210   APPEARANCEUR CLEAR 09/21/2017 0210  LABSPEC 1.008 09/21/2017 0210   PHURINE 5.0 09/21/2017 0210   GLUCOSEU NEGATIVE 09/21/2017 0210   HGBUR SMALL (A) 09/21/2017 0210   BILIRUBINUR NEGATIVE  09/21/2017 0210   KETONESUR NEGATIVE 09/21/2017 0210   PROTEINUR NEGATIVE 09/21/2017 0210   UROBILINOGEN 0.2 07/03/2013 0839   NITRITE NEGATIVE 09/21/2017 0210   LEUKOCYTESUR NEGATIVE 09/21/2017 0210    Radiological Exams on Admission: I have personally reviewed images DG Chest Port 1 View Result Date: 03/08/2024 EXAM: 1 VIEW XRAY OF THE CHEST 03/08/2024 10:30:00 PM COMPARISON: 07/13/2021, unchanged. Right costophrenic angle blunting. CLINICAL HISTORY: Syncope. FINDINGS: LUNGS AND PLEURA: No focal pulmonary opacity. No pulmonary edema. No pleural effusion. No pneumothorax. Right costophrenic angle blunting. HEART AND MEDIASTINUM: No acute abnormality of the cardiac and mediastinal silhouettes. BONES AND SOFT TISSUES: No acute osseous abnormality. IMPRESSION: 1. No acute findings. 2. Unchanged right costophrenic angle blunting. Electronically signed by: Franky Stanford MD 03/08/2024 10:35 PM EDT RP Workstation: [id number]     EKG: My personal interpretation of EKG shows: First-degree AV block heart rate 55.    Assessment/Plan: Principal Problem:   Syncope and collapse Active Problems:   Hyperlipidemia   History of lung cancer   Essential hypertension   Hyponatremia   First degree AV block   CKD (chronic kidney disease), stage II   Anemia of chronic disease    Assessment and Plan: Syncope and collapse-multifactorial -Present emergency department complaining of syncope and collapse.  Patient was in the clubhouse had few drinks of alcohol and passed out in the parking lot.  EMS found bradycardic heart rate 40s to 50s range.  Otherwise hemodynamically stable. - At presentation to ED heart rate in between 47-55 and borderline hypertensive. - CMP showed low sodium 126, elevated creatinine 1.38 low chloride 96.  CBC unremarkable.  Blood alcohol level elevated 96.  Troponin x 2 within normal range.  EKG showed first-degree AV block heart rate 55. - Concern for syncope and  collapse-multifactorial including bradycardia/posterior AV block, hyponatremia, alcohol use and dehydration. - For the management of bradycardia holding Cardizem and Toprol -XL.  Obtaining echocardiogram and continue cardiac monitoring.  Cardiology also has been consulted for further management and evaluation - For the management of hyponatremia starting NS 75 cc/h.  Checking serum osmolarity, urine osmolarity and urine sodium.  Monitor BMP every 6 hours. -For management of AKI continue NS 75 cc/h. - For the management of acute alcohol use continue CIWA protocol. - Continue fall precaution. -Check orthostatic vital. -On discharge patient need Zio patch and need to follow-up with cardiology outpatient as well.   First-degree AV block -Heart rate in between 40 to 50s range.  EKG shows prolonged PR interval 229 ms evidence for first-degree AV block. - At home patient is on Cardizem to 240 mg and Toprol -XL 100 mg.  Holding both Cardizem and Toprol -XL. -Electrolytes within normal range. - Protein within normal range.  Chest x-ray unremarkable. - Pending echocardiogram. -Continue cardiac monitoring  Essential hypertension -Holding losartan  in the setting of AKI.  Holding Toprol -XL and Cardizem in the setting of first-degree AV block. - Continue hydralazine  as needed.  Once renal function will stabilize can resume losartan  or consider to start amlodipine .  Acute kidney injury superimposed CKD stage II -Elevated creatinine 1.38.  Prerenal acute kidney injury in the setting of dehydration and from alcohol use.  Starting maintenance fluid NS 75 cc/h.  Avoiding nephrotoxic agent, monitor renal function and urine output.  Acute on chronic hyponatremia -Low sodium 126.  Baseline sodium in between  131-134.  Hyponatremia likely in the setting of poor oral solute intake and alcohol use. - Checking serum osmolarity, urine osmolarity urine sodium - Starting maintenance fluid NS 75 cc/h.  Monitor serum  sodium level every 6 hours.  Anemia of chronic disease Prostatic anemia -Stable H&H 12.5 and 36.  Continue to monitor  Chronic alcohol use-social drinker -Elevated blood alcohol level 98.  Continue CIWA protocol.  Starting thiamine  and folic acid .   DVT prophylaxis:  SQ Heparin  Code Status:  Full Code Diet: Heart healthy diet Family Communication:   Family was present at bedside, at the time of interview. Opportunity was given to ask question and all questions were answered satisfactorily.  Disposition Plan:    Consults: Cardiology Admission status:   Observation, Telemetry bed  Severity of Illness: The appropriate patient status for this patient is OBSERVATION. Observation status is judged to be reasonable and necessary in order to provide the required intensity of service to ensure the patient's safety. The patient's presenting symptoms, physical exam findings, and initial radiographic and laboratory data in the context of their medical condition is felt to place them at decreased risk for further clinical deterioration. Furthermore, it is anticipated that the patient will be medically stable for discharge from the hospital within 2 midnights of admission.     Tamora Huneke, MD [facility]  How to contact the [facility] Attending or Consulting provider 7A - 7P or covering provider during after hours 7P -7A, for this patient.  Check the care team in [facility] and look for a) attending/consulting [facility] provider listed and b) the [facility] team listed Log into [email or web address] and use [place]'s universal password to access. If you do not have the password, please contact the hospital operator. Locate the [facility] provider you are looking for under [facility] and page to a number that you can be directly reached. If you still have difficulty reaching the provider, please page the [facility] (Director on Call) for the Hospitalists listed on amion for assistance.  03/09/2024, 3:23 AM

## 2024-03-09 NOTE — Progress Notes (Signed)
    Subjective:  Denies SSCP, palpitations or Dyspnea No postural symptoms BP a bit elevated  Objective:  Vitals:   03/09/24 0645 03/09/24 0700 03/09/24 0800 03/09/24 0920  BP: (!) 177/64 (!) 179/64 (!) 163/67 (!) 165/65  Pulse: (!) 57 60 (!) 54 (!) 56  Resp: (!) 24 (!) 25 14 (!) 25  Temp:      TempSrc:      SpO2: 100% 100% 99% 100%  Weight:      Height:        Intake/Output from previous day:  Intake/Output Summary (Last 24 hours) at 03/09/2024 1004 Last data filed at 03/09/2024 9094 Gross per 24 hour  Intake 389.58 ml  Output --  Net 389.58 ml    Physical Exam:  Affect appropriate Healthy:  appears stated age HEENT: normal Neck supple with no adenopathy JVP normal no bruits no thyromegaly Lungs clear prior right lobectomy  Heart:  S1/S2 no murmur, no rub, gallop or click PMI normal Abdomen: benighn, BS positve, no tenderness, no AAA no bruit.  No HSM or HJR Distal pulses intact with no bruits No edema Neuro non-focal Skin warm and dry No muscular weakness   Lab Results: Basic Metabolic Panel: Recent Labs    03/08/24 2226 03/09/24 0318  NA 126* 129*  K 3.9 3.8  CL 95* 97*  CO2 20* 21*  GLUCOSE 103* 105*  BUN 17 14  CREATININE 1.38* 1.14*  CALCIUM  8.8* 9.0   Liver Function Tests: Recent Labs    03/08/24 2226  AST 31  ALT 13  ALKPHOS 77  BILITOT 0.8  PROT 5.9*  ALBUMIN 4.0   No results for input(s): LIPASE, AMYLASE in the last 72 hours. CBC: Recent Labs    03/08/24 2226  WBC 6.8  NEUTROABS 3.9  HGB 12.5  HCT 36.5  MCV 100.3*  PLT 209     Imaging: DG Chest Port 1 View Result Date: 03/08/2024 EXAM: 1 VIEW XRAY OF THE CHEST 03/08/2024 10:30:00 PM COMPARISON: 07/13/2021, unchanged. Right costophrenic angle blunting. CLINICAL HISTORY: Syncope. FINDINGS: LUNGS AND PLEURA: No focal pulmonary opacity. No pulmonary edema. No pleural effusion. No pneumothorax. Right costophrenic angle blunting. HEART AND MEDIASTINUM: No acute abnormality  of the cardiac and mediastinal silhouettes. BONES AND SOFT TISSUES: No acute osseous abnormality. IMPRESSION: 1. No acute findings. 2. Unchanged right costophrenic angle blunting. Electronically signed by: Franky Stanford MD 03/08/2024 10:35 PM EDT RP Workstation: [id number]    Cardiac Studies:  ECG: SB rate 54 normal    Telemetry:  NSR no AV block no arrhythmia  Echo: pending  Medications:    folic acid   1 mg Oral Daily   heparin   5,000 Units Subcutaneous Q8H   rosuvastatin   5 mg Oral QPM   sodium chloride  flush  3 mL Intravenous Q12H   sodium chloride  flush  3 mL Intravenous Q12H   thiamine   100 mg Oral Daily      sodium chloride  75 mL/hr at 03/09/24 0912   sodium chloride       Assessment/Plan:   Sycnope:  likely vasovagal in setting of ETOH. Prior ? Vasovagal syncope in 2018. D/c lopressor  as she is bradycardic  Resume home lasix  and losartan  for BP.  Can use norvasc  or hydralazine  as needed for BP. Can d/c home if echo normal. Will arrange outpatient monitor for 30 days.   [name] 03/09/2024, 10:04 AM

## 2024-03-09 NOTE — Progress Notes (Signed)
 Patient seen and examined.  On my exam, patient denies any complaints.  She has been able to walk to the bathroom without getting dizzy or lightheaded.  Of course feels somewhat weak.  Telemetry monitor shows sinus bradycardia.  Blood pressure elevated.  Her good friend is at the bedside.  Syncopal episode with prodrome: Likely vasovagal. Hypovolemic hyponatremia, AKI consistent with dehydration. Ongoing use of alcohol and poor overall oral intake.  Poor solute intake.  Plan: Arrhythmias ruled out.  Echocardiogram pending.  Blood pressure elevated.  Resume losartan .  Discontinue metoprolol  and Cardizem.   Continue IV fluids.  Sodium is appropriately improving.  Monitor every 12 hours. Discussed in detail about dietary intake, patient eats typical tea and toast diet.  Will consult case management to give her education regarding availability of food. Discussed about alcohol consumption, drinks 2-3 times a week and likes to drink fireball.  Encouraged good food intake, protein and fruits along with fluid intake.   Total time spent: 35 minutes.  Same-day admit.  No charge visit.  Anticipate home tomorrow if sodium stabilizes and remains asymptomatic.

## 2024-03-10 ENCOUNTER — Observation Stay (HOSPITAL_BASED_OUTPATIENT_CLINIC_OR_DEPARTMENT_OTHER)

## 2024-03-10 DIAGNOSIS — R55 Syncope and collapse: Secondary | ICD-10-CM

## 2024-03-10 LAB — BASIC METABOLIC PANEL WITH GFR
Anion gap: 8 (ref 5–15)
BUN: 12 mg/dL (ref 8–23)
CO2: 23 mmol/L (ref 22–32)
Calcium: 9.1 mg/dL (ref 8.9–10.3)
Chloride: 99 mmol/L (ref 98–111)
Creatinine, Ser: 1.11 mg/dL — ABNORMAL HIGH (ref 0.44–1.00)
GFR, Estimated: 51 mL/min — ABNORMAL LOW (ref >60)
Glucose, Bld: 92 mg/dL (ref 70–99)
Potassium: 3.7 mmol/L (ref 3.5–5.1)
Sodium: 130 mmol/L — ABNORMAL LOW (ref 135–145)

## 2024-03-10 LAB — ECHOCARDIOGRAM COMPLETE
AR max vel: 1.14 cm2
AV Peak grad: 10.2 mmHg
Ao pk vel: 1.6 m/s
Area-P 1/2: 2.8 cm2
Height: 66 in
S' Lateral: 2.9 cm
Weight: 2299.84 [oz_av]

## 2024-03-10 MED ORDER — AMLODIPINE BESYLATE 10 MG PO TABS
10.0000 mg | ORAL_TABLET | Freq: Every day | ORAL | Status: DC
  Administered 2024-03-10: 10 mg via ORAL
  Filled 2024-03-10: qty 1

## 2024-03-10 MED ORDER — FOLIC ACID 1 MG PO TABS: 1.0000 mg | ORAL_TABLET | Freq: Every day | ORAL | 0 refills | Status: AC

## 2024-03-10 MED ORDER — VITAMIN B-12 1000 MCG PO TABS: 1000.0000 ug | ORAL_TABLET | Freq: Every day | ORAL | Status: DC

## 2024-03-10 MED ORDER — CYANOCOBALAMIN 1000 MCG/ML IJ SOLN
1000.0000 ug | Freq: Once | INTRAMUSCULAR | Status: AC
  Administered 2024-03-10: 1000 ug via INTRAMUSCULAR
  Filled 2024-03-10: qty 1

## 2024-03-10 MED ORDER — CYANOCOBALAMIN 1000 MCG PO TABS
1000.0000 ug | ORAL_TABLET | Freq: Every day | ORAL | 0 refills | Status: AC
Start: 2024-03-11 — End: ?

## 2024-03-10 MED ORDER — AMLODIPINE BESYLATE 10 MG PO TABS: 10.0000 mg | ORAL_TABLET | Freq: Every day | ORAL | 2 refills | Status: AC

## 2024-03-10 NOTE — Discharge Summary (Signed)
 Physician Discharge Summary  [name] FMW:[id number] DOB: 1945/12/14 DOA: 03/08/2024  PCP: Henry Ingle, MD  Admit date: 03/08/2024 Discharge date: 03/10/2024  Admitted From: Home Disposition: Home  Recommendations for Outpatient Follow-up:  Follow up with PCP in 1-2 weeks Please obtain BMP/CBC/magnesium  in one week   Home Health: N/A Equipment/Devices: N/A  Discharge Condition: Stable CODE STATUS: Full code Diet recommendation: Regular diet  Discharge summary: 78 year old with history of hypertension and hyperlipidemia, history of lung cancer status post lobectomy and chemotherapy who was at her clubhouse having dinner.  She was also having some alcohol.  She was noticed by her friends to be mumbling and after that she passed out completely.  No injury.  Parents were able to ease her down to the floor and called EMS.  On EMS arrival, patient was alert awake and oriented.  She was bradycardic with heart rate of 45-53. Sodium 126, creatinine 1.38, blood alcohol level 96.  Chest x-ray normal.  Patient was found with hypovolemia, hyponatremia and AKI.  She was admitted with cardiology consultation due to bradycardia.  Treated with IV fluids.  Improved.  Renal functions stabilized.  2D echocardiogram with grade 2 diastolic dysfunction.  Normal ejection fraction.  Syncope and collapse: Likely multifactorial.  Dehydration, hypovolemia, hyponatremia, AKI, use of alcohol. Orthostatic symptoms negative. Adequately improved. Echocardiogram without any valvular heart disease.  Normal ejection fraction. Clinically improved. Bradycardic with heart rate 40-50 with sinus bradycardia.  First-degree AV block.  Plan: Discontinue Cardizem and metoprolol .  Restarted back on amlodipine  for heart rate control.  Continue losartan . Sodium appropriately responded with IV fluid.  Patient with very poor oral intake at home.  Discussed in detail about improving oral intake, eating full meals with patient  and family.  Vitamin B12 deficiency: B12 level is 156.  She was given 1 dose of B12 intramuscular before discharge.  She was prescribed B12, 1000 mcg oral daily.  Will need to monitor levels in 1 month. Resume all other home medications.  Medically stable for discharge.   Discharge Diagnoses:  Principal Problem:   Syncope and collapse Active Problems:   Hyperlipidemia   History of lung cancer   Essential hypertension   Hyponatremia   First degree AV block   CKD (chronic kidney disease), stage II   Anemia of chronic disease    Discharge Instructions  Discharge Instructions     Diet general   Complete by: As directed    Increase activity slowly   Complete by: As directed       Allergies as of 03/10/2024   No Known Allergies      Medication List     STOP taking these medications    diltiazem 240 MG 24 hr capsule Commonly known as: CARDIZEM CD   metoprolol  succinate 100 MG 24 hr tablet Commonly known as: TOPROL -XL       TAKE these medications    alendronate 70 MG tablet Commonly known as: FOSAMAX Take 70 mg by mouth once a week.   amLODipine  10 MG tablet Commonly known as: NORVASC  Take 1 tablet (10 mg total) by mouth daily. Start taking on: March 11, 2024   cyanocobalamin  1000 MCG tablet Take 1 tablet (1,000 mcg total) by mouth daily. Start taking on: March 11, 2024   folic acid  1 MG tablet Commonly known as: FOLVITE  Take 1 tablet (1 mg total) by mouth daily. Start taking on: March 11, 2024   furosemide  20 MG tablet Commonly known as: Lasix  Take 1 tablet (20  mg total) by mouth daily. What changed: when to take this   Klor-Con  10 10 MEQ tablet Generic drug: potassium chloride  Take 10 mEq by mouth daily.   losartan  100 MG tablet Commonly known as: COZAAR  Take 1 tablet (100 mg total) by mouth daily.   multivitamin with minerals Tabs tablet Take 1 tablet by mouth daily.   PRESERVISION AREDS 2 PO Take 1 capsule by mouth in the morning and  at bedtime.   rosuvastatin  5 MG tablet Commonly known as: CRESTOR  Take 5 mg by mouth every evening.        No Known Allergies  Consultations: Cardiology   Procedures/Studies: ECHOCARDIOGRAM COMPLETE Result Date: 03/10/2024    ECHOCARDIOGRAM REPORT   Patient Name:   [name] [facility] Date of Exam: 03/10/2024 Medical Rec #:  [id number]        Height:       66.0 in Accession #:    [id number]       Weight:       142.0 lb Date of Birth:  1945-12-09        BSA:          1.729 m Patient Age:    78 years         BP:           174/69 mmHg Patient Gender: F                HR:           82 bpm. Exam Location:  Inpatient Procedure: 2D Echo, Cardiac Doppler and Color Doppler (Both Spectral and Color            Flow Doppler were utilized during procedure). Indications:    Syncope R55  History:        Patient has prior history of Echocardiogram examinations, most                 recent 09/21/2017. CKD, stage 2, Signs/Symptoms:Syncope; Risk                 Factors:Hypertension and Dyslipidemia.  Sonographer:    Thea Norlander RCS Referring Phys: GRAYCE BOLD IMPRESSIONS  1. Left ventricular ejection fraction, by estimation, is 65 to 70%. The left ventricle has normal function. The left ventricle has no regional wall motion abnormalities. Left ventricular diastolic parameters are consistent with Grade I diastolic dysfunction (impaired relaxation).  2. Right ventricular systolic function is normal. The right ventricular size is normal. Tricuspid regurgitation signal is inadequate for assessing PA pressure.  3. The mitral valve is normal in structure. No evidence of mitral valve regurgitation. No evidence of mitral stenosis.  4. The aortic valve is tricuspid. Aortic valve regurgitation is not visualized. Aortic valve sclerosis/calcification is present, without any evidence of aortic stenosis.  5. The inferior vena cava is normal in size with greater than 50% respiratory variability, suggesting right atrial pressure of  3 mmHg. FINDINGS  Left Ventricle: Left ventricular ejection fraction, by estimation, is 65 to 70%. The left ventricle has normal function. The left ventricle has no regional wall motion abnormalities. The left ventricular internal cavity size was normal in size. There is  no left ventricular hypertrophy. Left ventricular diastolic parameters are consistent with Grade I diastolic dysfunction (impaired relaxation). Normal left ventricular filling pressure. Right Ventricle: The right ventricular size is normal. No increase in right ventricular wall thickness. Right ventricular systolic function is normal. Tricuspid regurgitation signal is inadequate for assessing PA pressure. Left Atrium: Left atrial size  was normal in size. Right Atrium: Right atrial size was normal in size. Pericardium: There is no evidence of pericardial effusion. Mitral Valve: The mitral valve is normal in structure. No evidence of mitral valve regurgitation. No evidence of mitral valve stenosis. Tricuspid Valve: The tricuspid valve is normal in structure. Tricuspid valve regurgitation is trivial. No evidence of tricuspid stenosis. Aortic Valve: The aortic valve is tricuspid. Aortic valve regurgitation is not visualized. Aortic valve sclerosis/calcification is present, without any evidence of aortic stenosis. Aortic valve peak gradient measures 10.2 mmHg. Pulmonic Valve: The pulmonic valve was normal in structure. Pulmonic valve regurgitation is not visualized. No evidence of pulmonic stenosis. Aorta: The aortic root is normal in size and structure. Venous: The inferior vena cava is normal in size with greater than 50% respiratory variability, suggesting right atrial pressure of 3 mmHg. IAS/Shunts: No atrial level shunt detected by color flow Doppler.  LEFT VENTRICLE PLAX 2D LVIDd:         4.00 cm   Diastology LVIDs:         2.90 cm   LV e' medial:    6.74 cm/s LV PW:         1.00 cm   LV E/e' medial:  10.7 LV IVS:        0.80 cm   LV e' lateral:    7.83 cm/s LVOT diam:     1.60 cm   LV E/e' lateral: 9.2 LV SV:         38 LV SV Index:   22 LVOT Area:     2.01 cm  RIGHT VENTRICLE             IVC RV S prime:     15.80 cm/s  IVC diam: 1.00 cm TAPSE (M-mode): 2.1 cm LEFT ATRIUM             Index        RIGHT ATRIUM           Index LA diam:        2.90 cm 1.68 cm/m   RA Area:     12.60 cm LA Vol (A2C):   37.3 ml 21.57 ml/m  RA Volume:   27.20 ml  15.73 ml/m LA Vol (A4C):   34.1 ml 19.72 ml/m LA Biplane Vol: 35.7 ml 20.65 ml/m  AORTIC VALVE AV Area (Vmax): 1.14 cm AV Vmax:        160.00 cm/s AV Peak Grad:   10.2 mmHg LVOT Vmax:      90.70 cm/s LVOT Vmean:     58.700 cm/s LVOT VTI:       0.190 m  AORTA Ao Root diam: 2.60 cm Ao Asc diam:  2.70 cm MITRAL VALVE MV Area (PHT): 2.80 cm    SHUNTS MV Decel Time: 271 msec    Systemic VTI:  0.19 m MV E velocity: 71.80 cm/s  Systemic Diam: 1.60 cm MV A velocity: 95.90 cm/s MV E/A ratio:  0.75 Wilbert Bihari MD Electronically signed by Wilbert Bihari MD Signature Date/Time: 03/10/2024/11:34:48 AM    Final    DG Chest Port 1 View Result Date: 03/08/2024 EXAM: 1 VIEW XRAY OF THE CHEST 03/08/2024 10:30:00 PM COMPARISON: 07/13/2021, unchanged. Right costophrenic angle blunting. CLINICAL HISTORY: Syncope. FINDINGS: LUNGS AND PLEURA: No focal pulmonary opacity. No pulmonary edema. No pleural effusion. No pneumothorax. Right costophrenic angle blunting. HEART AND MEDIASTINUM: No acute abnormality of the cardiac and mediastinal silhouettes. BONES AND SOFT TISSUES: No acute osseous abnormality. IMPRESSION: 1.  No acute findings. 2. Unchanged right costophrenic angle blunting. Electronically signed by: Franky Stanford MD 03/08/2024 10:35 PM EDT RP Workstation: [id number]   (Echo, Carotid, EGD, Colonoscopy, ERCP)    Subjective: Patient seen in the morning rounds.  Friend at the bedside.  Denies any complaints.  Walked around.  No withdrawals.  Patient has agreed on door Dash bringing food to her so she will eat adequate.  Wants to go  home.   Discharge Exam: Vitals:   03/10/24 0440 03/10/24 0849  BP: (!) 174/69 (!) 156/72  Pulse: 64   Resp: 16 18  Temp: 97.7 F (36.5 C)   SpO2: 100%    Vitals:   03/09/24 2033 03/10/24 0145 03/10/24 0440 03/10/24 0849  BP: (!) 167/76  (!) 174/69 (!) 156/72  Pulse: 67  64   Resp: 18 18 16 18   Temp: 97.7 F (36.5 C) 98.5 F (36.9 C) 97.7 F (36.5 C)   TempSrc: Oral Oral Oral Oral  SpO2: 100%  100%   Weight:      Height:        General: Pt is alert, awake, not in acute distress Cardiovascular: RRR, S1/S2 +, no rubs, no gallops Respiratory: CTA bilaterally, no wheezing, no rhonchi Abdominal: Soft, NT, ND, bowel sounds + Extremities: no edema, no cyanosis    The results of significant diagnostics from this hospitalization (including imaging, microbiology, ancillary and laboratory) are listed below for reference.     Microbiology: No results found for this or any previous visit (from the past 240 hours).   Labs: BNP (last 3 results) No results for input(s): BNP in the last 8760 hours. Basic Metabolic Panel: Recent Labs  Lab 03/08/24 2226 03/09/24 0318 03/09/24 1637 03/10/24 0819  NA 126* 129* 130* 130*  K 3.9 3.8 4.5 3.7  CL 95* 97* 97* 99  CO2 20* 21* 24 23  GLUCOSE 103* 105* 105* 92  BUN 17 14 14 12   CREATININE 1.38* 1.14* 1.01* 1.11*  CALCIUM  8.8* 9.0 9.1 9.1   Liver Function Tests: Recent Labs  Lab 03/08/24 2226  AST 31  ALT 13  ALKPHOS 77  BILITOT 0.8  PROT 5.9*  ALBUMIN 4.0   No results for input(s): LIPASE, AMYLASE in the last 168 hours. No results for input(s): AMMONIA in the last 168 hours. CBC: Recent Labs  Lab 03/08/24 2226  WBC 6.8  NEUTROABS 3.9  HGB 12.5  HCT 36.5  MCV 100.3*  PLT 209   Cardiac Enzymes: No results for input(s): CKTOTAL, CKMB, CKMBINDEX, TROPONINI in the last 168 hours. BNP: Invalid input(s): POCBNP CBG: Recent Labs  Lab 03/09/24 2027  GLUCAP 115*   D-Dimer No results for  input(s): DDIMER in the last 72 hours. Hgb A1c No results for input(s): HGBA1C in the last 72 hours. Lipid Profile No results for input(s): CHOL, HDL, LDLCALC, TRIG, CHOLHDL, LDLDIRECT in the last 72 hours. Thyroid function studies No results for input(s): TSH, T4TOTAL, T3FREE, THYROIDAB in the last 72 hours.  Invalid input(s): FREET3 Anemia work up Recent Labs    03/09/24 1130  VITAMINB12 156*   Urinalysis    Component Value Date/Time   COLORURINE STRAW (A) 09/21/2017 0210   APPEARANCEUR CLEAR 09/21/2017 0210   LABSPEC 1.008 09/21/2017 0210   PHURINE 5.0 09/21/2017 0210   GLUCOSEU NEGATIVE 09/21/2017 0210   HGBUR SMALL (A) 09/21/2017 0210   BILIRUBINUR NEGATIVE 09/21/2017 0210   KETONESUR NEGATIVE 09/21/2017 0210   PROTEINUR NEGATIVE 09/21/2017 0210   UROBILINOGEN 0.2 07/03/2013 9160  NITRITE NEGATIVE 09/21/2017 0210   LEUKOCYTESUR NEGATIVE 09/21/2017 0210   Sepsis Labs Recent Labs  Lab 03/08/24 2226  WBC 6.8   Microbiology No results found for this or any previous visit (from the past 240 hours).   Time coordinating discharge: 35 minutes  SIGNED:   Renato Applebaum, MD  [facility] 03/10/2024, 1:42 PM

## 2024-03-10 NOTE — Care Management Obs Status (Signed)
 MEDICARE OBSERVATION STATUS NOTIFICATION   Patient Details  Name: [name] MRN: [id number] Date of Birth: [birth year]   Medicare Observation Status Notification Given:  Yes    Marval Gell, RN 03/10/2024, 7:32 AM

## 2024-03-10 NOTE — TOC Progression Note (Signed)
 Transition of Care [facility]) - Initial/Assessment Note    Patient Details  Name: [name] MRN: [id number] Date of Birth: 1946/04/09  [facility] CM/SW Contact:    Britt JULIANNA Bennetts, LCSW Phone Number: 03/10/2024, 2:21 PM  Clinical Narrative:                 CSW received consult for food insecurity and added resources to patient's discharge packet.    Patient Goals and CMS Choice            Expected Discharge Plan and Services         Expected Discharge Date: 03/10/24                                    Prior Living Arrangements/Services                       Activities of Daily Living   ADL Screening (condition at time of admission) Independently performs ADLs?: Yes (appropriate for developmental age) Is the patient deaf or have difficulty hearing?: No Does the patient have difficulty seeing, even when wearing glasses/contacts?: No Does the patient have difficulty concentrating, remembering, or making decisions?: No  Permission Sought/Granted                  Emotional Assessment              Admission diagnosis:  Syncope and collapse [R55] Patient Active Problem List   Diagnosis Date Noted   First degree AV block 03/09/2024   CKD (chronic kidney disease), stage II 03/09/2024   Anemia of chronic disease 03/09/2024   Hypokalemia 09/21/2017   Hyponatremia 09/21/2017   Tibial plateau fracture 09/21/2017   Postural dizziness with presyncope 09/21/2017   Pleural effusion on right 04/05/2017   Aortic atherosclerosis (HCC)    Syncope and collapse 05/10/2016   Essential hypertension 05/10/2016   Restless legs syndrome 05/10/2016   History of lung cancer 07/09/2013   Hyperlipidemia    Hypertensive heart disease without CHF    Lung cancer, Right upper lobe    PCP:  Henry Ingle, MD Pharmacy:   [id number] - [place], [place] - [address] [place] [address] Phone: [phone]  Fax: [phone]     Social Drivers of Health (SDOH) Social History: SDOH Screenings   Food Insecurity: No Food Insecurity (03/09/2024)  Housing: Low Risk  (03/09/2024)  Transportation Needs: No Transportation Needs (03/09/2024)  Utilities: Not At Risk (03/09/2024)  Social Connections: Unknown (03/09/2024)  Tobacco Use: Medium Risk (03/09/2024)   SDOH Interventions:     Readmission Risk Interventions     No data to display

## 2024-03-10 NOTE — Progress Notes (Signed)
 Echocardiogram 2D Echocardiogram has been performed.  [name] 03/10/2024, 11:37 AM

## 2024-03-10 NOTE — Plan of Care (Signed)

## 2024-03-10 NOTE — Progress Notes (Signed)
 Pt discharged home with family in stable condition. Discharge instructions given. Scripts sent to pharmacy of choice. No immediate questions or concerns at this time and pt verbalized understanding. Discharged from unit via wheelchair

## 2024-03-10 NOTE — Progress Notes (Signed)
 Mobility Specialist Progress Note:   03/10/24 1020  Mobility  Activity Ambulated with assistance  Level of Assistance Contact guard assist, steadying assist  Assistive Device None  Distance Ambulated (ft) 500 ft  Activity Response Tolerated well  Mobility Referral Yes  Mobility visit 1 Mobility  Mobility Specialist Start Time (ACUTE ONLY) 1020  Mobility Specialist Stop Time (ACUTE ONLY) 1030  Mobility Specialist Time Calculation (min) (ACUTE ONLY) 10 min   Pt agreeable to mobility session. Required only minG assist to ambulate in hallway. Denies any symptoms, No overt unsteadiness noted. Pt back in room with all needs met.  Therisa Rana Mobility Specialist Please contact via SecureChat or  Rehab office at [phone]

## 2024-03-10 NOTE — Progress Notes (Signed)
 Subjective:  Denies SSCP, palpitations or Dyspnea No postural symptoms BP a bit elevated Wants to go home   Objective:  Vitals:   03/09/24 1607 03/09/24 2033 03/10/24 0145 03/10/24 0440  BP: (!) 177/77 (!) 167/76  (!) 174/69  Pulse: (!) 59 67  64  Resp: (P) 18 18 18 16   Temp: (P) 99 F (37.2 C) 97.7 F (36.5 C) 98.5 F (36.9 C) 97.7 F (36.5 C)  TempSrc: (P) Oral Oral Oral Oral  SpO2: 100% 100%  100%  Weight: (P) 65.2 kg     Height: (P) 5' 6 (1.676 m)       Intake/Output from previous day:  Intake/Output Summary (Last 24 hours) at 03/10/2024 0758 Last data filed at 03/09/2024 9094 Gross per 24 hour  Intake 389.58 ml  Output --  Net 389.58 ml    Physical Exam:  Affect appropriate Healthy:  appears stated age HEENT: normal Neck supple with no adenopathy JVP normal no bruits no thyromegaly Lungs clear prior right lobectomy  Heart:  S1/S2 no murmur, no rub, gallop or click PMI normal Abdomen: benighn, BS positve, no tenderness, no AAA no bruit.  No HSM or HJR Distal pulses intact with no bruits No edema Neuro non-focal Skin warm and dry No muscular weakness   Lab Results: Basic Metabolic Panel: Recent Labs    03/09/24 0318 03/09/24 1637  NA 129* 130*  K 3.8 4.5  CL 97* 97*  CO2 21* 24  GLUCOSE 105* 105*  BUN 14 14  CREATININE 1.14* 1.01*  CALCIUM  9.0 9.1   Liver Function Tests: Recent Labs    03/08/24 2226  AST 31  ALT 13  ALKPHOS 77  BILITOT 0.8  PROT 5.9*  ALBUMIN 4.0   No results for input(s): LIPASE, AMYLASE in the last 72 hours. CBC: Recent Labs    03/08/24 2226  WBC 6.8  NEUTROABS 3.9  HGB 12.5  HCT 36.5  MCV 100.3*  PLT 209     Imaging: DG Chest Port 1 View Result Date: 03/08/2024 EXAM: 1 VIEW XRAY OF THE CHEST 03/08/2024 10:30:00 PM COMPARISON: 07/13/2021, unchanged. Right costophrenic angle blunting. CLINICAL HISTORY: Syncope. FINDINGS: LUNGS AND PLEURA: No focal pulmonary opacity. No pulmonary edema. No pleural  effusion. No pneumothorax. Right costophrenic angle blunting. HEART AND MEDIASTINUM: No acute abnormality of the cardiac and mediastinal silhouettes. BONES AND SOFT TISSUES: No acute osseous abnormality. IMPRESSION: 1. No acute findings. 2. Unchanged right costophrenic angle blunting. Electronically signed by: Franky Stanford MD 03/08/2024 10:35 PM EDT RP Workstation: [id number]    Cardiac Studies:  ECG: SB rate 54 normal    Telemetry:  NSR no AV block no arrhythmia  Echo: pending  Medications:    folic acid   1 mg Oral Daily   heparin   5,000 Units Subcutaneous Q8H   losartan   100 mg Oral Daily   rosuvastatin   5 mg Oral QPM   sodium chloride  flush  3 mL Intravenous Q12H   sodium chloride  flush  3 mL Intravenous Q12H   thiamine   100 mg Oral Daily        Assessment/Plan:   Sycnope:  likely vasovagal in setting of ETOH. Prior ? Vasovagal syncope in 2018. D/c lopressor  as she is bradycardic  Resume home lasix  and losartan  for BP.  Add norvasc  10 mg for BP Have contacted echo tech to do TTE this am. Outpatient monitor arranged can be d/c latter today after echo   [name] 03/10/2024, 7:58 AM

## 2024-04-16 ENCOUNTER — Ambulatory Visit: Attending: Cardiology

## 2024-04-16 DIAGNOSIS — R55 Syncope and collapse: Secondary | ICD-10-CM

## 2024-04-17 DIAGNOSIS — R55 Syncope and collapse: Secondary | ICD-10-CM | POA: Diagnosis not present

## 2024-04-18 ENCOUNTER — Ambulatory Visit: Payer: Self-pay | Admitting: Cardiology

## 2024-04-18 NOTE — Telephone Encounter (Signed)
-----   Message from Rollo FABIENE Louder sent at 04/18/2024 11:50 AM EDT ----- Please tell patient that their recent cardiac monitor showed predominantly normal sinus rhythm with average HR 77 BPM. There were rare PVCs (extra beats, not dangerous and a normal finding). One 10  second run of fast HR. No findings on monitor to suggest a cause for her loss of consciousness   Thanks KJ ----- Message ----- From: Delford Maude BROCKS, MD Sent: 04/17/2024   7:56 AM EDT To: Rollo JONELLE Louder, PA-C

## 2024-04-19 ENCOUNTER — Ambulatory Visit: Admitting: Cardiology

## 2024-04-21 NOTE — Progress Notes (Deleted)
  Cardiology Office Note   Date:  04/21/2024  ID:  [name], DOB 1945/11/25, MRN [id number] PCP: Henry Ingle, MD  [facility] Providers Cardiologist:  Maude Emmer, MD   History of Present Illness [name] Below is a 78 y.o. female with a history of lung cancer s/p right lobectomy/lymph node resection/chemotherapy, hypertension, hyperlipidemia.  Patient recently establish care with Dr. Emmer and presents today for hospital follow-up.  Patient was recently admitted 8/1 - 8/3 after she presented with a syncopal episode.  Patient's friend reported that they were all sitting, had 1 peer and 1 fireball and then she felt dizzy.  She asked for water, mumbled, then passed out completely.  Noted to have jerking motion and urination.  Underwent echocardiogram on 03/10/2024 that showed EF 65-70%, no regional wall motion abnormalities, grade 1 DD, normal RV systolic function, no significant valvular abnormalities.  Ultimately felt like her syncope and collapse was multifactorial due to dehydration, hyponatremia, AKI, use of alcohol.  She had bradycardia during her admission and Cardizem metoprolol  was stopped.  She was discharged with a cardiac monitor that showed predominantly normal sinus rhythm with heart rate 77 bpm, rare PVCs, 10-second run of atrial tach versus flutter.  Syncope  - Patient was recently mated 8/08/15/28 after syncopal episode.  It was suspected that episode was multifactorial due to dehydration and alcohol use.  Noted to have AKI that admission - Echocardiogram 03/2024 with EF 65 to 70%, no wall motion abnormalities, no significant valve abnormalities - Cardiac monitor showed normal sinus rhythm with average heart rate 77 bpm, rare PVCs  HTN -  - Continue amlodipine  10 mg daily, losartan  100 mg daily - BMP for medication monitoring  HLD  - Lipid panel *** - Continue crestor  5 mg daily    ROS: ***  Studies Reviewed      *** Risk  Assessment/Calculations {Does this patient have ATRIAL FIBRILLATION?:[phone]} No BP recorded.  {Refresh Note OR Click here to enter BP  :1}***       Physical Exam VS:  There were no vitals taken for this visit.       Wt Readings from Last 3 Encounters:  03/09/24 (P) 143 lb 11.8 oz (65.2 kg)  05/03/22 141 lb 4.8 oz (64.1 kg)  01/30/20 143 lb (64.9 kg)    GEN: Well nourished, well developed in no acute distress NECK: No JVD; No carotid bruits CARDIAC: ***RRR, no murmurs, rubs, gallops RESPIRATORY:  Clear to auscultation without rales, wheezing or rhonchi  ABDOMEN: Soft, non-tender, non-distended EXTREMITIES:  No edema; No deformity   ASSESSMENT AND PLAN ***    {Are you ordering a CV Procedure (e.g. stress test, cath, DCCV, TEE, etc)?   Press F2        :[id number]}  Dispo: ***  Signed, Rollo FABIENE Louder, PA-C

## 2024-04-25 ENCOUNTER — Ambulatory Visit: Admitting: Cardiology

## 2024-04-29 ENCOUNTER — Ambulatory Visit: Admitting: Cardiology

## 2024-05-05 NOTE — Progress Notes (Unsigned)
 Cardiology Office Note   Date:  05/06/2024  ID:  [name], DOB Jul 14, 1946, MRN [id number] PCP: Henry Ingle, MD  [facility] Providers Cardiologist:  Maude Emmer, MD   History of Present Illness [name] is a 78 y.o. female with a history of lung cancer s/p right lobectomy/lymph node resection/chemotherapy, hypertension, hyperlipidemia.  Patient recently establish care with Dr. Emmer and presents today for hospital follow-up.  Patient was recently admitted 8/1 - 8/3 after she presented with a syncopal episode.  Patient's friend reported that they were all sitting, had 1 peer and 1 fireball and then she felt dizzy.  She asked for water, mumbled, then passed out completely.  Noted to have jerking motion and urination.  Underwent echocardiogram on 03/10/2024 that showed EF 65-70%, no regional wall motion abnormalities, grade 1 DD, normal RV systolic function, no significant valvular abnormalities.  Ultimately felt like her syncope and collapse was multifactorial due to dehydration, hyponatremia, AKI, use of alcohol.  She had bradycardia during her admission and Cardizem metoprolol  was stopped.  She was discharged with a cardiac monitor that showed predominantly normal sinus rhythm with heart rate 77 bpm, rare PVCs, 10-second run of atrial tach versus flutter.  Patient comes in today with a friend and feels great. No chest pain, palpitations, dyspnea, edema. BP up today, hasn't taken her meds yet. It came down quickly. Eating a lot of frozen meals.   ROS:    Studies Reviewed      Monitor 04/2024 NSR average HR 77 bpm PVC;s < 1% total beats 10 second run ? Atrial tach vs flutter  No AV block or severe bradycardia lowest HR recorded 54 bpm   Maude Emmer MD [facility]  Echo 03/2024 IMPRESSIONS     1. Left ventricular ejection fraction, by estimation, is 65 to 70%. The  left ventricle has normal function. The left ventricle has no regional  wall motion abnormalities.  Left ventricular diastolic parameters are  consistent with Grade I diastolic  dysfunction (impaired relaxation).   2. Right ventricular systolic function is normal. The right ventricular  size is normal. Tricuspid regurgitation signal is inadequate for assessing  PA pressure.   3. The mitral valve is normal in structure. No evidence of mitral valve  regurgitation. No evidence of mitral stenosis.   4. The aortic valve is tricuspid. Aortic valve regurgitation is not  visualized. Aortic valve sclerosis/calcification is present, without any  evidence of aortic stenosis.   5. The inferior vena cava is normal in size with greater than 50%  respiratory variability, suggesting right atrial pressure of 3 mmHg.    Risk Assessment/Calculations           Physical Exam VS:  BP 130/70   Pulse 79   Ht 5' 3 (1.6 m)   Wt 144 lb 9.6 oz (65.6 kg)   SpO2 98%   BMI 25.61 kg/m        Wt Readings from Last 3 Encounters:  05/06/24 144 lb 9.6 oz (65.6 kg)  03/09/24 (P) 143 lb 11.8 oz (65.2 kg)  05/03/22 141 lb 4.8 oz (64.1 kg)    GEN: Well nourished, well developed in no acute distress NECK: No JVD; No carotid bruits CARDIAC:  RRR, no murmurs, rubs, gallops RESPIRATORY:  Clear to auscultation without rales, wheezing or rhonchi  ABDOMEN: Soft, non-tender, non-distended EXTREMITIES:  No edema; No deformity   ASSESSMENT AND PLAN Syncope  - Patient was recently in hosp 8/08/15/28 after syncopal episode.  It  was suspected that episode was multifactorial due to dehydration and alcohol use.  Noted to have AKI that admission - Echocardiogram 03/2024 with EF 65 to 70%, no wall motion abnormalities, no significant valve abnormalities - Cardiac monitor showed normal sinus rhythm with average heart rate 77 bpm, rare PVCs -check bmet today  HTN - BP up initially but came down - Continue amlodipine  10 mg daily, losartan  100 mg daily - BMP for medication monitoring -continue to hold dilt and  metoprolol   HLD  - Lipid panel today - Continue crestor  5 mg daily        Dispo: f/u in 6 months  Signed, Olivia Pavy, PA-C

## 2024-05-06 ENCOUNTER — Ambulatory Visit: Admitting: Cardiology

## 2024-05-06 ENCOUNTER — Ambulatory Visit: Attending: Physician Assistant | Admitting: Physician Assistant

## 2024-05-06 ENCOUNTER — Encounter: Payer: Self-pay | Admitting: Physician Assistant

## 2024-05-06 VITALS — BP 130/70 | HR 79 | Ht 63.0 in | Wt 144.6 lb

## 2024-05-06 DIAGNOSIS — R55 Syncope and collapse: Secondary | ICD-10-CM | POA: Diagnosis not present

## 2024-05-06 DIAGNOSIS — E785 Hyperlipidemia, unspecified: Secondary | ICD-10-CM

## 2024-05-06 DIAGNOSIS — I119 Hypertensive heart disease without heart failure: Secondary | ICD-10-CM

## 2024-05-06 NOTE — Patient Instructions (Signed)
 Medication Instructions:  Your physician recommends that you continue on your current medications as directed. Please refer to the Current Medication list given to you today.  *If you need a refill on your cardiac medications before your next appointment, please call your pharmacy*  Lab Work: BMET, [facility] If you have labs (blood work) drawn today and your tests are completely normal, you will receive your results only by: MyChart Message (if you have MyChart) OR A paper copy in the mail If you have any lab test that is abnormal or we need to change your treatment, we will call you to review the results.  Follow-Up: At [facility], you and your health needs are our priority.  As part of our continuing mission to provide you with exceptional heart care, our providers are all part of one team.  This team includes your primary Cardiologist (physician) and Advanced Practice Providers or APPs (Physician Assistants and Nurse Practitioners) who all work together to provide you with the care you need, when you need it.  Your next appointment:   6 month(s)  Provider:   Maude Emmer, MD     Two Gram Sodium Diet 2000 mg  What is Sodium? Sodium is a mineral found naturally in many foods. The most significant source of sodium in the diet is table salt, which is about 40% sodium.  Processed, convenience, and preserved foods also contain a large amount of sodium.  The body needs only 500 mg of sodium daily to function,  A normal diet provides more than enough sodium even if you do not use salt.  Why Limit Sodium? A build up of sodium in the body can cause thirst, increased blood pressure, shortness of breath, and water retention.  Decreasing sodium in the diet can reduce edema and risk of heart attack or stroke associated with high blood pressure.  Keep in mind that there are many other factors involved in these health problems.  Heredity, obesity, lack of exercise, cigarette smoking, stress  and what you eat all play a role.  General Guidelines: Do not add salt at the table or in cooking.  One teaspoon of salt contains over 2 grams of sodium. Read food labels Avoid processed and convenience foods Ask your dietitian before eating any foods not dicussed in the menu planning guidelines Consult your physician if you wish to use a salt substitute or a sodium containing medication such as antacids.  Limit milk and milk products to 16 oz (2 cups) per day.  Shopping Hints: READ LABELS!! Dietetic does not necessarily mean low sodium. Salt and other sodium ingredients are often added to foods during processing.    Menu Planning Guidelines Food Group Choose More Often Avoid  Beverages (see also the milk group All fruit juices, low-sodium, salt-free vegetables juices, low-sodium carbonated beverages Regular vegetable or tomato juices, commercially softened water used for drinking or cooking  Breads and Cereals Enriched white, wheat, rye and pumpernickel bread, hard rolls and dinner rolls; muffins, cornbread and waffles; most dry cereals, cooked cereal without added salt; unsalted crackers and breadsticks; low sodium or homemade bread crumbs Bread, rolls and crackers with salted tops; quick breads; instant hot cereals; pancakes; commercial bread stuffing; self-rising flower and biscuit mixes; regular bread crumbs or cracker crumbs  Desserts and Sweets Desserts and sweets mad with mild should be within allowance Instant pudding mixes and cake mixes  Fats Butter or margarine; vegetable oils; unsalted salad dressings, regular salad dressings limited to 1 Tbs; light, sour  and heavy cream Regular salad dressings containing bacon fat, bacon bits, and salt pork; snack dips made with instant soup mixes or processed cheese; salted nuts  Fruits Most fresh, frozen and canned fruits Fruits processed with salt or sodium-containing ingredient (some dried fruits are processed with sodium sulfites         Vegetables Fresh, frozen vegetables and low- sodium canned vegetables Regular canned vegetables, sauerkraut, pickled vegetables, and others prepared in brine; frozen vegetables in sauces; vegetables seasoned with ham, bacon or salt pork  Condiments, Sauces, Miscellaneous  Salt substitute with physician's approval; pepper, herbs, spices; vinegar, lemon or lime juice; hot pepper sauce; garlic powder, onion powder, low sodium soy sauce (1 Tbs.); low sodium condiments (ketchup, chili sauce, mustard) in limited amounts (1 tsp.) fresh ground horseradish; unsalted tortilla chips, pretzels, potato chips, popcorn, salsa (1/4 cup) Any seasoning made with salt including garlic salt, celery salt, onion salt, and seasoned salt; sea salt, rock salt, kosher salt; meat tenderizers; monosodium glutamate; mustard, regular soy sauce, barbecue, sauce, chili sauce, teriyaki sauce, steak sauce, Worcestershire sauce, and most flavored vinegars; canned gravy and mixes; regular condiments; salted snack foods, olives, picles, relish, horseradish sauce, catsup   Food preparation: Try these seasonings Meats:    Pork Sage, onion Serve with applesauce  Chicken Poultry seasoning, thyme, parsley Serve with cranberry sauce  Lamb Curry powder, rosemary, garlic, thyme Serve with mint sauce or jelly  Veal Marjoram, basil Serve with current jelly, cranberry sauce  Beef Pepper, bay leaf Serve with dry mustard, unsalted chive butter  Fish [place] leaf, dill Serve with unsalted lemon butter, unsalted parsley butter  Vegetables:    Asparagus Lemon juice   Broccoli Lemon juice   Carrots Mustard dressing parsley, mint, nutmeg, glazed with unsalted butter and sugar   Green beans Marjoram, lemon juice, nutmeg,dill seed   Tomatoes Basil, marjoram, onion   Spice /blend for [employer]  4 tsp ground thyme 1 tsp ground sage 3 tsp ground rosemary 4 tsp ground marjoram   Test your knowledge A product that says Salt Free may still  contain sodium. True or False Garlic Powder and Hot Pepper Sauce an be used as alternative seasonings.True or False Processed foods have more sodium than fresh foods.  True or False Canned Vegetables have less sodium than froze True or False   WAYS TO DECREASE YOUR SODIUM INTAKE Avoid the use of added salt in cooking and at the table.  Table salt (and other prepared seasonings which contain salt) is probably one of the greatest sources of sodium in the diet.  Unsalted foods can gain flavor from the sweet, sour, and butter taste sensations of herbs and spices.  Instead of using salt for seasoning, try the following seasonings with the foods listed.  Remember: how you use them to enhance natural food flavors is limited only by your creativity... Allspice-Meat, fish, eggs, fruit, peas, red and yellow vegetables Almond Extract-Fruit baked goods Anise Seed-Sweet breads, fruit, carrots, beets, cottage cheese, cookies (tastes like licorice) Basil-Meat, fish, eggs, vegetables, rice, vegetables salads, soups, sauces Bay Leaf-Meat, fish, stews, poultry Burnet-Salad, vegetables (cucumber-like flavor) Caraway Seed-Bread, cookies, cottage cheese, meat, vegetables, cheese, rice Cardamon-Baked goods, fruit, soups Celery Powder or seed-Salads, salad dressings, sauces, meatloaf, soup, bread.Do not use  celery salt Chervil-Meats, salads, fish, eggs, vegetables, cottage cheese (parsley-like flavor) Chili Power-Meatloaf, chicken cheese, corn, eggplant, egg dishes Chives-Salads cottage cheese, egg dishes, soups, vegetables, sauces Cilantro-Salsa, casseroles Cinnamon-Baked goods, fruit, pork, lamb, chicken, carrots Cloves-Fruit, baked goods, fish, pot  roast, green beans, beets, carrots Coriander-Pastry, cookies, meat, salads, cheese (lemon-orange flavor) Cumin-Meatloaf, fish,cheese, eggs, cabbage,fruit pie (caraway flavor) [employer], fruit, eggs, fish, poultry, cottage cheese, vegetables Dill Seed-Meat,  cottage cheese, poultry, vegetables, fish, salads, bread Fennel Seed-Bread, cookies, apples, pork, eggs, fish, beets, cabbage, cheese, Licorice-like flavor Garlic-(buds or powder) Salads, meat, poultry, fish, bread, butter, vegetables, potatoes.Do not  use garlic salt Ginger-Fruit, vegetables, baked goods, meat, fish, poultry Horseradish Root-Meet, vegetables, butter Lemon Juice or Extract-Vegetables, fruit, tea, baked goods, fish salads Mace-Baked goods fruit, vegetables, fish, poultry (taste like nutmeg) Maple Extract-Syrups Marjoram-Meat, chicken, fish, vegetables, breads, green salads (taste like Sage) Mint-Tea, lamb, sherbet, vegetables, desserts, carrots, cabbage Mustard, Dry or Seed-Cheese, eggs, meats, vegetables, poultry Nutmeg-Baked goods, fruit, chicken, eggs, vegetables, desserts Onion Powder-Meat, fish, poultry, vegetables, cheese, eggs, bread, rice salads (Do not use   Onion salt) Orange Extract-Desserts, baked goods Oregano-Pasta, eggs, cheese, onions, pork, lamb, fish, chicken, vegetables, green salads Paprika-Meat, fish, poultry, eggs, cheese, vegetables Parsley Flakes-Butter, vegetables, meat fish, poultry, eggs, bread, salads (certain forms may   Contain sodium Pepper-Meat fish, poultry, vegetables, eggs Peppermint Extract-Desserts, baked goods Poppy Seed-Eggs, bread, cheese, fruit dressings, baked goods, noodles, vegetables, cottage  [employer], poultry, meat, fish, cauliflower, turnips,eggs bread Saffron-Rice, bread, veal, chicken, fish, eggs Sage-Meat, fish, poultry, onions, eggplant, tomateos, pork, stews Savory-Eggs, salads, poultry, meat, rice, vegetables, soups, pork Tarragon-Meat, poultry, fish, eggs, butter, vegetables (licorice-like flavor)  Thyme-Meat, poultry, fish, eggs, vegetables, (clover-like flavor), sauces, soups Tumeric-Salads, butter, eggs, fish, rice, vegetables (saffron-like flavor) Vanilla Extract-Baked  goods, candy Vinegar-Salads, vegetables, meat marinades Walnut Extract-baked goods, candy   2. Choose your Foods Wisely   The following is a list of foods to avoid which are high in sodium:  Meats-Avoid all smoked, canned, salt cured, dried and kosher meat and fish as well as Anchovies   [employer] meats:Bologna, Liverwurst, Pastrami Canned meat or fish  Marinated herring Caviar    Pepperoni Corned Beef   Pizza Dried chipped beef  Salami Frozen breaded fish or meat Salt pork Frankfurters or hot dogs  Sardines Gefilte fish   Sausage Ham (boiled ham, Proscuitto Smoked butt    spiced ham)   Spam      TV Dinners Vegetables Canned vegetables (Regular) Relish Canned mushrooms  Sauerkraut Olives    Tomato juice Pickles  Bakery and Dessert Products Canned puddings  Cream pies Cheesecake   Decorated cakes Cookies  Beverages/Juices Tomato juice, regular  Gatorade   V-8 vegetable juice, regular  Breads and Cereals Biscuit mixes   Salted potato chips, corn chips, pretzels Bread stuffing mixes  Salted crackers and rolls Pancake and waffle mixes Self-rising flour  Seasonings Accent    Meat sauces Barbecue sauce  Meat tenderizer Catsup    Monosodium glutamate (MSG) Celery salt   Onion salt Chili sauce   Prepared mustard Garlic salt   Salt, seasoned salt, sea salt Gravy mixes   Soy sauce Horseradish   Steak sauce Ketchup   Tartar sauce Lite salt    Teriyaki sauce Marinade mixes   Worcestershire sauce  Others Baking powder   Cocoa and cocoa mixes Baking soda   Commercial casserole mixes Candy-caramels, chocolate  Dehydrated soups    Bars, fudge,nougats  Instant rice and pasta mixes Canned broth or soup  Maraschino cherries Cheese, aged and processed cheese and cheese spreads  Learning Assessment Quiz  Indicated T (for True) or F (for False) for each of the following statements:  _____  Fresh fruits and vegetables and unprocessed grains are generally low in  sodium _____ Water may contain a considerable amount of sodium, depending on the source _____ You can always tell if a food is high in sodium by tasting it _____ Certain laxatives my be high in sodium and should be avoided unless prescribed   by a physician or pharmacist _____ Salt substitutes may be used freely by anyone on a sodium restricted diet _____ Sodium is present in table salt, food additives and as a natural component of   most foods _____ Table salt is approximately 90% sodium _____ Limiting sodium intake may help prevent excess fluid accumulation in the body _____ On a sodium-restricted diet, seasonings such as bouillon soy sauce, and    cooking wine should be used in place of table salt _____ On an ingredient list, a product which lists monosodium glutamate as the first   ingredient is an appropriate food to include on a low sodium diet  Circle the best answer(s) to the following statements (Hint: there may be more than one correct answer)  11. On a low-sodium diet, some acceptable snack items are:    A. Olives  F. Bean dip   K. Grapefruit juice    B. Salted Pretzels G. Commercial Popcorn   L. Canned peaches    C. Carrot Sticks  H. Bouillon   M. Unsalted nuts   D. [place] fries  I. Peanut butter crackers N. Salami   E. Sweet pickles J. Tomato Juice   O. Pizza  12.  Seasonings that may be used freely on a reduced - sodium diet include   A. Lemon wedges F.Monosodium glutamate K. Celery seed    B.Soysauce   G. Pepper   L. Mustard powder   C. Sea salt  H. Cooking wine  M. Onion flakes   D. Vinegar  E. Prepared horseradish N. Salsa   E. Sage   J. Worcestershire sauce  O. Chutney

## 2024-05-07 ENCOUNTER — Ambulatory Visit: Payer: Self-pay | Admitting: Physician Assistant

## 2024-05-07 LAB — LIPID PANEL
Chol/HDL Ratio: 1.8 ratio (ref 0.0–4.4)
Cholesterol, Total: 199 mg/dL (ref 100–199)
HDL: 109 mg/dL (ref >39)
LDL Chol Calc (NIH): 75 mg/dL (ref 0–99)
Triglycerides: 83 mg/dL (ref 0–149)
VLDL Cholesterol Cal: 15 mg/dL (ref 5–40)

## 2024-05-07 LAB — BASIC METABOLIC PANEL WITH GFR
BUN/Creatinine Ratio: 15 (ref 12–28)
BUN: 16 mg/dL (ref 8–27)
CO2: 21 mmol/L (ref 20–29)
Calcium: 9.7 mg/dL (ref 8.7–10.3)
Chloride: 97 mmol/L (ref 96–106)
Creatinine, Ser: 1.07 mg/dL — ABNORMAL HIGH (ref 0.57–1.00)
Glucose: 81 mg/dL (ref 70–99)
Potassium: 5.1 mmol/L (ref 3.5–5.2)
Sodium: 134 mmol/L (ref 134–144)
eGFR: 53 mL/min/1.73 — ABNORMAL LOW (ref >59)
